# Patient Record
Sex: Female | Born: 1965
Health system: Southern US, Community
[De-identification: ages and names within clinical notes are randomized; demographics above are authoritative.]

## PROBLEM LIST (undated history)

## (undated) DIAGNOSIS — I493 Ventricular premature depolarization: Principal | ICD-10-CM

## (undated) DIAGNOSIS — F419 Anxiety disorder, unspecified: Secondary | ICD-10-CM

## (undated) DIAGNOSIS — K219 Gastro-esophageal reflux disease without esophagitis: Secondary | ICD-10-CM

## (undated) DIAGNOSIS — R112 Nausea with vomiting, unspecified: Secondary | ICD-10-CM

## (undated) DIAGNOSIS — N3941 Urge incontinence: Secondary | ICD-10-CM

## (undated) DIAGNOSIS — N3010 Interstitial cystitis (chronic) without hematuria: Secondary | ICD-10-CM

## (undated) DIAGNOSIS — IMO0002 Reserved for concepts with insufficient information to code with codable children: Secondary | ICD-10-CM

## (undated) DIAGNOSIS — R002 Palpitations: Principal | ICD-10-CM

## (undated) DIAGNOSIS — G20A1 Parkinson's disease without dyskinesia, without mention of fluctuations: Secondary | ICD-10-CM

## (undated) DIAGNOSIS — G43909 Migraine, unspecified, not intractable, without status migrainosus: Secondary | ICD-10-CM

## (undated) DIAGNOSIS — M79605 Pain in left leg: Secondary | ICD-10-CM

## (undated) DIAGNOSIS — K649 Unspecified hemorrhoids: Secondary | ICD-10-CM

## (undated) DIAGNOSIS — R0789 Other chest pain: Secondary | ICD-10-CM

## (undated) DIAGNOSIS — G20 Parkinson's disease: Secondary | ICD-10-CM

## (undated) DIAGNOSIS — J302 Other seasonal allergic rhinitis: Secondary | ICD-10-CM

## (undated) DIAGNOSIS — Z9889 Other specified postprocedural states: Secondary | ICD-10-CM

## (undated) HISTORY — DX: Unspecified hemorrhoids: K64.9

## (undated) HISTORY — DX: Other seasonal allergic rhinitis: J30.2

## (undated) HISTORY — PX: BREAST ENHANCEMENT SURGERY: SHX7

## (undated) HISTORY — DX: Parkinson's disease without dyskinesia, without mention of fluctuations: G20.A1

## (undated) HISTORY — DX: Anxiety disorder, unspecified: F41.9

## (undated) HISTORY — DX: Other chest pain: R07.89

## (undated) HISTORY — DX: Palpitations: R00.2

## (undated) HISTORY — DX: Parkinson's disease: G20

## (undated) HISTORY — DX: Ventricular premature depolarization: I49.3

## (undated) HISTORY — PX: OTHER SURGICAL HISTORY: SHX169

## (undated) HISTORY — DX: Interstitial cystitis (chronic) without hematuria: N30.10

## (undated) HISTORY — PX: LASIK: SHX215

## (undated) HISTORY — PX: TONSILLECTOMY: SUR1361

## (undated) HISTORY — DX: Reserved for concepts with insufficient information to code with codable children: IMO0002

---

## 1999-03-28 ENCOUNTER — Other Ambulatory Visit: Admission: RE | Admit: 1999-03-28 | Discharge: 1999-03-28 | Payer: Self-pay | Admitting: Gynecology

## 1999-06-07 ENCOUNTER — Other Ambulatory Visit: Admission: RE | Admit: 1999-06-07 | Discharge: 1999-06-07 | Payer: Self-pay | Admitting: Gynecology

## 2000-03-16 ENCOUNTER — Other Ambulatory Visit: Admission: RE | Admit: 2000-03-16 | Discharge: 2000-03-16 | Payer: Self-pay | Admitting: Gynecology

## 2002-04-25 ENCOUNTER — Other Ambulatory Visit: Admission: RE | Admit: 2002-04-25 | Discharge: 2002-04-25 | Payer: Self-pay | Admitting: Gynecology

## 2002-09-08 ENCOUNTER — Inpatient Hospital Stay (HOSPITAL_COMMUNITY): Admission: AD | Admit: 2002-09-08 | Discharge: 2002-09-08 | Payer: Self-pay | Admitting: Gynecology

## 2002-09-19 ENCOUNTER — Inpatient Hospital Stay (HOSPITAL_COMMUNITY): Admission: AD | Admit: 2002-09-19 | Discharge: 2002-09-19 | Payer: Self-pay | Admitting: Gynecology

## 2002-10-09 ENCOUNTER — Inpatient Hospital Stay (HOSPITAL_COMMUNITY): Admission: AD | Admit: 2002-10-09 | Discharge: 2002-10-09 | Payer: Self-pay | Admitting: Gynecology

## 2002-12-30 ENCOUNTER — Encounter: Admission: RE | Admit: 2002-12-30 | Discharge: 2002-12-30 | Payer: Self-pay | Admitting: Gynecology

## 2003-01-02 ENCOUNTER — Encounter: Admission: RE | Admit: 2003-01-02 | Discharge: 2003-01-02 | Payer: Self-pay | Admitting: Gynecology

## 2003-01-06 ENCOUNTER — Encounter: Admission: RE | Admit: 2003-01-06 | Discharge: 2003-01-06 | Payer: Self-pay | Admitting: Gynecology

## 2003-01-09 ENCOUNTER — Encounter: Admission: RE | Admit: 2003-01-09 | Discharge: 2003-01-09 | Payer: Self-pay | Admitting: Gynecology

## 2003-01-13 ENCOUNTER — Encounter: Admission: RE | Admit: 2003-01-13 | Discharge: 2003-01-13 | Payer: Self-pay | Admitting: Gynecology

## 2003-01-16 ENCOUNTER — Encounter: Admission: RE | Admit: 2003-01-16 | Discharge: 2003-01-16 | Payer: Self-pay | Admitting: Gynecology

## 2003-01-20 ENCOUNTER — Encounter: Admission: RE | Admit: 2003-01-20 | Discharge: 2003-01-20 | Payer: Self-pay | Admitting: Gynecology

## 2003-01-23 ENCOUNTER — Encounter: Admission: RE | Admit: 2003-01-23 | Discharge: 2003-01-23 | Payer: Self-pay | Admitting: Gynecology

## 2003-01-26 ENCOUNTER — Inpatient Hospital Stay (HOSPITAL_COMMUNITY): Admission: AD | Admit: 2003-01-26 | Discharge: 2003-01-29 | Payer: Self-pay | Admitting: Gynecology

## 2003-01-26 ENCOUNTER — Encounter (INDEPENDENT_AMBULATORY_CARE_PROVIDER_SITE_OTHER): Payer: Self-pay | Admitting: Specialist

## 2003-03-10 ENCOUNTER — Other Ambulatory Visit: Admission: RE | Admit: 2003-03-10 | Discharge: 2003-03-10 | Payer: Self-pay | Admitting: Gynecology

## 2004-04-21 ENCOUNTER — Other Ambulatory Visit: Admission: RE | Admit: 2004-04-21 | Discharge: 2004-04-21 | Payer: Self-pay | Admitting: Gynecology

## 2004-12-19 ENCOUNTER — Ambulatory Visit (HOSPITAL_COMMUNITY): Admission: RE | Admit: 2004-12-19 | Discharge: 2004-12-19 | Payer: Self-pay | Admitting: Urology

## 2004-12-19 ENCOUNTER — Ambulatory Visit (HOSPITAL_BASED_OUTPATIENT_CLINIC_OR_DEPARTMENT_OTHER): Admission: RE | Admit: 2004-12-19 | Discharge: 2004-12-19 | Payer: Self-pay | Admitting: Urology

## 2005-04-26 ENCOUNTER — Other Ambulatory Visit: Admission: RE | Admit: 2005-04-26 | Discharge: 2005-04-26 | Payer: Self-pay | Admitting: Gynecology

## 2005-06-09 ENCOUNTER — Encounter: Admission: RE | Admit: 2005-06-09 | Discharge: 2005-06-09 | Payer: Self-pay | Admitting: Gynecology

## 2006-04-30 ENCOUNTER — Other Ambulatory Visit: Admission: RE | Admit: 2006-04-30 | Discharge: 2006-04-30 | Payer: Self-pay | Admitting: Gynecology

## 2006-06-08 ENCOUNTER — Encounter: Admission: RE | Admit: 2006-06-08 | Discharge: 2006-09-06 | Payer: Self-pay | Admitting: Gynecology

## 2007-05-08 ENCOUNTER — Other Ambulatory Visit: Admission: RE | Admit: 2007-05-08 | Discharge: 2007-05-08 | Payer: Self-pay | Admitting: Gynecology

## 2007-08-14 ENCOUNTER — Encounter: Admission: RE | Admit: 2007-08-14 | Discharge: 2007-08-14 | Payer: Self-pay | Admitting: Gynecology

## 2008-06-01 ENCOUNTER — Other Ambulatory Visit: Admission: RE | Admit: 2008-06-01 | Discharge: 2008-06-01 | Payer: Self-pay | Admitting: Obstetrics & Gynecology

## 2008-09-10 ENCOUNTER — Ambulatory Visit (HOSPITAL_COMMUNITY): Admission: RE | Admit: 2008-09-10 | Discharge: 2008-09-10 | Payer: Self-pay | Admitting: Obstetrics & Gynecology

## 2009-06-09 ENCOUNTER — Other Ambulatory Visit: Admission: RE | Admit: 2009-06-09 | Discharge: 2009-06-09 | Payer: Self-pay | Admitting: Gynecology

## 2009-06-09 ENCOUNTER — Ambulatory Visit: Payer: Self-pay | Admitting: Gynecology

## 2009-11-22 ENCOUNTER — Ambulatory Visit (HOSPITAL_COMMUNITY): Admission: RE | Admit: 2009-11-22 | Discharge: 2009-11-22 | Payer: Self-pay | Admitting: Obstetrics and Gynecology

## 2010-04-11 ENCOUNTER — Other Ambulatory Visit: Admission: RE | Admit: 2010-04-11 | Discharge: 2010-04-11 | Payer: Self-pay | Admitting: Gynecology

## 2010-04-11 ENCOUNTER — Ambulatory Visit: Payer: Self-pay | Admitting: Gynecology

## 2010-07-24 ENCOUNTER — Encounter: Payer: Self-pay | Admitting: Internal Medicine

## 2010-11-18 NOTE — H&P (Signed)
NAME:  Kristina Lewis, Kristina Lewis                          ACCOUNT NO.:  [id number]   MEDICAL RECORD NO.:  [id number]                   PATIENT TYPE:  INP   LOCATION:  NA                                   FACILITY:  [facility]   PHYSICIAN:  Ivor Costa. Farrel Gobble, M.D.              DATE OF BIRTH:  July 11, 1965   DATE OF ADMISSION:  DATE OF DISCHARGE:                                HISTORY & PHYSICAL   CHIEF COMPLAINT:  Thirty-six-plus weeks twins for an elective repeat  cesarean section.   HISTORY OF PRESENT ILLNESS:  The patient is a 45 year old G3 P1-0-1-1 with a  history of a previous cesarean section for breech who has spontaneous  diamniotic dichorionic twins who presents for an elective cesarean section  for twin gestation.  The patient's pregnancy has been complicated by  advanced maternal age for which she did not have an amniocentesis.  She was  also noted since 24 weeks to have a markedly shortened cervix that had been  noted to be 2.4.  The patient was placed on bedrest and has had multiple  ultrasounds to look at her cervix along with estimated fetal weights and she  was noted to have variable cervical length between 1.5 and 2.4 cm.  The  patient, however, denies any contractions.  Her cervix on her last  ultrasound was 1.8 cm and closed without any funneling.  She also has  discordance between the twins although they are dichorionic.  Twin A who  is female was estimated to be in the 55th percentile, Twin B in the 30th  percentile - both using the twin charts.  The patient otherwise is without  complaints.  She reports good fetal movement, no vaginal bleeding, and no  pressure.  She also has a history of positive GBS.  Her LMP was May 17, 2002.  Her estimated date of confinement is February 22, 2003.   PRENATAL LABORATORY DATA:  She is A positive, antibody negative.  RPR  nonreactive.  Rubella immune.  Hepatitis B surface antigen nonreactive.  HIV  nonreactive.  AFP was within normal limits.   Glucola was normal at 104.   Please refer to the [facility].   PHYSICAL EXAMINATION:  GENERAL:  She is a well-appearing gravida in no acute  distress.  HEART:  Regular rate.  LUNGS:  Clear to auscultation.  ABDOMEN:  Gravid, soft, nontender.  Heart tones were auscultated.  PELVIC:  Vaginal exam was deferred.  However, cervix is 1.8 cm and closed by  ultrasound.  EXTREMITIES:  No pedal edema.   ASSESSMENT:  Thirty-six and one-seventh weeks twins with shortened cervix  since 24 weeks who presents now for an elective repeat cesarean section.  The patient does not desire a bilateral tubal ligation.  All questions were  addressed and she will present electively for surgery.  Should she have  signs or symptoms of labor prior to this  point then she will present for  section at that time.  Of note, both babies are breech.                                               Ivor Costa. Farrel Gobble, M.D.    THL/MEDQ  D:  01/21/2003  T:  01/21/2003  Job:  [id number]

## 2010-11-18 NOTE — Op Note (Signed)
Kristina Lewis, Kristina Lewis                ACCOUNT NO.:  [id number]   MEDICAL RECORD NO.:  [id number]          PATIENT TYPE:  AMB   LOCATION:  [facility]                         FACILITY:  [facility]   PHYSICIAN:  Mark C. Vernie Ammons, M.D.  DATE OF BIRTH:  [birth year]   DATE OF PROCEDURE:  12/19/2004  DATE OF DISCHARGE:                                 OPERATIVE REPORT   PREOPERATIVE DIAGNOSES:  Interstitial cystitis.   POSTOPERATIVE DIAGNOSES:  Interstitial cystitis.   PROCEDURE:  Cystoscopy with urethral dilatation and hydrodistention.   SURGEON:  Mark C. Vernie Ammons, M.D.   ANESTHESIA:  General.   BLOOD LOSS:  Less than 1 mL.   DRAINS:  None.   SPECIMENS:  None.   COMPLICATIONS:  None.   INDICATIONS:  The patient is a 45 year old white female who I have seen for  interstitial cystitis for some time. She has urinary frequency as her  primary symptom and has tried anticholinergics, but these cause significant  drying effects of her eyes and nose. She has significant nocturia as well.  Her urine is negative and she has undergone cystoscopy and hydrodistention  in the past with good result. I discussed with her treatment options and she  has and elected to proceed with hydrodistention at this time.   DESCRIPTION OF OPERATION:  After informed consent, the patient was brought  to the major OR, placed on the table, administered general anesthesia and  then moved to the  dorsal lithotomy position. Her genitalia and vagina were  sterilely prepped and draped. Initial evaluation revealed a rather tight  urethra. I dilated from 49 [place] to 24 [place] without difficulty or  bleeding and then placed a 22 French cystoscope with 12 degrees lens in the  bladder. The bladder was fully inspect and noted to be free of any tumor,  stones or inflammatory lesions. There is mild squamous trigonal metaplasia,  ureteral orifices normal configuration and position.   At 100 cmH2O pressure, the bladder was initially filled  to capacity. It was  then drained and all terminal effluent was almost clear. The bladder volume  was only 300 mL. I therefore inserted a 22 French Foley catheter and  refilled the bladder to 100 cmH2O and left it filled for approximately five  minutes. After that time, I then drained the bladder and noted the capacity  was now 400 mL. Reinspection cytoscopically revealed significant  glomerulations and petechial hemorrhages.   I then injected Kenalog and 0.25% Marcaine periurethrally and in the  subcarinal region. Kenalog, Pyridium and 0.25% Marcaine were also instilled  in the bladder. Vaginal packing was left in place and will be removed in the  recovery room.   The patient tolerated procedure well with no intraoperative complications.   I gave her a prescription for 40 Tylox and 40 Pyridium plus. She will return  to my office in four weeks to discuss additional medical management of her  condition.       MCO/MEDQ  D:  12/19/2004  T:  12/19/2004  Job:  [id number]

## 2010-11-18 NOTE — Op Note (Signed)
Kristina Lewis, Kristina Lewis                          ACCOUNT NO.:  [id number]   MEDICAL RECORD NO.:  [id number]                   PATIENT TYPE:  INP   LOCATION:  9199                                 FACILITY:  [facility]   PHYSICIAN:  Ivor Costa. Farrel Gobble, M.D.              DATE OF BIRTH:  [birth year]   DATE OF PROCEDURE:  01/26/2003  DATE OF DISCHARGE:                                 OPERATIVE REPORT   PREOPERATIVE DIAGNOSES:  1. Spontaneous twin discordant growth.  2. Previous cesarean section.  3. Shortened cervix.   POSTOPERATIVE DIAGNOSES:  1. Spontaneous twin discordant growth.  2. Previous cesarean section.  3. Shortened cervix.   PROCEDURE:  Repeat cesarean section, low flap transverse.   SURGEON:  Ivor Costa. Farrel Gobble, M.D.   ASSISTANTMarcial Pacas P. Fontaine, M.D.   ANESTHESIA:  Spinal anesthesia.   INTRAVENOUS FLUIDS:  3100 mL of lactated Ringers.   ESTIMATED BLOOD LOSS:  600 mL.   URINE OUTPUT:  100 mL of clear.   FINDINGS:  A viable female infant in the frank breech presentation, low in the  pelvis, delivered footling.  Birth weight was 6 pounds 5 ounces.  Apgars 8  and 9.  Clear amniotic fluid.  Baby B was a viable female in the frank  breech presentation.  Clear amniotic fluid.  Birth weight 6 pounds 10  ounces.  Apgars 8 and 9.  There were extensive pelvic adhesions.   COMPLICATIONS:  None.   PATHOLOGY:  Placentae.   DESCRIPTION OF PROCEDURE:  The patient was taken to the operating room where  spinal anesthesia was induced and placed in the supine position with left  lateral displacement.  She was prepped and draped in the usual sterile  fashion.  After adequate anesthesia was insured, a Pfannenstiel skin  incision was made with the scalpel going through the previous C-section  scar.  The incision was  carried through the underlying layer of fascia with  electrocautery.  The fascia was scored in the midline and the incision was  extended laterally with electrocautery over  a Kelly clamp.  The inferior  aspect of the fascial incision was grasped with Kochers.  The underlying  rectus muscles were dissected off sharply.  They were noted to be markedly  scarred.  In a similar fashion, the superior aspect of the incision was also  grasped with Kochers and underlying rectus muscles were dissected off.  They  were also noted to be markedly scarred.  The rectus muscles were separated  in the midline.  They were also markedly scarred in the midline and they  needed to be extended superiorly and inferiorly sharply.  What was felt to  be translucent area of the peritoneum was elevated and tented up.  However,  once we entered this space, we felt that perhaps we were creating the  bladder flap.  The reattempt to find the peritoneal cavity, however,  was  unsuccessful because of the extensive scarring.  The muscles were scored  slightly with cautery for approximately 2 cm on each side and this allowed  [name] more movement. The bladder blade was inserted and indeed the  vesicouterine peritoneum had been entered.  We extended this incision.  The  scalpel was used to make a transverse incision on the uterus.  The lower  segment was noted to be markedly thin.  The incision was extended laterally  with the bandage scissors.  Amniotomy was performed.  The frank breech was  deep in the pelvis and could not be completely elevated.  Therefore, leg was  advanced through the incision and the infant was delivered from the footling  breech presentation.  With the usual maneuvers, cord was cut and clamped and  handed off to the awaiting pediatricians.  Cord bloods were obtained.  The  infant was handed off to the awaiting pediatricians.  The sac of the second  twin was then visualized.  She was also felt to be in the frank breech  presentation.  Amniotomy was performed and she was delivered frank breech  with the usual maneuvers.  The cord bloods were obtained for the second  twin.  We  were unable to massage the uterus internally because of extensive  adhesions of the uterus to the anterior abdominal wall.  The placentae were  therefore manually extracted.  The uterus was clear of all clots and debris.  The uterine incision was repaired with a running locked layer of 0 chromic.  There were no extensions.  The pelvis was irrigated.  We were unable to  visualize much into the gutters.  We were unable to visualize the adnexa  secondary to scarring.  However, as everything was hemostatic, we elected to  not continue that.  The muscles were inspected and noted to be hemostatic as  was the peritoneum.  The fascia was closed with 0 Vicryl starting at the  apex and moving toward the midline bilaterally.  The subcu was irrigated and  reapproximated with interrupted 2-0 plain.  The keloid aspect of the  incision was sharply dissected off.  The incision was then closed with a  running layer of 4-0 Vicryl on a [place].  Because of the tape allergy, a  binder was then placed.  The patient tolerated the procedure well.  Sponge,  lap and needle counts correct x 2.  She was transferred to the PACU in  stable condition.                                               Ivor Costa. Farrel Gobble, M.D.    THL/MEDQ  D:  01/26/2003  T:  01/26/2003  Job:  [id number]

## 2010-11-18 NOTE — Discharge Summary (Signed)
   Kristina Lewis, Kristina Lewis                          ACCOUNT NO.:  [id number]   MEDICAL RECORD NO.:  [id number]                   PATIENT TYPE:  INP   LOCATION:  9124                                 FACILITY:  [facility]   PHYSICIAN:  Ivor Costa. Farrel Gobble, M.D.              DATE OF BIRTH:  13-Dec-1965   DATE OF ADMISSION:  01/26/2003  DATE OF DISCHARGE:  01/29/2003                                 DISCHARGE SUMMARY   DISCHARGE DIAGNOSES:  1. Intrauterine pregnancy at 36+ weeks.  2. Shortened cervix.  3. Twin gestation, discordant growth.  4. History of prior cesarean section, for repeat cesarean section.  5. Advanced maternal age.  6. Status post repeat cesarean section, low flap transverse, by Ivor Costa.     Farrel Gobble, M.D., on January 26, 2003.  7. Positive group B Streptococcus.   HISTORY:  A 45 year old female, gravida 3, para 1, with an EDC of February 22, 2003.  Prenatal course was uncomplicated with a history of prior cesarean,  desires repeat cesarean section, also with spontaneous twin gestation with  advanced maternal age.  Declined amniocentesis but a positive group B strep.  During pregnancy she was noted at 24 weeks to have a markedly shortened  cervix to be 2.4 cm, placed on bedrest, and multiple ultrasounds to follow  serial weights as well as ultrasound.  It was noted, however, there was  discordant growth of the twins; therefore, the patient was admitted for  surgery.   HOSPITAL COURSE:  On January 26, 2003, the patient was admitted and underwent a  repeat cesarean section, low flap transverse, by Dr. Douglass Rivers and  underwent delivery of a female, Apgars of 8 and 9, a weight of 4 pounds 10  ounces, and a female, Apgars of 8 and 9, a weight of 6 pounds 5 ounces,  without complication.  Postoperatively the patient did have a significant  amount of itching attributed to her PCA pump; therefore, the medication was  changed to Dilaudid and the PCA pump with seemed to significantly help.  Otherwise, she stayed afebrile, voiding, and in stable condition, and she  was thought in satisfactory condition to discharge to home on January 29, 2003,  and given [facility] Gynecology postpartum instructions.   ACCESSORY CLINICAL FINDINGS:  The patient is A-positive, rubella immune.  On  January 27, 2003, hemoglobin was 10.8.   DISPOSITION:  The patient is discharged to home to return to the office in  six weeks.  Any problem prior to that, can be seen in the office, and  received Tylox p.r.n. pain.     Susa Loffler, P.A.                    Ivor Costa. Farrel Gobble, M.D.    TSG/MEDQ  D:  02/13/2003  T:  02/14/2003  Job:  [id number]

## 2011-04-04 ENCOUNTER — Other Ambulatory Visit: Payer: Self-pay | Admitting: Gynecology

## 2011-04-04 DIAGNOSIS — Z139 Encounter for screening, unspecified: Secondary | ICD-10-CM

## 2011-04-13 ENCOUNTER — Ambulatory Visit (HOSPITAL_COMMUNITY): Payer: Self-pay

## 2011-07-18 ENCOUNTER — Ambulatory Visit (HOSPITAL_COMMUNITY): Payer: BC Managed Care – PPO

## 2011-07-18 ENCOUNTER — Other Ambulatory Visit: Payer: Self-pay | Admitting: Gynecology

## 2011-07-18 DIAGNOSIS — Z139 Encounter for screening, unspecified: Secondary | ICD-10-CM

## 2011-07-24 ENCOUNTER — Ambulatory Visit (HOSPITAL_COMMUNITY): Payer: BC Managed Care – PPO

## 2011-07-25 ENCOUNTER — Ambulatory Visit (HOSPITAL_COMMUNITY): Payer: BC Managed Care – PPO

## 2011-07-28 ENCOUNTER — Ambulatory Visit (HOSPITAL_COMMUNITY): Payer: BC Managed Care – PPO

## 2011-08-08 ENCOUNTER — Ambulatory Visit (HOSPITAL_COMMUNITY)
Admission: RE | Admit: 2011-08-08 | Discharge: 2011-08-08 | Disposition: A | Discharge status: Home / Self Care | Payer: BC Managed Care – PPO | Source: Ambulatory Visit | Attending: Gynecology | Admitting: Gynecology

## 2011-08-08 DIAGNOSIS — Z1231 Encounter for screening mammogram for malignant neoplasm of breast: Secondary | ICD-10-CM | POA: Insufficient documentation

## 2011-08-08 DIAGNOSIS — Z139 Encounter for screening, unspecified: Secondary | ICD-10-CM

## 2011-08-17 ENCOUNTER — Ambulatory Visit (INDEPENDENT_AMBULATORY_CARE_PROVIDER_SITE_OTHER): Payer: BC Managed Care – PPO | Admitting: Gynecology

## 2011-08-17 ENCOUNTER — Encounter: Payer: Self-pay | Admitting: Gynecology

## 2011-08-17 VITALS — BP 106/68 | Ht 63.5 in | Wt 124.0 lb

## 2011-08-17 DIAGNOSIS — Z833 Family history of diabetes mellitus: Secondary | ICD-10-CM

## 2011-08-17 DIAGNOSIS — Z0000 Encounter for general adult medical examination without abnormal findings: Secondary | ICD-10-CM

## 2011-08-17 DIAGNOSIS — Z01419 Encounter for gynecological examination (general) (routine) without abnormal findings: Secondary | ICD-10-CM

## 2011-08-17 DIAGNOSIS — N898 Other specified noninflammatory disorders of vagina: Secondary | ICD-10-CM

## 2011-08-17 DIAGNOSIS — N3010 Interstitial cystitis (chronic) without hematuria: Secondary | ICD-10-CM | POA: Insufficient documentation

## 2011-08-17 DIAGNOSIS — R634 Abnormal weight loss: Secondary | ICD-10-CM

## 2011-08-17 LAB — WET PREP FOR TRICH, YEAST, CLUE: Yeast Wet Prep HPF POC: NONE SEEN

## 2011-08-17 LAB — URINALYSIS W MICROSCOPIC + REFLEX CULTURE
Bilirubin Urine: NEGATIVE
Glucose, UA: NEGATIVE mg/dL
Ketones, ur: NEGATIVE mg/dL
Protein, ur: NEGATIVE mg/dL
Urobilinogen, UA: 0.2 mg/dL (ref 0.0–1.0)

## 2011-08-17 MED ORDER — CLINDAMYCIN PHOSPHATE 2 % VA CREA: 1.0000 | TOPICAL_CREAM | Freq: Every day | VAGINAL | Status: AC

## 2011-08-17 NOTE — Patient Instructions (Signed)
Osteoporosis Osteoporosis is a disease of the bones that makes them weaker and prone to break (fracture). By their mid-30s, most people begin to gradually lose bone strength. If this is severe enough, osteoporosis may occur. Osteopenia is a less severe weakness of the bones, which places you at risk for osteoporosis. It is important to identify if you have osteoporosis or osteopenia. Bone fractures from osteoporosis (especially hip and spine fractures) are a major cause of hospitalization, loss of independence, and can lead to life-threatening complications. CAUSES  There are a number of causes and risk factors:  Gender. Women are at a higher risk for osteoporosis than men.   Age. Bone formation slows down with age.   Ethnicity. For unclear reasons, white and Asian women are at higher risk for osteoporosis. Hispanic and African American women are at increased, but lesser, risk.   Family history of osteoporosis can mean that you are at a higher risk for getting it.   History of bone fractures indicates you may be at higher risk of another.   Calcium is very important for bone health and strength. Not enough calcium in your diet increases your risk for osteoporosis. Vitamin D is important for calcium metabolism. You get vitamin D from sunlight, foods, or supplements.   Physical activity. Bones get stronger with weight-bearing exercise and weaker without use.   Smoking is associated with decreased bone strength.   Medicines. Cortisone medicines, too much thyroid medicine, some cancer and seizure medicines, and others can weaken bones and cause osteoporosis.   Decreased body weight is associated with osteoporosis. The small amount of estrogen-type molecules produced in fat cells seems to protect the bones.   Menopausal decrease in the hormone estrogen can cause osteoporosis.   Low levels of the hormone testosterone can cause osteoporosis.   Some medical conditions can lead to osteoporosis  (hyperthyroidism, hyperparathyroidism, B12 deficiency).  SYMPTOMS  Usually, no symptoms are felt as the bones weaken. The first symptoms are generally related to bone fractures. You may have silent, tiny bone fractures, especially in your spine. This can cause height loss and forward bending of the spine (kyphosis). DIAGNOSIS  You or your caregiver may suspect osteoporosis based on height loss and kyphosis. Osteoporosis or osteopenia may be identified on an X-ray done for other reasons. A bone density measurement will likely be taken. Your bones are often measured at your lower spine or your hips. Measurement is done by an X-ray called a DEXA scan, or sometimes by a computerized X-ray scan (CT or CAT scan). Other tests may be done to find the cause of osteoporosis, such as blood tests to measure calcium and vitamin D, or to monitor treatment.

## 2011-08-17 NOTE — Progress Notes (Signed)
Kristina Lewis 21-Jun-1966 [id number]   History:    46 y.o.  for annual exam with history of Mirena IUD placed in August of 2008 not have any menstrual periods. Review of patient's records indicated she was weighing 148 down to 124 which she attributes to exercise a need better. Patient was complaining of vulvar pruritus she been treated recently for an upper as per tract infection and subsequently developed what she thought was a yeast infection and her primary physician and given her Diflucan patient with some relief but still has some vulvar irritation.  Past medical history,surgical history, family history and social history were all reviewed and documented in the EPIC chart.  Gynecologic History No LMP recorded. Patient is not currently having periods (Reason: IUD). Contraception: IUD Last Pap: 2011. Results were: normal Last mammogram: February 2013. Results were: Normal   Obstetric History OB History    Grav Para Term Preterm Abortions TAB SAB Ect Mult Living   3 2 2      1 3      # Outc Date GA Lbr Len/2nd Wgt Sex Del Anes PTL Lv   1A TRM     F CS  No Yes   1B      M CS  No Yes   2 TRM     M CS  No Yes   3 GRA                ROS:  Was performed and pertinent positives and negatives are included in the history.  Exam: chaperone present  BP 106/68  Ht 5' 3.5" (1.613 m)  Wt 124 lb (56.246 kg)  BMI 21.62 kg/m2  Body mass index is 21.62 kg/(m^2).  General appearance : Well developed well nourished female. No acute distress HEENT: Neck supple, trachea midline, no carotid bruits, no thyroidmegaly Lungs: Clear to auscultation, no rhonchi or wheezes, or rib retractions  Heart: Regular rate and rhythm, no murmurs or gallops Breast:Examined in sitting and supine position were symmetrical in appearance, no palpable masses or tenderness,  no skin retraction, no nipple inversion, no nipple discharge, no skin discoloration, no axillary or supraclavicular lymphadenopathy Abdomen: no  palpable masses or tenderness, no rebound or guarding Extremities: no edema or skin discoloration or tenderness  Pelvic:  Bartholin, Urethra, Skene Glands: Within normal limits             Vagina: No gross lesions or discharge  Cervix: No gross lesions or discharge  Uterus  retroverted, normal size, shape and consistency, non-tender and mobile  Adnexa  Without masses or tenderness  Anus and perineum  normal   Rectovaginal  normal sphincter tone without palpated masses or tenderness             Hemoccult not done   Wet prep moderate bacteria  Assessment/Plan:  46 y.o. female for annual exam . We discussed new screening guidelines her Pap smears. Her next Pap smear will be next year. She was encouraged to do her monthly self breast examination. She will be placed on Cleocin vaginal cream to apply each bedtime for 5 days. We discussed importance of calcium and vitamin D and regular exercise for osteoporosis prevention. Literature information was provided osteoporosis prevention. Patient with strong family history diabetes for this reason we'll be checking fasting blood sugar alone the fasting lipid profile TSH CBC and urinalysis. Patient is to return back to the office in July to have her IUD changed.    Ok Edwards MD, 10:24 AM  08/17/2011     

## 2011-08-18 LAB — LIPID PANEL
Cholesterol: 211 mg/dL — ABNORMAL HIGH (ref 0–200)
HDL: 106 mg/dL (ref >39)
Total CHOL/HDL Ratio: 2 Ratio
Triglycerides: 52 mg/dL (ref <150)
VLDL: 10 mg/dL (ref 0–40)

## 2011-08-18 LAB — CBC WITH DIFFERENTIAL/PLATELET
Basophils Absolute: 0 10*3/uL (ref 0.0–0.1)
Basophils Relative: 0 % (ref 0–1)
Eosinophils Absolute: 0 10*3/uL (ref 0.0–0.7)
Eosinophils Relative: 1 % (ref 0–5)
HCT: 44.8 % (ref 36.0–46.0)
MCH: 31.5 pg (ref 26.0–34.0)
MCHC: 31.9 g/dL (ref 30.0–36.0)
MCV: 98.7 fL (ref 78.0–100.0)
Monocytes Absolute: 0.4 10*3/uL (ref 0.1–1.0)
RDW: 13.4 % (ref 11.5–15.5)

## 2011-08-18 LAB — TSH: TSH: 2.665 u[IU]/mL (ref 0.350–4.500)

## 2011-10-09 ENCOUNTER — Telehealth: Payer: Self-pay | Admitting: *Deleted

## 2011-10-09 NOTE — Telephone Encounter (Signed)
Please call in Diflucan to take one tablet today then one tablet on Wednesday with one refill.

## 2011-10-09 NOTE — Telephone Encounter (Signed)
Pt called c/o yeast infection from last OV. Pt was given Cleocin vaginal cream to apply each bedtime for 5 days, pt took all medication and itching, white discharge still there. Pt cant make OV due to sick child at home. Please advise

## 2011-10-09 NOTE — Telephone Encounter (Signed)
Pt informed with the below note, rx called into pharmacy. [phone]

## 2011-11-24 ENCOUNTER — Telehealth: Payer: Self-pay | Admitting: *Deleted

## 2011-11-24 MED ORDER — FLUCONAZOLE 200 MG PO TABS: 200.0000 mg | ORAL_TABLET | Freq: Every day | ORAL | Status: DC

## 2011-11-24 MED ORDER — CLINDAMYCIN PHOSPHATE 2 % VA CREA: TOPICAL_CREAM | VAGINAL | Status: DC

## 2011-11-24 NOTE — Telephone Encounter (Signed)
Pt calling c/o recurrent yeast infection x 3 days now, pt has treatment on feb 2013 with Cleocin vaginal cream to apply each bedtime for 5 days. And then on diflucan on April 2013, pt would just like something to help treat for weekend only, OV made for 11/28/10. Please advise

## 2011-11-24 NOTE — Telephone Encounter (Signed)
Pt informed with the below note, rx sent to pharmacy. 

## 2011-11-24 NOTE — Telephone Encounter (Signed)
Please call in Diflucan qoomg one PO and Cleocin vaginal cream qhs for 5 days.

## 2011-11-28 ENCOUNTER — Encounter: Payer: Self-pay | Admitting: Gynecology

## 2011-11-28 ENCOUNTER — Ambulatory Visit (INDEPENDENT_AMBULATORY_CARE_PROVIDER_SITE_OTHER): Payer: BC Managed Care – PPO | Admitting: Gynecology

## 2011-11-28 VITALS — BP 110/70

## 2011-11-28 DIAGNOSIS — L293 Anogenital pruritus, unspecified: Secondary | ICD-10-CM

## 2011-11-28 DIAGNOSIS — R300 Dysuria: Secondary | ICD-10-CM

## 2011-11-28 DIAGNOSIS — N951 Menopausal and female climacteric states: Secondary | ICD-10-CM

## 2011-11-28 DIAGNOSIS — N898 Other specified noninflammatory disorders of vagina: Secondary | ICD-10-CM

## 2011-11-28 LAB — URINALYSIS W MICROSCOPIC + REFLEX CULTURE
Bilirubin Urine: NEGATIVE
Glucose, UA: NEGATIVE mg/dL
Hgb urine dipstick: NEGATIVE
Protein, ur: NEGATIVE mg/dL
pH: 7.5 (ref 5.0–8.0)

## 2011-11-28 LAB — WET PREP FOR TRICH, YEAST, CLUE
Clue Cells Wet Prep HPF POC: NONE SEEN
WBC, Wet Prep HPF POC: NONE SEEN
Yeast Wet Prep HPF POC: NONE SEEN

## 2011-11-28 LAB — FOLLICLE STIMULATING HORMONE: FSH: 10 m[IU]/mL

## 2011-11-28 MED ORDER — METRONIDAZOLE 500 MG PO TABS: 500.0000 mg | ORAL_TABLET | Freq: Two times a day (BID) | ORAL | Status: AC

## 2011-11-28 NOTE — Progress Notes (Addendum)
Patient 46 year old who presented to the office today complaining of leukorrhea suprapubic discomfort and vulvar irritation along with frequency and dysuria. Patient is being followed by the urologist for her interstitial cystitis and her overactive bladder for which she was recently started on some anticholinergic that she does not recall the name. She denies any fever chills nausea or vomiting. She is complaining of night sweats as well as weight be informed and temperature instability. She has had a Mirena IUD in place since August of 2008.  Exam: Abdomen: Soft nontender no rebound or guarding Pelvic: Bartholin urethra Skene glands within normal limits : Vagina vaginal gel noted in the vagina Cervix: IUD string not seen Uterus slightly anteverted no palpable masses or tenderness Rectal: Not examined  Assessment/plan: Patient complaining of leukorrhea a mucus-like discharge. We discussed that sometimes this may happen with patient with IUD. She would like to have it removed. We did not see the IUD string today. She will return back to the office later in the week for ultrasound and to remove the IUD at the same time. The wet prep today just demonstrated too numerous to count bacteria. A GC and Chlamydia culture was obtained pending at time of this dictation. We'll also check her TSH as well as an FSH and random blood sugar today. When she returns this week for the ultrasound we'll discuss these results and manage accordingly. She will be started on Flagyl 500 mg to take 1 by mouth twice a day for 5 days.  Addendum: Urinalysis was negative. Urine culture pending at time of this dictation.

## 2011-11-28 NOTE — Patient Instructions (Signed)

## 2011-11-29 ENCOUNTER — Other Ambulatory Visit: Payer: Self-pay | Admitting: Gynecology

## 2011-11-29 DIAGNOSIS — T8339XA Other mechanical complication of intrauterine contraceptive device, initial encounter: Secondary | ICD-10-CM

## 2011-11-29 LAB — GC/CHLAMYDIA PROBE AMP, GENITAL: GC Probe Amp, Genital: NEGATIVE

## 2011-11-30 ENCOUNTER — Ambulatory Visit (INDEPENDENT_AMBULATORY_CARE_PROVIDER_SITE_OTHER): Payer: BC Managed Care – PPO

## 2011-11-30 ENCOUNTER — Other Ambulatory Visit: Payer: Self-pay | Admitting: Gynecology

## 2011-11-30 ENCOUNTER — Ambulatory Visit (INDEPENDENT_AMBULATORY_CARE_PROVIDER_SITE_OTHER): Payer: BC Managed Care – PPO | Admitting: Gynecology

## 2011-11-30 DIAGNOSIS — Z309 Encounter for contraceptive management, unspecified: Secondary | ICD-10-CM

## 2011-11-30 DIAGNOSIS — N831 Corpus luteum cyst of ovary, unspecified side: Secondary | ICD-10-CM

## 2011-11-30 DIAGNOSIS — T8339XA Other mechanical complication of intrauterine contraceptive device, initial encounter: Secondary | ICD-10-CM

## 2011-11-30 DIAGNOSIS — T8332XA Displacement of intrauterine contraceptive device, initial encounter: Secondary | ICD-10-CM

## 2011-11-30 DIAGNOSIS — Z30431 Encounter for routine checking of intrauterine contraceptive device: Secondary | ICD-10-CM

## 2011-11-30 DIAGNOSIS — N854 Malposition of uterus: Secondary | ICD-10-CM

## 2011-11-30 DIAGNOSIS — Z3049 Encounter for surveillance of other contraceptives: Secondary | ICD-10-CM

## 2011-11-30 DIAGNOSIS — Z30432 Encounter for removal of intrauterine contraceptive device: Secondary | ICD-10-CM

## 2011-11-30 MED ORDER — NORETHIN-ETH ESTRAD-FE BIPHAS 1 MG-10 MCG / 10 MCG PO TABS: 1.0000 | ORAL_TABLET | Freq: Every day | ORAL | Status: DC

## 2011-11-30 NOTE — Progress Notes (Signed)
Patient presented to the office today for an ultrasound-assisted retrieval of IUD. Patient is complaining of leukorrhea vulvar irritation and and recently was placed on Flagyl 500 mg twice a day. She was having some infrequent and vasomotor like symptoms and an FSH was drawn along with her TSH for which both were normal. Her GC and Chlamydia culture also negative.  Ultrasound today: Uterus measured 12.4 x 5.5 x 3.6 cm with endometrial stripe of 3.7 mm the IUD was seen in the endometrial cavity right ovary was normal left ovary had a thick walled corpus luteum cyst with some slight color flow in the periphery no fluid in the cul-de-sac.  Procedure: The cervix was cleansed with Betadine solution and a single-tooth tenaculum was placed on the anterior cervical lip. With the use of the [place] clamp and transabdominal ultrasound the IUD was able to be visualized and retrieved shown to the patient and discarded.  Assessment/plan: Patient will be placed on low low Estrin 28 day oral contraceptive pill for contraception and to help with her mild perimenopausal symptoms. She will start the oral contraceptive pill today. She denies any family history of any thrombophilia and patient does not smoke. The risks benefits and pros and cons were discussed include DVT. Patient fully understands and accepts and we'll follow accordingly

## 2011-12-22 ENCOUNTER — Telehealth: Payer: Self-pay | Admitting: *Deleted

## 2011-12-22 NOTE — Telephone Encounter (Signed)
(  pt aware you are out of the office) Pt was giving Lo Loestrin Fe on 11/30/11, she is c/o migraines while taking pills, she is taking them daily as directed same time. She is taking medication preventive medicine for migraines, which doesn't help because of pills. Pt would like to switch to another pill. Please advise

## 2011-12-24 NOTE — Telephone Encounter (Signed)
Please inform patient is on the lowest estrogen containing OCP. Have her finish current pill pack and withdraw and on 2 day of menses start the progesterone only OCP and we will se if this triggers any further headaches. Please call in Micronor 28 Day OCP. #1  month supply 11 refills.

## 2011-12-27 ENCOUNTER — Other Ambulatory Visit: Payer: Self-pay | Admitting: *Deleted

## 2011-12-27 MED ORDER — NORETHINDRONE 0.35 MG PO TABS: 1.0000 | ORAL_TABLET | Freq: Every day | ORAL | Status: DC

## 2011-12-27 NOTE — Telephone Encounter (Signed)
Spoke with pt regarding the below note, she hasn't had cycle in years. Pt just had IUD removed on 11/30/11. She takes her last pill today. Please advise

## 2011-12-27 NOTE — Telephone Encounter (Signed)
Finish last pill from current pack and wait until period starts and on 2 day start new pill. Meanwhile use condoms until start of next period. If she does not get a spontaneous period by 2 weeks from finishing last pill she should do home pregnancy test and if negative take (Please call in prescription) provera 10 mg for 5-10 days to start a period and then on 2nd day start new pill.

## 2011-12-28 NOTE — Telephone Encounter (Signed)
Left message pt to call.  °

## 2011-12-28 NOTE — Telephone Encounter (Signed)
Pt informed with all the below note, and will call if no period in 2 weeks from last pill, new rx for birth control sent yesterday.

## 2012-01-09 ENCOUNTER — Telehealth: Payer: Self-pay | Admitting: *Deleted

## 2012-01-09 MED ORDER — MEDROXYPROGESTERONE ACETATE 10 MG PO TABS: ORAL_TABLET | ORAL | Status: DC

## 2012-01-09 NOTE — Telephone Encounter (Signed)
Follow up telephone encounter 12/22/11 Pt called requesting provera 10 mg tablet 1 po for 5-10 days to start cycle, pt said no cycle yet. rx sent

## 2012-01-09 NOTE — Telephone Encounter (Signed)
Pt took 2 upt and both were negative. Per JF call in rx

## 2012-02-14 ENCOUNTER — Telehealth: Payer: Self-pay | Admitting: *Deleted

## 2012-02-14 NOTE — Telephone Encounter (Signed)
Pt informed with the below note. Transferred to appointe ment desk.

## 2012-02-14 NOTE — Telephone Encounter (Signed)
Please have her stop the pill after today. And I will need to see her tomorrow in the office to go over her medical history in May changes on her oral contraceptive pill and examine her.

## 2012-02-14 NOTE — Telephone Encounter (Signed)
Pt is c/o irregular bleeding had cycle 3 weeks ago, then cycle again on 02/07/12 bleed until 02/12/12 then stopped for 1 day then this am bleeding again.she takes Micronor 28 daily Pt said that bleeding is heavier in the am then in pm light. She is also c/o being very moody and ill with people and throughout the day. Please advise

## 2012-02-15 ENCOUNTER — Encounter: Payer: Self-pay | Admitting: Gynecology

## 2012-02-15 ENCOUNTER — Ambulatory Visit (INDEPENDENT_AMBULATORY_CARE_PROVIDER_SITE_OTHER): Payer: BC Managed Care – PPO | Admitting: Gynecology

## 2012-02-15 VITALS — BP 110/70

## 2012-02-15 DIAGNOSIS — N921 Excessive and frequent menstruation with irregular cycle: Secondary | ICD-10-CM | POA: Insufficient documentation

## 2012-02-15 DIAGNOSIS — N920 Excessive and frequent menstruation with regular cycle: Secondary | ICD-10-CM

## 2012-02-15 DIAGNOSIS — N318 Other neuromuscular dysfunction of bladder: Secondary | ICD-10-CM

## 2012-02-15 DIAGNOSIS — N3281 Overactive bladder: Secondary | ICD-10-CM

## 2012-02-15 DIAGNOSIS — G8929 Other chronic pain: Secondary | ICD-10-CM | POA: Insufficient documentation

## 2012-02-15 DIAGNOSIS — R51 Headache: Secondary | ICD-10-CM

## 2012-02-15 DIAGNOSIS — N898 Other specified noninflammatory disorders of vagina: Secondary | ICD-10-CM | POA: Insufficient documentation

## 2012-02-15 DIAGNOSIS — G4700 Insomnia, unspecified: Secondary | ICD-10-CM

## 2012-02-15 DIAGNOSIS — N946 Dysmenorrhea, unspecified: Secondary | ICD-10-CM | POA: Insufficient documentation

## 2012-02-15 LAB — PROLACTIN: Prolactin: 5.9 ng/mL

## 2012-02-15 MED ORDER — ZALEPLON 10 MG PO CAPS: 10.0000 mg | ORAL_CAPSULE | Freq: Every day | ORAL | Status: DC

## 2012-02-15 NOTE — Progress Notes (Addendum)
Patient is a 46 year old with long-standing history of leukorrhea and for this reason she had an IUD removed on the 30th of 2012. She continues with this leukorrhea even after removing the IUD. She had had some mild vasomotor symptoms and earlier this year she had an [facility] which was normal along with a TSH. She has suffer from dysmenorrhea and her cycles and last up to 7 days at a time. She's had 2 prior cesarean sections in the past. She is currently being followed by the urologist Dr. 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 She has informed me that she is in the process of being considered for InterStim placement to help with the symptoms. Patient is currently on an anticholinergic that she could not recall the name She presented to the office today complaining of menometrorrhagia. She was recently switched to a second oral contraceptive pill one being progesterone only to see if this will alleviate also her headaches.  Exam: Abdomen soft nontender no rebound or guarding Pelvic: Bartholin urethra Skene was within normal limits Vagina: No lesions or discharge Cervix: No lesions or discharge Uterus: Anteverted normal size shape and consistency Adnexa: No palpable masses or tenderness Rectal exam: Deferred  Assessment/plan: Patient with long term history of leukorrhea with or without IUD. Has had issues with dysmenorrhea and menorrhagia in the past. Recently switched to a progesterone only oral contraceptive pill for contraception and to see if he would decrease her frequency of headaches. She is also suffering from interstitial cystitis and detrusor dyssynergia for which urologist is currently working with her on. I've asked her to continue on her current pills since it was just recently changed and continue to monitor her cycles. Since she continues with the symptoms of chronic leukorrhea dysmenorrhea and menorrhagia we did discuss consideration of a total  laparoscopic hysterectomy with ovarian conservation and perhaps at the same time the urologist may want to proceed with placing the InterStim at that time. I will place a call in to  Dr. Sherron Monday to see what his thoughts are. We'll check her prolactin level today. Literature formation was provided. She will return back next week for further discussion. She had a normal GYN ultrasound in may of this year with the exception of a small right corpus luteum cyst. (Physiological). Urine pregnancy test was negative today.

## 2012-02-15 NOTE — Patient Instructions (Signed)
Total Laparoscopic Hysterectomy A total laparoscopic hysterectomy is a minimally invasive surgery to remove your uterus and cervix. This surgery is performed by making several small cuts (incisions) in your abdomen. It can also be done with a thin, lighted tube (laparoscope) inserted into 2 small incisions in the lower abdomen. Your fallopian tubes and ovaries can be removed (bilateral salpingo-oopherectomy) during this surgery as well.If a total laparoscopic hysterectomy is started and it is not safe to continue, the laparoscopic surgery will be converted to an open abdominal surgery. You will not have menstrual periods or be able to get pregnant after having this surgery. If a bilateral salpingo-oopherectomy was performed before menopause, you will go through a sudden (abrupt) menopause. This can be helped with hormone medicines. Benefits of minimally invasive surgery include:  Less pain.   Less risk of blood loss.   Less risk of infection.   Quicker return to normal activities.   Usually a 1 night stay in the hospital.   Overall patient satisfaction.  LET YOUR CAREGIVER KNOW ABOUT:  Any history of abnormal Pap tests.   Allergies to food or medicine.   Medicines taken, including vitamins, herbs, eyedrops, over-the-counter medicines, and creams.   Use of steroids (by mouth or creams).   Previous problems with anesthetics or numbing medicines.   History of bleeding problems or blood clots.   Previous surgery.   Other health problems, including diabetes and kidney problems.   Desire for future fertility.   Any infections or colds you may have developed.   Symptoms of irregular or heavy periods, weight loss, or urinary or bowel changes.  RISKS AND COMPLICATIONS   Bleeding.   Blood clots in the legs or lung.   Infection.   Injury to surrounding organs.   Problems with anesthesia.   Early menopause symptoms (hot flashes, night sweats, insomnia).   Risk of conversion  to an open abdominal incision.  BEFORE THE PROCEDURE  Ask your caregiver about changing or stopping your regular medicines.   Do not take aspirin or blood thinners (anticoagulants) for 1 week before the surgery, or as told by your caregiver.   Do not eat or drink anything for 8 hours before the surgery, or as told by your caregiver.   Quit smoking if you smoke.   Arrange for a ride home after surgery and for someone to help you at home during recovery.  PROCEDURE   You will be given antibiotic medicine.   An intravenous (IV) line will be placed in your arm. You will be given medicine to make you sleep (general anesthetic).   A gas (carbon dioxide) will be used to inflate your abdomen. This will allow your surgeon to look inside your abdomen, perform your surgery, and treat any other problems found if necessary.   Three or four small incisions (often less than  inch) will be made in your abdomen. One of these incisions will be made in the area of your belly button (navel). The laparoscope will be inserted into the incision. Your surgeon will look through the laparoscope while doing your procedure.   Other surgical instruments will be inserted through the other incisions.   The uterus may be removed through the vagina or cut into small pieces and removed through the small incisions.   Your incisions will be closed.  AFTER THE PROCEDURE  The gas will be released from inside your abdomen.   You will be taken to the recovery area where a nurse will watch and   check your progress. Once you are awake, stable, and taking fluids well, without other problems, you will return to your room or be allowed to go home.   There is usually minimal discomfort following the surgery because the incisions are so small.   You will be given pain medicine while you are in the hospital and for when you go home.   Try to have someone with you the first 3 to 5 days after you go home.   Follow up with  your surgeon in 2 to 4 weeks after surgery to evaluate your progress.  Document Released: 04/16/2007 Document Revised: 06/08/2011 Document Reviewed: 02/03/2011 [employer] Patient Information 2012 [employer]. 

## 2012-02-23 ENCOUNTER — Institutional Professional Consult (permissible substitution): Payer: BC Managed Care – PPO | Admitting: Gynecology

## 2012-03-21 ENCOUNTER — Telehealth: Payer: Self-pay | Admitting: *Deleted

## 2012-03-21 NOTE — Telephone Encounter (Signed)
Pt called and had appointment with Dr. Iona Coach regarding surgery. Pt asked if you have spoke with him about this yet? Please advise

## 2012-03-21 NOTE — Telephone Encounter (Signed)
I have not spoken with Dr. Jorje Guild. Please see if you can get him on the phone either tomorrow or Monday stockings take with him. Has she made an appointment with him yet?

## 2012-03-22 ENCOUNTER — Institutional Professional Consult (permissible substitution): Payer: BC Managed Care – PPO | Admitting: Gynecology

## 2012-03-26 NOTE — Telephone Encounter (Signed)
See if we can get Dr. Jorje Guild for me tomorrow. His mobile number is [phone] in reference to this patient.

## 2012-03-26 NOTE — Telephone Encounter (Signed)
Left message for Dr.McDairmid to call.

## 2012-03-29 NOTE — Telephone Encounter (Signed)
I have been waiting for over a week to speak with Dr. Iona Coach urologist. Could you please contact his office and let them know we have been waiting to speak with him for over a week in reference to this mutual patient. I need to speak with him on Monday. Thanks

## 2012-04-01 NOTE — Telephone Encounter (Signed)
Dr. Lily Peer asked me to try to reach Dr. McDiarmid again.  I called his office and did relay that Dr. Glenetta Hew had left several messages including one on his cell phone but has not yet heard from him. They do know it is about mutual patient.

## 2012-04-01 NOTE — Telephone Encounter (Signed)
Have we been able to contact Dr. Gevena Mart in reference to this patient? I need to speak with them.

## 2012-04-01 NOTE — Telephone Encounter (Signed)
Please inform patient that I spoke with Dr. Hinton Rao late Monday. I need for her to make appointment to see me so we can  go over her surgery and do her preop exam.Dr. McDarmid feels that the interstim may need to be placed at a different time to prevent infection.

## 2012-04-02 NOTE — Telephone Encounter (Signed)
Please check that she received my message from yesterday in reference to make an appointment for this patient to see me to discuss upcoming surgery.

## 2012-04-02 NOTE — Telephone Encounter (Signed)
Pt informed with the below note. 

## 2012-07-31 ENCOUNTER — Telehealth: Payer: Self-pay | Admitting: *Deleted

## 2012-07-31 NOTE — Telephone Encounter (Signed)
Pt called c/o h/o hemorrhoids and no thinks she has a fissure but also feels a knot or cyst. I advised OV. Apt made [employer]

## 2012-08-01 ENCOUNTER — Ambulatory Visit (INDEPENDENT_AMBULATORY_CARE_PROVIDER_SITE_OTHER): Payer: BC Managed Care – PPO | Admitting: Gynecology

## 2012-08-01 ENCOUNTER — Encounter: Payer: Self-pay | Admitting: Gynecology

## 2012-08-01 VITALS — BP 114/70

## 2012-08-01 DIAGNOSIS — K5900 Constipation, unspecified: Secondary | ICD-10-CM | POA: Insufficient documentation

## 2012-08-01 DIAGNOSIS — K625 Hemorrhage of anus and rectum: Secondary | ICD-10-CM

## 2012-08-01 DIAGNOSIS — K644 Residual hemorrhoidal skin tags: Secondary | ICD-10-CM | POA: Insufficient documentation

## 2012-08-01 MED ORDER — LIDOCAINE-HYDROCORTISONE ACE 2.8-0.55 % RE GEL: 20.0000 g | Freq: Three times a day (TID) | RECTAL | Status: DC | PRN

## 2012-08-01 NOTE — Patient Instructions (Addendum)

## 2012-08-01 NOTE — Progress Notes (Signed)
47 year old patient presented to the office today complaining of anal pressure and questionable hemorrhoid. She also had some pruritus in the anal region there was question whether she may have a fissure. She stated that she was applying cornstarch to her lower bottom and back to help with episodes of sweating that she experiences at night? At time she suffers from constipation. She denies any hematochezia but although which each straining sometimes 2 minus low blood in the total paper.  Patient underwent an anoscopic examination whereby she was placed in the knee-chest position and the endoscope was inserted. There was no evidence of internal hemorrhoids internal sphincter intact a small non-thrombosed hemorrhoid was noted at the 7:00 position a very small clot was present but very soft. There was no evidence of any anal fissure.  Assessment/plan: Patient with small external non-thrombosed hemorrhoid. Patient will be placed on Recta Gel HC to apply 3 times a day for the next 4-5 days. For her constipation she will be placed on MiraLax 1 tablespoon with juice or water daily.

## 2012-08-02 ENCOUNTER — Telehealth: Payer: Self-pay | Admitting: *Deleted

## 2012-08-02 MED ORDER — HYDROCORTISONE ACETATE 25 MG RE SUPP: RECTAL | Status: DC

## 2012-08-02 NOTE — Telephone Encounter (Signed)
Kristina Lewis called from pharmacy called stating Lidocaine-Hydrocortisone Ace 2.8-0.55 % GEL has been D/C'd. Pt will need another Rx. Please advise

## 2012-08-02 NOTE — Telephone Encounter (Signed)
Please call in [facility] suppository for  her to apply one rectally 2-3 times daily for one to 2 weeks. Prescribed 10 refill x3

## 2012-08-02 NOTE — Telephone Encounter (Signed)
rx sent

## 2012-10-02 ENCOUNTER — Encounter (HOSPITAL_BASED_OUTPATIENT_CLINIC_OR_DEPARTMENT_OTHER): Payer: Self-pay | Admitting: *Deleted

## 2012-10-02 ENCOUNTER — Other Ambulatory Visit: Payer: Self-pay | Admitting: Urology

## 2012-10-04 ENCOUNTER — Encounter (HOSPITAL_BASED_OUTPATIENT_CLINIC_OR_DEPARTMENT_OTHER): Payer: Self-pay | Admitting: *Deleted

## 2012-10-04 NOTE — Progress Notes (Signed)
NPO AFTER MN. ARRIVES AT 0845. NEEDS HG AND URINE PREG.

## 2012-10-07 NOTE — H&P (Signed)
History of Present Illness   Kristina Lewis has interstitial cystitis. Her right lower quadrant pain is much better after rescue treatments with Marcaine. By history she responded to DMSO in the past. I do not think she has tried true polypharmacy and/or Elmiron. She loves PTNS but there is some insurance issues in this regard.  She continues to void very frequently during the day and night. She has failed Myrbetriq.   Review of systems: No change in bowel or neurologic status.   I talked to her about antimuscarinics and Interstim.   Pros, cons, success and failure rates of Interstim were discussed. We talked about the test stimulation (office/operating room) and the second stage procedure. Risks were described but not limited to the risk of persistent, de novo, or worsening incontinence. Risks of pain, bleeding, infection, and neuropathy were discussed. Risk of malfunction, migration, and breakage were discussed. Trouble-shooting, battery life, and the need for explanation and reoperation were discussed. MRI issues were discussed. The patient understands that she might not reach her treatment goal and that she might be worse following surgery.    Past Medical History Problems  1. History of  Anxiety (Symptom) 799.2 2. History of  Arthritis V13.4 3. History of  Esophageal Reflux 530.81 4. History of  Hypercholesterolemia 272.0  Surgical History Problems  1. History of  Breast Surgery 2. History of  Cesarean Section 3. History of  Corneal LASIK Bilateral 4. History of  Cystoscopy With Dilation Of Bladder 5. History of  Cystoscopy With Dilation Of Bladder 6. History of  Tonsillectomy  Current Meds 1. ALPRAZolam 0.25 MG Oral Tablet; Therapy: 12Jan2013 to 2. Bupivacaine HCl 0.5 % Injection Solution; INSTILL 25 ML INTO BLADDER FOR TREATMENT;  Therapy: 08Jul2013 to (Evaluate:03Aug2013)  Requested for: 08Jul2013; Last Rx:08Jul2013 3. Claritin TABS; Therapy: (Recorded:30Nov2007) to 4.  Ibuprofen CAPS; Therapy: (Recorded:30Nov2007) to 5. Loestrin 24 Fe TABS; Therapy: (Recorded:24Jun2013) to 6. Lovaza 1 GM Oral Capsule; Therapy: 28Feb2013 to 7. Multivitamin/Mineral Formula TABS; Therapy: (Recorded:30Nov2007) to 8. Vitamin B12 TABS; Therapy: (Recorded:30Nov2007) to 9. Zaleplon 10 MG Oral Capsule; Therapy: 12Jan2013 to 10. Zonisamide 25 MG Oral Capsule; Therapy: 23Oct2012 to  Allergies Medication  1. Flexeril TABS  Family History Problems  1. Paternal history of  Blood Disorders V18.3 2. Maternal history of  Blood In Urine 3. Family history of  Death In The Family Mother 4. Maternal history of  Diabetes Mellitus V18.0 5. Paternal history of  Diabetes Mellitus V18.0 6. Family history of  Family Health Status Children ___ Living Daughters 7. Family history of  Family Health Status Children ___ Living Sons 8. Family history of  Family Health Status Number Of Children 2 SONS, 1 DAUGHTER 9. Paternal history of  Prostate Cancer V16.42 10. Family history of  Tuberculosis 11. Maternal history of  Urologic Disorder V18.7  Social History Problems  1. Caffeine Use 4 CUPS COFFEE PER DAY 2. Marital History - Currently Married 3. Never A Smoker 4. 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 Alcohol Use 6. Tobacco Use V15.82  Vitals Vital Signs [Data Includes: Last 1 Day]  25Jul2013 03:52PM  Blood Pressure: 110 / 71 Temperature: 98.3 F Heart Rate: 77  Results/Data  Urine [Data Includes: Last 1 Day]   25Jul2013  COLOR YELLOW   APPEARANCE CLOUDY   SPECIFIC GRAVITY 1.015   pH 6.5   GLUCOSE NEG mg/dL  BILIRUBIN NEG   KETONE NEG mg/dL  BLOOD NEG   PROTEIN NEG mg/dL  UROBILINOGEN 0.2 mg/dL  NITRITE NEG  LEUKOCYTE ESTERASE NEG   SQUAMOUS EPITHELIAL/HPF RARE   WBC NONE SEEN WBC/hpf  RBC NONE SEEN RBC/hpf  BACTERIA NONE SEEN   CRYSTALS NONE SEEN   CASTS NONE SEEN   Other Amorphous noted    Assessment Assessed  1. Chronic Interstitial Cystitis 595.1 2.  Urinary Frequency 788.41  Plan   Discussion/Summary   Kristina Lewis was given VESIcare 5 mg with samples and prescription. She was given Toviaz 8 mg with samples and prescription. She will see me in 2 months time. She was given literature about Interstim. She is interested in perhaps having Interstim since her frequency especially in the morning is significantly impacting her quality of life. I am glad that her pain has settled down with rescue treatments.  After a thorough review of the management options for the patient's condition the patient  elected to proceed with surgical therapy as noted above. We have discussed the potential benefits and risks of the procedure, side effects of the proposed treatment, the likelihood of the patient achieving the goals of the procedure, and any potential problems that might occur during the procedure or recuperation. Informed consent has been obtained.

## 2012-10-08 ENCOUNTER — Ambulatory Visit (HOSPITAL_COMMUNITY): Payer: BC Managed Care – PPO

## 2012-10-08 ENCOUNTER — Ambulatory Visit (HOSPITAL_BASED_OUTPATIENT_CLINIC_OR_DEPARTMENT_OTHER): Payer: BC Managed Care – PPO | Admitting: Anesthesiology

## 2012-10-08 ENCOUNTER — Encounter (HOSPITAL_BASED_OUTPATIENT_CLINIC_OR_DEPARTMENT_OTHER)
Admission: RE | Disposition: A | Discharge status: Home / Self Care | Payer: Self-pay | Source: Ambulatory Visit | Attending: Urology

## 2012-10-08 ENCOUNTER — Encounter (HOSPITAL_BASED_OUTPATIENT_CLINIC_OR_DEPARTMENT_OTHER): Payer: Self-pay | Admitting: Anesthesiology

## 2012-10-08 ENCOUNTER — Ambulatory Visit (HOSPITAL_BASED_OUTPATIENT_CLINIC_OR_DEPARTMENT_OTHER)
Admission: RE | Admit: 2012-10-08 | Discharge: 2012-10-08 | Disposition: A | Discharge status: Home / Self Care | Payer: BC Managed Care – PPO | Source: Ambulatory Visit | Attending: Urology | Admitting: Urology

## 2012-10-08 DIAGNOSIS — R1031 Right lower quadrant pain: Secondary | ICD-10-CM | POA: Insufficient documentation

## 2012-10-08 DIAGNOSIS — R350 Frequency of micturition: Secondary | ICD-10-CM | POA: Insufficient documentation

## 2012-10-08 DIAGNOSIS — E7800 Pure hypercholesterolemia, unspecified: Secondary | ICD-10-CM | POA: Insufficient documentation

## 2012-10-08 DIAGNOSIS — K219 Gastro-esophageal reflux disease without esophagitis: Secondary | ICD-10-CM | POA: Insufficient documentation

## 2012-10-08 DIAGNOSIS — Z79899 Other long term (current) drug therapy: Secondary | ICD-10-CM | POA: Insufficient documentation

## 2012-10-08 DIAGNOSIS — N3941 Urge incontinence: Secondary | ICD-10-CM | POA: Insufficient documentation

## 2012-10-08 DIAGNOSIS — N3010 Interstitial cystitis (chronic) without hematuria: Secondary | ICD-10-CM | POA: Insufficient documentation

## 2012-10-08 HISTORY — DX: Pain in left leg: M79.605

## 2012-10-08 HISTORY — DX: Other specified postprocedural states: R11.2

## 2012-10-08 HISTORY — DX: Urge incontinence: N39.41

## 2012-10-08 HISTORY — PX: INTERSTIM IMPLANT PLACEMENT: SHX5130

## 2012-10-08 HISTORY — DX: Other specified postprocedural states: Z98.890

## 2012-10-08 SURGERY — INSERTION, SACRAL NERVE STIMULATOR, INTERSTIM, STAGE 1
Anesthesia: Monitor Anesthesia Care | Site: Back | Wound class: Clean

## 2012-10-08 MED ORDER — CEFAZOLIN SODIUM 1-5 GM-% IV SOLN
1.0000 g | INTRAVENOUS | Status: DC
  Filled 2012-10-08: qty 50

## 2012-10-08 MED ORDER — FENTANYL CITRATE 0.05 MG/ML IJ SOLN
INTRAMUSCULAR | Status: DC | PRN
  Administered 2012-10-08 (×2): 50 ug via INTRAVENOUS
  Administered 2012-10-08: 100 ug via INTRAVENOUS

## 2012-10-08 MED ORDER — MIDAZOLAM HCL 5 MG/5ML IJ SOLN
INTRAMUSCULAR | Status: DC | PRN
  Administered 2012-10-08: 2 mg via INTRAVENOUS

## 2012-10-08 MED ORDER — LIDOCAINE HCL (CARDIAC) 20 MG/ML IV SOLN
INTRAVENOUS | Status: DC | PRN
  Administered 2012-10-08: 50 mg via INTRAVENOUS

## 2012-10-08 MED ORDER — OXYCODONE HCL 5 MG/5ML PO SOLN
5.0000 mg | Freq: Once | ORAL | Status: DC | PRN
Start: 2012-10-08 — End: 2012-10-08
  Filled 2012-10-08: qty 5

## 2012-10-08 MED ORDER — DEXAMETHASONE SODIUM PHOSPHATE 4 MG/ML IJ SOLN
INTRAMUSCULAR | Status: DC | PRN
  Administered 2012-10-08: 10 mg via INTRAVENOUS

## 2012-10-08 MED ORDER — HYDROMORPHONE HCL PF 1 MG/ML IJ SOLN
0.2500 mg | INTRAMUSCULAR | Status: DC | PRN
Start: 2012-10-08 — End: 2012-10-08
  Filled 2012-10-08: qty 1

## 2012-10-08 MED ORDER — LACTATED RINGERS IV SOLN
INTRAVENOUS | Status: DC
  Administered 2012-10-08: 12:00:00 via INTRAVENOUS
  Administered 2012-10-08: 100 mL/h via INTRAVENOUS
  Filled 2012-10-08: qty 1000

## 2012-10-08 MED ORDER — CEFAZOLIN SODIUM-DEXTROSE 2-3 GM-% IV SOLR
2.0000 g | INTRAVENOUS | Status: AC
  Administered 2012-10-08: 2 g via INTRAVENOUS
  Filled 2012-10-08: qty 50

## 2012-10-08 MED ORDER — MEPERIDINE HCL 25 MG/ML IJ SOLN
6.2500 mg | INTRAMUSCULAR | Status: DC | PRN
  Filled 2012-10-08: qty 1

## 2012-10-08 MED ORDER — 0.9 % SODIUM CHLORIDE (POUR BTL) OPTIME
TOPICAL | Status: DC | PRN
  Administered 2012-10-08: 1000 mL

## 2012-10-08 MED ORDER — PROMETHAZINE HCL 25 MG/ML IJ SOLN
6.2500 mg | INTRAMUSCULAR | Status: DC | PRN
  Filled 2012-10-08: qty 1

## 2012-10-08 MED ORDER — BUPIVACAINE-EPINEPHRINE 0.5% -1:200000 IJ SOLN
INTRAMUSCULAR | Status: DC | PRN
  Administered 2012-10-08: 25 mL

## 2012-10-08 MED ORDER — HYDROCODONE-ACETAMINOPHEN 5-325 MG PO TABS: 1.0000 | ORAL_TABLET | Freq: Four times a day (QID) | ORAL | Status: DC | PRN

## 2012-10-08 MED ORDER — ACETAMINOPHEN 10 MG/ML IV SOLN
1000.0000 mg | Freq: Once | INTRAVENOUS | Status: DC | PRN
  Filled 2012-10-08: qty 100

## 2012-10-08 MED ORDER — ACETAMINOPHEN 10 MG/ML IV SOLN
INTRAVENOUS | Status: DC | PRN
  Administered 2012-10-08: 1000 mg via INTRAVENOUS

## 2012-10-08 MED ORDER — CEPHALEXIN 250 MG PO CAPS: 250.0000 mg | ORAL_CAPSULE | Freq: Three times a day (TID) | ORAL | Status: DC

## 2012-10-08 MED ORDER — KETOROLAC TROMETHAMINE 30 MG/ML IJ SOLN
INTRAMUSCULAR | Status: DC | PRN
  Administered 2012-10-08: 30 mg via INTRAVENOUS

## 2012-10-08 MED ORDER — OXYCODONE HCL 5 MG PO TABS
5.0000 mg | ORAL_TABLET | Freq: Once | ORAL | Status: DC | PRN
  Filled 2012-10-08: qty 1

## 2012-10-08 MED ORDER — LIDOCAINE-EPINEPHRINE (PF) 1 %-1:200000 IJ SOLN
INTRAMUSCULAR | Status: DC | PRN
  Administered 2012-10-08: 25 mL

## 2012-10-08 MED ORDER — ONDANSETRON HCL 4 MG/2ML IJ SOLN
INTRAMUSCULAR | Status: DC | PRN
  Administered 2012-10-08: 4 mg via INTRAVENOUS

## 2012-10-08 MED ORDER — PROPOFOL 10 MG/ML IV EMUL
INTRAVENOUS | Status: DC | PRN
  Administered 2012-10-08: 50 ug/kg/min via INTRAVENOUS

## 2012-10-08 SURGICAL SUPPLY — 56 items
ADH SKN CLS APL DERMABOND .7 (GAUZE/BANDAGES/DRESSINGS) ×1
ANTNA NRSTM XTRN TELEM NS LF (UROLOGICAL SUPPLIES) ×1
APL SKNCLS STERI-STRIP NONHPOA (GAUZE/BANDAGES/DRESSINGS) ×2
BAG URINE DRAINAGE (UROLOGICAL SUPPLIES) ×2 IMPLANT
BAG URINE LEG 500ML (DRAIN) IMPLANT
BANDAGE ADHESIVE 1X3 (GAUZE/BANDAGES/DRESSINGS) IMPLANT
BENZOIN TINCTURE PRP APPL 2/3 (GAUZE/BANDAGES/DRESSINGS) ×4 IMPLANT
BLADE HEX COATED 2.75 (ELECTRODE) ×2 IMPLANT
BLADE SURG 15 STRL LF DISP TIS (BLADE) ×1 IMPLANT
BLADE SURG 15 STRL SS (BLADE) ×2
CATH FOLEY 2WAY SLVR  5CC 16FR (CATHETERS) ×1
CATH FOLEY 2WAY SLVR 5CC 16FR (CATHETERS) ×1 IMPLANT
CLOTH BEACON ORANGE TIMEOUT ST (SAFETY) ×2 IMPLANT
COVER MAYO STAND STRL (DRAPES) ×2 IMPLANT
COVER PROBE W GEL 5X96 (DRAPES) ×2 IMPLANT
COVER TABLE BACK 60X90 (DRAPES) ×2 IMPLANT
DERMABOND ADVANCED (GAUZE/BANDAGES/DRESSINGS) ×1
DERMABOND ADVANCED .7 DNX12 (GAUZE/BANDAGES/DRESSINGS) ×1 IMPLANT
DRAPE C-ARM 42X72 X-RAY (DRAPES) ×2 IMPLANT
DRAPE INCISE 23X17 IOBAN STRL (DRAPES) ×1
DRAPE INCISE 23X17 STRL (DRAPES) ×1 IMPLANT
DRAPE INCISE IOBAN 23X17 STRL (DRAPES) ×1 IMPLANT
DRAPE LAPAROSCOPIC ABDOMINAL (DRAPES) ×2 IMPLANT
DRAPE LG THREE QUARTER DISP (DRAPES) ×2 IMPLANT
DRESSING TELFA 8X3 (GAUZE/BANDAGES/DRESSINGS) ×1 IMPLANT
DRSG TEGADERM 4X4.75 (GAUZE/BANDAGES/DRESSINGS) ×1 IMPLANT
ELECT REM PT RETURN 9FT ADLT (ELECTROSURGICAL) ×2
ELECTRODE REM PT RTRN 9FT ADLT (ELECTROSURGICAL) ×1 IMPLANT
GAUZE SPONGE 4X4 12PLY STRL LF (GAUZE/BANDAGES/DRESSINGS) IMPLANT
GLOVE BIO SURGEON STRL SZ7.5 (GLOVE) ×2 IMPLANT
GOWN PREVENTION PLUS LG XLONG (DISPOSABLE) ×2 IMPLANT
GOWN STRL REIN XL XLG (GOWN DISPOSABLE) ×2 IMPLANT
HOLDER FOLEY CATH W/STRAP (MISCELLANEOUS) ×2 IMPLANT
INTRODUCER GUIDE DILATR SHEATH (SET/KITS/TRAYS/PACK) ×2 IMPLANT
LEAD (Lead) ×2 IMPLANT
NEEDLE FORAMEN 20GA 3.5  9CM (NEEDLE) IMPLANT
NEEDLE FORAMEN 20GA 5  12.5CM (NEEDLE) IMPLANT
NEEDLE HYPO 22GX1.5 SAFETY (NEEDLE) ×2 IMPLANT
PACK BASIN DAY SURGERY FS (CUSTOM PROCEDURE TRAY) ×2 IMPLANT
PENCIL BUTTON HOLSTER BLD 10FT (ELECTRODE) IMPLANT
PROGRAMMER ANTENNA EXT (UROLOGICAL SUPPLIES) ×2 IMPLANT
PROGRAMMER STIMUL 2.2X1.1X3.7 (UROLOGICAL SUPPLIES) ×2 IMPLANT
STAPLER VISISTAT 35W (STAPLE) IMPLANT
STIMULATOR INTERSTIM 2X1.7X.3 (Orthopedic Implant) ×2 IMPLANT
STRIP CLOSURE SKIN 1/2X4 (GAUZE/BANDAGES/DRESSINGS) ×2 IMPLANT
SUT SILK 2 0 (SUTURE) ×2
SUT SILK 2-0 18XBRD TIE 12 (SUTURE) ×1 IMPLANT
SUT VIC AB 3-0 SH 27 (SUTURE) ×4
SUT VIC AB 3-0 SH 27X BRD (SUTURE) ×2 IMPLANT
SUT VICRYL 4-0 PS2 18IN ABS (SUTURE) ×3 IMPLANT
SYR BULB IRRIGATION 50ML (SYRINGE) ×2 IMPLANT
SYR CONTROL 10ML LL (SYRINGE) ×2 IMPLANT
SYRINGE 10CC LL (SYRINGE) ×2 IMPLANT
TOWEL OR 17X24 6PK STRL BLUE (TOWEL DISPOSABLE) ×4 IMPLANT
TRAY DSU PREP LF (CUSTOM PROCEDURE TRAY) ×2 IMPLANT
WATER STERILE IRR 500ML POUR (IV SOLUTION) ×2 IMPLANT

## 2012-10-08 NOTE — Anesthesia Preprocedure Evaluation (Addendum)
Anesthesia Evaluation  Patient identified by MRN, date of birth, ID band Patient awake    Reviewed: Allergy & Precautions, H&P , NPO status , Patient's Chart, lab work & pertinent test results  History of Anesthesia Complications (+) PONV  Airway Mallampati: I TM Distance: >3 FB Neck ROM: Full    Dental  (+) Dental Advisory Given and Teeth Intact   Pulmonary neg pulmonary ROS,  breath sounds clear to auscultation        Cardiovascular - Peripheral Vascular Disease negative cardio ROS  Rhythm:Regular Rate:Normal     Neuro/Psych  Headaches, Anxiety    GI/Hepatic negative GI ROS, Neg liver ROS,   Endo/Other  negative endocrine ROS  Renal/GU negative Renal ROS     Musculoskeletal negative musculoskeletal ROS (+)   Abdominal   Peds  Hematology negative hematology ROS (+)   Anesthesia Other Findings   Reproductive/Obstetrics negative OB ROS                          Anesthesia Physical Anesthesia Plan  ASA: II  Anesthesia Plan: MAC   Post-op Pain Management:    Induction: Intravenous  Airway Management Planned: Simple Face Mask and Nasal Cannula  Additional Equipment:   Intra-op Plan:   Post-operative Plan:   Informed Consent: I have reviewed the patients History and Physical, chart, labs and discussed the procedure including the risks, benefits and alternatives for the proposed anesthesia with the patient or authorized representative who has indicated his/her understanding and acceptance.   Dental advisory given  Plan Discussed with: CRNA  Anesthesia Plan Comments:         Anesthesia Quick Evaluation

## 2012-10-08 NOTE — Transfer of Care (Signed)
Immediate Anesthesia Transfer of Care Note  Patient: Kristina Lewis  Procedure(s) Performed: Procedure(s) (LRB): INTERSTIM IMPLANT FIRST STAGE (N/A) INTERSTIM IMPLANT SECOND STAGE (N/A)  Patient Location: Patient transported to PACU with oxygen via face mask at 3 Liters / Min  Anesthesia Type: MAC  Level of Consciousness: awake and alert   Airway & Oxygen Therapy: Patient Spontanous Breathing and Patient connected to face mask oxygen  Post-op Assessment: Report given to PACU RN and Post -op Vital signs reviewed and stable  Post vital signs: Reviewed and stable  Complications: No apparent anesthesia complications

## 2012-10-08 NOTE — Anesthesia Postprocedure Evaluation (Signed)
Anesthesia Post Note  Patient: Kristina Lewis  Procedure(s) Performed: Procedure(s) (LRB): INTERSTIM IMPLANT FIRST STAGE (N/A) INTERSTIM IMPLANT SECOND STAGE (N/A)  Anesthesia type: MAC  Patient location: PACU  Post pain: Pain level controlled  Post assessment: Post-op Vital signs reviewed  Last Vitals: BP 104/68  Pulse 79  Temp(Src) 36.1 C (Oral)  Resp 16  Ht 5' 3.5" (1.613 m)  Wt 139 lb 5 oz (63.192 kg)  BMI 24.29 kg/m2  SpO2 100%  LMP 09/22/2012  Post vital signs: Reviewed  Level of consciousness: awake  Complications: No apparent anesthesia complications

## 2012-10-08 NOTE — Op Note (Signed)
Preoperative diagnosis: Refractory urgency incontinence and frequency Postoperative diagnosis: Refractory urgency incontinence and frequency Surgery: Stage I and stage II InterStim implantation and impedance check Surgeon: Dr. Alfredo Martinez  The patient consented the above procedure with the above diagnoses. Preoperative antibiotics were given. Extra care was taken with preparation as usual. She was placed in the prone position.  Using fluoroscopy in the AP position and then lateral I marked the S3 on the left and right. Throughout the entire case multiple sites were utilized and a total of 50 cc of a lidocaine epinephrine mixture was used. This was spread out over approximately 50 minutes.  The S3 foramina was entered on the patient's left side with the 3.5 inch framing needle. She had good bellows but no toe and had vaginal sensation. We really felt we were in S3 so we checked the other side. A lot of angle changes were utilized and I believe her foramen was quite small. Finally the foramen given excellent bellows at very low power and good vaginal sensation. I removed its inner core and passed the guidewire to appropriate depth  I made an appropriate length incision an appropriate depth. White trocar was placed to the appropriate depth. Framing needle was advanced pulling back the wire allowing it to curve laterally to the patient's left. AP position was also checked and I was very pleased with the lateral and AP views  At all 4 positions she had excellent bellows and very good vaginal sensation at power level 1-2. She never get toe response on either side.  Under direct fluoroscopic guidance I removed the white trocar bleeding the lead in situ.  I made a 4-1/2 cm lower right buttock incision after instilling lidocaine as above and made the appropriate depth pocket. I delivered with the passer the lead from midline to lateral and attach it to the Medtronic IPG. It was placed nicely in a snug  pocket with no tension on the skin flaps.  Impedance check was done within the patient's body and all 4 positions the impedance was normal. Sterile technique was utilized  All incisions were irrigated. I closed the midline incision with interrupted 3-0 Vicryl. I closed the lateral incision with subcuticular 3-0 Vicryl followed by 4-0 subcuticular Vicryl. Waterproof dressing was applied. Patient was taken to recover room.  The case took extra time but overall I was very pleased with the motor and Senter responses

## 2012-10-09 ENCOUNTER — Encounter (HOSPITAL_BASED_OUTPATIENT_CLINIC_OR_DEPARTMENT_OTHER): Payer: Self-pay | Admitting: Urology

## 2012-12-16 ENCOUNTER — Other Ambulatory Visit: Payer: Self-pay | Admitting: Gynecology

## 2012-12-16 NOTE — Telephone Encounter (Signed)
Pt called about BCP as well, spoke with her and informed pt that she overdue for annual. Due in Feb 2014, pt will be leaving out of town for about 12 days and will make appointment with front desk for annual, I transferred pt to front desk. I explained to pt I will send refill for her.

## 2013-01-14 ENCOUNTER — Other Ambulatory Visit (HOSPITAL_COMMUNITY): Payer: Self-pay | Admitting: Physician Assistant

## 2013-01-14 DIAGNOSIS — G252 Other specified forms of tremor: Secondary | ICD-10-CM

## 2013-01-14 DIAGNOSIS — G250 Essential tremor: Secondary | ICD-10-CM

## 2013-01-14 DIAGNOSIS — R269 Unspecified abnormalities of gait and mobility: Secondary | ICD-10-CM

## 2013-01-16 ENCOUNTER — Ambulatory Visit (HOSPITAL_COMMUNITY): Admission: RE | Admit: 2013-01-16 | Payer: BC Managed Care – PPO | Source: Ambulatory Visit

## 2013-01-20 ENCOUNTER — Ambulatory Visit (HOSPITAL_COMMUNITY)
Admission: RE | Admit: 2013-01-20 | Discharge: 2013-01-20 | Disposition: A | Discharge status: Home / Self Care | Payer: BC Managed Care – PPO | Source: Ambulatory Visit | Attending: Physician Assistant | Admitting: Physician Assistant

## 2013-01-20 DIAGNOSIS — G250 Essential tremor: Secondary | ICD-10-CM

## 2013-01-20 DIAGNOSIS — R269 Unspecified abnormalities of gait and mobility: Secondary | ICD-10-CM | POA: Insufficient documentation

## 2013-01-20 DIAGNOSIS — R259 Unspecified abnormal involuntary movements: Secondary | ICD-10-CM | POA: Insufficient documentation

## 2013-01-27 ENCOUNTER — Encounter: Payer: Self-pay | Admitting: Neurology

## 2013-01-27 ENCOUNTER — Ambulatory Visit (INDEPENDENT_AMBULATORY_CARE_PROVIDER_SITE_OTHER): Payer: BC Managed Care – PPO | Admitting: Neurology

## 2013-01-27 VITALS — BP 108/70 | HR 70 | Temp 98.0°F | Ht 64.0 in | Wt 144.0 lb

## 2013-01-27 DIAGNOSIS — M6281 Muscle weakness (generalized): Secondary | ICD-10-CM

## 2013-01-27 DIAGNOSIS — R269 Unspecified abnormalities of gait and mobility: Secondary | ICD-10-CM

## 2013-01-27 DIAGNOSIS — R29898 Other symptoms and signs involving the musculoskeletal system: Secondary | ICD-10-CM

## 2013-01-27 NOTE — Progress Notes (Signed)
NEUROLOGY CONSULTATION NOTE  Kristina Lewis MRN: [id number] DOB: 08/14/1965  Referring provider: Mr. Loreta Ave Primary care provider: Mr. Loreta Ave  Reason for consult:  Abnormal gait and left sided clumsiness  HISTORY OF PRESENT ILLNESS: Kristina Lewis is a 47 y.o. right-handed female with interstitial cystitis status post , ADD, and headaches presents for evaluation of left sided weakness and gait abnormalities.  She is accompanied by her husband.  Outside records and image personally reviewed.  Onset of symptoms started about 10 months ago.  She first noticed left sided leg pain, primarily in the left hip.  She then felt that she was dragging her leg a little more.  She also noted clumsiness of her left hand, as well.  She also has "shakiness" of her arm and leg, especially when using the limb.  She has not noticed any tremors at rest.  This usually is worse after chores.  She feels off-balance as well, but has not had any falls.  She does not tingling in the fingers of both hands.  She also notes tingling in the toes of both feet, but thinks it is a little more pronounced in the left.  She denies any stiffness or freezing of limb.  Sense of smell is intact.  She denies symptoms consistent with orthostasis.  She does not have any reported history of vivid dreams or acting out in her sleep.  She denies regular difficulty swallowing.  She sometimes forgets things, but this is not new.  However, she was talking to somebody and forgot the name of her twins' school teacher.  Her husband notes that she seems "slow to complete tasks" but this is due to physical inability or weakness rather than cognitive.  She has some localized neck and has a remote history of "disc bulge" in the neck.  She had an interstim implant in April, as performed by Dr. Alfredo Martinez, for overactive bladder from interstitial cystitis.  She reportedly had a 25 lb weight gain since December.  She has never had similar symptoms before and  denies family history of neuromuscular or neurodegenerative disease.   MRI of brain without gad was performed on 01/21/13 and personally reviewed.  It looks unremarkable.  PAST MEDICAL HISTORY: Past Medical History  Diagnosis Date  . IC (interstitial cystitis)   . Seasonal allergies   . Bulging disc     CERVICAL  . Anxiety   . Urge incontinence   . PONV (postoperative nausea and vomiting)   . Left leg pain     FELL 10-03-2012    PAST SURGICAL HISTORY: Past Surgical History  Procedure Laterality Date  . Lasik      bilateral  . Cesarean section  12/2000  &  01-26-2003    X2  . Breast enhancement surgery  AGE 56  . Tonsillectomy  AGE 68'S  . Cysto/ urethral dilation/ hod  X2  LAST ONE 12-19-2004  . Interstim implant placement N/A 10/08/2012    Procedure: INTERSTIM IMPLANT FIRST STAGE;  Surgeon: Martina Sinner, MD;  Location: [facility];  Service: Urology;  Laterality: N/A;  . Interstim implant placement N/A 10/08/2012    Procedure: INTERSTIM IMPLANT SECOND STAGE;  Surgeon: Martina Sinner, MD;  Location[facility];  Service: Urology;  Laterality: N/A;    MEDICATIONS: Current Outpatient Prescriptions on File Prior to Visit  Medication Sig Dispense Refill  . ALPRAZolam (XANAX) 0.5 MG tablet Take 0.5 mg by mouth at bedtime as needed.      Marland Kitchen  Ca Phosphate-Cholecalciferol (CALTRATE GUMMY BITES PO) Take by mouth.      . fexofenadine (ALLEGRA) 180 MG tablet Take 180 mg by mouth daily.      . fish oil-omega-3 fatty acids 1000 MG capsule Take 4 g by mouth daily.       Marland Kitchen FLUoxetine (PROZAC) 20 MG tablet Take 20 mg by mouth daily.      Marland Kitchen HEATHER 0.35 MG tablet TAKE ONE TABLET BY MOUTH DAILY  28 tablet  1  . ibuprofen (ADVIL,MOTRIN) 200 MG tablet Take 200 mg by mouth every 6 (six) hours as needed for pain.      . Magnesium 500 MG CAPS Take 1 capsule by mouth at bedtime.      Marland Kitchen zonisamide (ZONEGRAN) 50 MG capsule Take 100 mg by mouth daily.       .  cephALEXin (KEFLEX) 250 MG capsule Take 1 capsule (250 mg total) by mouth 3 (three) times daily.  15 capsule  0  . zaleplon (SONATA) 10 MG capsule Take 10 mg by mouth at bedtime as needed.       No current facility-administered medications on file prior to visit.    ALLERGIES: Allergies  Allergen Reactions  . Adhesive (Tape) Itching    VERY IRRITATED  . Monistat (Miconazole) Swelling    FAMILY HISTORY: Family History  Problem Relation Age of Onset  . Diabetes Mother   . Diabetes Father   . Cancer Father     prostate  . Hypertension Father   . Hypertension Maternal Grandmother   . Heart disease Maternal Grandmother   . Diabetes Paternal Grandfather   . Breast cancer Maternal Aunt   . Cancer Cousin     MATERNAL    SOCIAL HISTORY: History   Social History  . Marital Status: Married    Spouse Name: N/A    Number of Children: N/A  . Years of Education: N/A   Occupational History  . Not on file.   Social History Main Topics  . Smoking status: Never Smoker   . Smokeless tobacco: Never Used  . Alcohol Use: No  . Drug Use: No  . Sexually Active: Not on file   Other Topics Concern  . Not on file   Social History Narrative  . No narrative on file    REVIEW OF SYSTEMS: Constitutional: No fevers, chills, or sweats, no generalized fatigue, change in appetite Eyes: No visual changes, double vision, eye pain Ear, nose and throat: No hearing loss, ear pain, nasal congestion, sore throat Cardiovascular: No chest pain, palpitations Respiratory:  No shortness of breath at rest or with exertion, wheezes GastrointestinaI: No nausea, vomiting, diarrhea, abdominal pain, fecal incontinence Genitourinary:  No dysuria, urinary retention or frequency Musculoskeletal:  No neck pain, back pain Integumentary: No rash, pruritus, skin lesions Neurological: as above Psychiatric: No depression, insomnia, anxiety Endocrine: No palpitations, fatigue, diaphoresis, mood swings, change in  appetite, change in weight, increased thirst Hematologic/Lymphatic:  No anemia, purpura, petechiae. Allergic/Immunologic: no itchy/runny eyes, nasal congestion, recent allergic reactions, rashes  PHYSICAL EXAM: Filed Vitals:   01/27/13 1257  BP: 108/70  Pulse: 70  Temp: 98 F (36.7 C)   General: No acute distress Head:  Normocephalic/atraumatic Neck: supple, no paraspinal tenderness, full range of motion Back: No paraspinal tenderness Heart: regular rate and rhythm Lungs: Clear to auscultation bilaterally. Vascular: No carotid bruits. Neurological Exam: Mental status: alert and oriented to person, place, time and self, speech fluent and not dysarthric, language intact. Cranial nerves: CN  I: not tested CN II: pupils equal, round and reactive to light, visual fields intact, fundi unremarkable. CN III, IV, VI:  full range of motion, no nystagmus, no ptosis CN V: facial sensation intact CN VII: upper and lower face symmetric CN VIII: hearing intact CN IX, X: gag intact, uvula midline CN XI: sternocleidomastoid and trapezius muscles intact CN XII: tongue midline Bulk & Tone: normal, no fasciculations. Motor: Reduced finger-thumb tapping speed and amplitude, no rigidity or spasticity, 5-/5 interossei of left hand, and 4+/5 quadriceps of left leg, otherwise 5/5. Sensation: temperature and vibration throughout Deep Tendon Reflexes: 3+ throughout but mildly asymmetrically more brisk on the left, Hoffman's sign negative, toes down on right, toes mute on left Finger to nose testing: mildly slowed on the left, trace postural fine tremor but no resting tremor. Gait: upright posture but a little leaning towards left, reduced left arm-swing, moves left leg slower but no fenestrations/shuffling, turns around in appropriate number of steps, able to walk in tandem with mild sway, . Romberg with sway.  IMPRESSION & PLAN: Kristina Lewis is a 47 y.o. female with left sided clumsiness and mild  weakness, as well as gait instability.  Etiology unknown at this time, but it could be lesion in cervical spinal cord, a neuromuscular process, or atypical movement disorder (such as corticobasal degeneration or atypical Parkinson's). 1.  First, I think we need to rule out a lesion in the cervical spinal cord, especially since she has left sided symptoms with mildly more asymmetric hyperreflexia on the left.  MRI would be ideal, but her interstim may be a contraindication.  I spoke with her urologist, Dr. Sherron Monday, via phone.  He is going to find out if an MRI can be performed and get back to me.  If not, we will have to get a CT with contrast instead. 2.  Follow up within a week of the imaging to discuss next step, particularly if imaging is unrevealing.  NCS-EMG may be next step to rule out a neuromuscular process.  If this is not revealing, consider treating as a movement disorder with dopamine agonist, such as ropinirole, or consider referral to Dr. Arbutus Leas, our movement disorder specialist.  45 minutes spent with patient and her husband, over 50% spent counseling and coordinating care.  Thank you for allowing me to take part in the care of this patient.  Shon Millet, DO  CC:  Lenise Herald, PA-C  Alfredo Martinez, MD

## 2013-01-27 NOTE — Patient Instructions (Signed)
Not sure what it is yet.  I would first like to check the spinal cord in the neck.  I will contact [facility] (Dr. Sherron Monday) to see if MRI is possible.  Otherwise, we will get a CT scan.  I want to see you within a week from the imaging of the neck.  If imaging unremarkable, consider EMG to look for muscle problem, or referral to movement disorder specialist to see if it is an atypical movement disorder.

## 2013-01-28 ENCOUNTER — Encounter: Payer: Self-pay | Admitting: Gynecology

## 2013-01-28 ENCOUNTER — Telehealth: Payer: Self-pay | Admitting: Neurology

## 2013-01-28 NOTE — Telephone Encounter (Signed)
I called Kristina Lewis regarding my discussion with Dr. Sherron Monday regarding getting a cervical MRI with an interstim.  The FDA recommendations state that there should not be any MRIs taken below the head.  Given her symptoms, Dr. Sherron Monday did say that he would be willing to take out the interstim in order to get the MRI.   Otherwise, we would proceed with the CT.  Given her symptoms, I feel that an MRI would be much more helpful in evaluating the cervical spine.  CT can show obvious mass lesions or disc bulging pinching the spinal cord, but it can miss certain cord lesions.  She told me she will discuss it with her husband and get back to me this evening or in the morning.  I answered her questions to the best of my ability. Shon Millet, DO

## 2013-01-28 NOTE — Telephone Encounter (Signed)
Patient left a vm message at 1:39 pm today stating she did not want to have the interstim removed at this time but did want to proceed with the CT scan of the head.  **Dr. Everlena Cooper, do you want to order a CT head with and without contrast? Please advise. Thank you.

## 2013-01-29 ENCOUNTER — Other Ambulatory Visit (HOSPITAL_COMMUNITY)
Admission: RE | Admit: 2013-01-29 | Discharge: 2013-01-29 | Disposition: A | Discharge status: Home / Self Care | Payer: BC Managed Care – PPO | Source: Ambulatory Visit | Attending: Gynecology | Admitting: Gynecology

## 2013-01-29 ENCOUNTER — Ambulatory Visit (INDEPENDENT_AMBULATORY_CARE_PROVIDER_SITE_OTHER): Payer: BC Managed Care – PPO | Admitting: Gynecology

## 2013-01-29 ENCOUNTER — Encounter: Payer: Self-pay | Admitting: Gynecology

## 2013-01-29 ENCOUNTER — Telehealth: Payer: Self-pay | Admitting: *Deleted

## 2013-01-29 ENCOUNTER — Other Ambulatory Visit: Payer: Self-pay | Admitting: Neurology

## 2013-01-29 VITALS — BP 126/78 | Ht 63.75 in | Wt 142.0 lb

## 2013-01-29 DIAGNOSIS — Z01419 Encounter for gynecological examination (general) (routine) without abnormal findings: Secondary | ICD-10-CM

## 2013-01-29 DIAGNOSIS — L293 Anogenital pruritus, unspecified: Secondary | ICD-10-CM

## 2013-01-29 DIAGNOSIS — R531 Weakness: Secondary | ICD-10-CM

## 2013-01-29 DIAGNOSIS — Z1151 Encounter for screening for human papillomavirus (HPV): Secondary | ICD-10-CM | POA: Insufficient documentation

## 2013-01-29 DIAGNOSIS — K649 Unspecified hemorrhoids: Secondary | ICD-10-CM

## 2013-01-29 DIAGNOSIS — L292 Pruritus vulvae: Secondary | ICD-10-CM

## 2013-01-29 LAB — WET PREP FOR TRICH, YEAST, CLUE
Clue Cells Wet Prep HPF POC: NONE SEEN
Trich, Wet Prep: NONE SEEN
WBC, Wet Prep HPF POC: NONE SEEN

## 2013-01-29 MED ORDER — HYDROCORTISONE 1 % EX CREA: TOPICAL_CREAM | Freq: Two times a day (BID) | CUTANEOUS | Status: DC | PRN

## 2013-01-29 MED ORDER — FLUCONAZOLE 100 MG PO TABS: 100.0000 mg | ORAL_TABLET | Freq: Every day | ORAL | Status: DC

## 2013-01-29 MED ORDER — LIDOCAINE-HYDROCORTISONE ACE 2.8-0.55 % RE GEL: 20.0000 g | Freq: Three times a day (TID) | RECTAL | Status: DC | PRN

## 2013-01-29 NOTE — Telephone Encounter (Signed)
Please call in Nupercainal # 1 tube refill x 2 sig apply prn

## 2013-01-29 NOTE — Telephone Encounter (Signed)
rx sent

## 2013-01-29 NOTE — Telephone Encounter (Signed)
Molly called from from pharmacy stating the the Lidocaine-Hydrocortisone cream is no made anymore. Pt will need something else. Please advise

## 2013-01-29 NOTE — Progress Notes (Signed)
Kristina Lewis [birth year] [id number]   History:    47 y.o.  for annual gyn exam who was complaining of vaginal pruritus and questionable hemorrhoids. Patient has been followed by her urologist Dr. 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 Patient with no prior history of abnormal Pap smear. Patient has received a Tdap. Primary physician at [facility] has been doing her lab work patient with past history of breast augmentation.  Past medical history,surgical history, family history and social history were all reviewed and documented in the EPIC chart.  Gynecologic History Patient's last menstrual period was 01/29/2013. Contraception: OCP (estrogen/progesterone) Last Pap: 2011. Results were: normal Last mammogram: 2013. Results were: normal  Obstetric History OB History   Grav Para Term Preterm Abortions TAB SAB Ect Mult Living   3 2 2      1 3      # Outc Date GA Lbr Len/2nd Wgt Sex Del Anes PTL Lv   1A TRM     F CS  No Yes   1B      M CS  No Yes   2 TRM     M CS  No Yes   3 GRA                ROS: A ROS was performed and pertinent positives and negatives are included in the history.  GENERAL: No fevers or chills. HEENT: No change in vision, no earache, sore throat or sinus congestion. NECK: No pain or stiffness. CARDIOVASCULAR: No chest pain or pressure. No palpitations. PULMONARY: No shortness of breath, cough or wheeze. GASTROINTESTINAL: No abdominal pain, nausea, vomiting or diarrhea, melena or bright red blood per rectum. GENITOURINARY: No urinary frequency, urgency, hesitancy or dysuria. MUSCULOSKELETAL: No joint or muscle pain, no back pain, no recent trauma. DERMATOLOGIC: No rash, no itching, no lesions. ENDOCRINE: No polyuria, polydipsia, no heat or cold intolerance. No recent change in weight. HEMATOLOGICAL: No anemia or easy bruising or bleeding.  NEUROLOGIC: No headache, seizures, numbness, tingling or weakness. PSYCHIATRIC: No depression, no loss of interest in normal activity or change in sleep pattern.     Exam: chaperone present  BP 126/78  Ht 5' 3.75" (1.619 m)  Wt 142 lb (64.411 kg)  BMI 24.57 kg/m2  LMP 01/29/2013  Body mass index is 24.57 kg/(m^2).  General appearance : Well developed well nourished female. No acute distress HEENT: Neck supple, trachea midline, no carotid bruits, no thyroidmegaly Lungs: Clear to auscultation, no rhonchi or wheezes, or rib retractions  Heart: Regular rate and rhythm, no murmurs or gallops Breast:Examined in sitting and supine position were symmetrical in appearance, no palpable masses or tenderness,  no skin retraction, no nipple inversion, no nipple discharge, no skin discoloration, no axillary or supraclavicular lymphadenopathy Abdomen: no palpable masses or tenderness, no rebound or guarding Extremities: no edema or skin discoloration or tenderness  Pelvic:  Bartholin, Urethra, Skene Glands: Within normal limits             Vagina: No gross lesions or discharge  Cervix: No gross lesions or discharge  Uterus  retroverted, normal size, shape and consistency, non-tender and mobile  Adnexa  Without masses or tenderness  Anus and perineum  normal   Rectovaginal  normal sphincter tone without palpated masses or tenderness, no internal or external hemorrhoids             Hemoccult not  indicated   Wet prep moderate yeast and moderate bacteria  Assessment/Plan:  47 y.o. female for annual exam  The patient was was prescribed Diflucan for her Monilia masses. Pap smear was done today. The new guidelines were discussed. Patient was reminded of the importance of monthly breast exam. We discussed importance of calcium vitamin D with regular exercise for osteoporosis prevention. For her perianal pruritus and discomfort at times due to hemorrhoids a prescription for 1% hydrocortisone cream was  provided as well. The patient is being evaluated by the neurologist as a result of neurological deficit of the left arm and leg.    Ok Edwards MD, 5:55 PM 01/29/2013

## 2013-01-29 NOTE — Telephone Encounter (Signed)
I would then like to order CT of cervical spine w/contrast.  Reason is left sided weakness.

## 2013-01-29 NOTE — Telephone Encounter (Signed)
Called and spoke with the patient. CT cervical spine with contrast scheduled 02/03/13 at 1200; arrive 11:45 Entrance A. Patient given the phone number as well. No other questions at this point.

## 2013-02-03 ENCOUNTER — Encounter (HOSPITAL_COMMUNITY): Payer: Self-pay

## 2013-02-03 ENCOUNTER — Ambulatory Visit (HOSPITAL_COMMUNITY)
Admission: RE | Admit: 2013-02-03 | Discharge: 2013-02-03 | Disposition: A | Discharge status: Home / Self Care | Payer: BC Managed Care – PPO | Source: Ambulatory Visit | Attending: Neurology | Admitting: Neurology

## 2013-02-03 DIAGNOSIS — R29898 Other symptoms and signs involving the musculoskeletal system: Secondary | ICD-10-CM | POA: Insufficient documentation

## 2013-02-03 DIAGNOSIS — R531 Weakness: Secondary | ICD-10-CM

## 2013-02-03 DIAGNOSIS — M5380 Other specified dorsopathies, site unspecified: Secondary | ICD-10-CM | POA: Insufficient documentation

## 2013-02-03 DIAGNOSIS — R209 Unspecified disturbances of skin sensation: Secondary | ICD-10-CM | POA: Insufficient documentation

## 2013-02-03 MED ORDER — IOHEXOL 300 MG/ML  SOLN: 75.0000 mL | Freq: Once | INTRAMUSCULAR | Status: AC | PRN

## 2013-02-04 ENCOUNTER — Telehealth: Payer: Self-pay | Admitting: Neurology

## 2013-02-04 DIAGNOSIS — R269 Unspecified abnormalities of gait and mobility: Secondary | ICD-10-CM

## 2013-02-04 DIAGNOSIS — R531 Weakness: Secondary | ICD-10-CM

## 2013-02-04 NOTE — Telephone Encounter (Signed)
Called and spoke with the patient. Information given re: CT results. The patient will go forward with the EMG study here in our office. We will call her to schedule.

## 2013-02-04 NOTE — Telephone Encounter (Signed)
Message copied by Benay Spice on Tue Feb 04, 2013  4:09 PM ------      Message from: JAFFE, ADAM R      Created: Tue Feb 04, 2013 12:47 PM       It looks unremarkable.  No abnormality on CT seen that could explain her symptoms.  I think the next step would be to perform an EMG.      AJ      ----- Message -----         From: Rad Results In Interface         Sent: 02/03/2013   3:49 PM           To: Cira Servant, DO                   ------

## 2013-02-04 NOTE — Telephone Encounter (Signed)
Patient calling for the results of her CT scan done 02/03/13. **Dr. Everlena Cooper, please advise.

## 2013-02-07 ENCOUNTER — Other Ambulatory Visit: Payer: Self-pay | Admitting: Gynecology

## 2013-02-07 MED ORDER — LEVONORGESTREL-ETHINYL ESTRAD 0.1-20 MG-MCG PO TABS: 1.0000 | ORAL_TABLET | Freq: Every day | ORAL | Status: DC

## 2013-02-07 NOTE — Telephone Encounter (Signed)
Please tell patient that at the age of 36 she should be on a lower estrogen containing oral contraceptive pill such as one with 20 mcg of estrogen and she is on one  that has . We could call her in Alesse 28 year oral contraceptive pill 30 day supply with 11 refills.

## 2013-02-07 NOTE — Telephone Encounter (Signed)
I spoke with patient. She understands. I will refuse this refill and enter new Rx for Kristina Lewis.

## 2013-02-07 NOTE — Telephone Encounter (Signed)
I did not see mention of this Rx in office note from 01/29/13 CE. Please advise regarding refill.

## 2013-02-26 ENCOUNTER — Ambulatory Visit (INDEPENDENT_AMBULATORY_CARE_PROVIDER_SITE_OTHER): Payer: BC Managed Care – PPO | Admitting: Neurology

## 2013-02-26 ENCOUNTER — Encounter: Payer: Self-pay | Admitting: Neurology

## 2013-02-26 DIAGNOSIS — R531 Weakness: Secondary | ICD-10-CM

## 2013-02-26 DIAGNOSIS — M6281 Muscle weakness (generalized): Secondary | ICD-10-CM

## 2013-02-26 NOTE — Procedures (Signed)
[facility]  [address], [address]  [place], [place] [address] Tel: [phone] Fax:  [phone]  Test Date:  02/26/2013  Patient: Kristina Lewis DOB: Feb 04, 1966 Physician: Shon Millet, DO  Sex: Female Height: 5\' 4"  Ref Phys:   ID#: [id number]   Technician:    Patient Complaints: Kristina Lewis is a 47 y.o. female with left sided clumsiness and mild weakness, as well as gait instability.  The patient's identification is ascertained.  The EMG procedure is explained to the patient in detail who appears to comprehend the discussion and verbal consent is obtained.  NCV & EMG Findings: The motor conductions of the left peroneal nerve, left tibial nerve, left ulnar nerve and left median nerve are normal.  The sensory conductions of the left median nerve, left ulnar nerve and both sural nerves were unobtainable.  There were no abnormalities on the needle electrode examination when testing the left tibialis anterior, gastrocnemius, vastus medialis, iliopsoas, gluteus medius, first dorsal interosseous, pronator teres, biceps, triceps, and deltoid muscles.  As the limb muscle was normal, the paraspinal muscle was not tested.  All examined muscles (as indicated in the following table) showed no evidence of electrical instability.    Impression: This is an essentially normal EMG.  The inability to obtain sensory nerve action potentials are likely technical rather than pathologic, given the inability to obtain a leveled baseline.  Repeat study may be helpful.   ___________________________ Shon Millet, DO    Nerve Conduction Studies Anti Sensory Summary Table   Site NR Peak (ms) Norm Peak (ms) P-T Amp (V) Norm P-T Amp  Left Median Anti Sensory (2nd Digit)    likely artifact  Wrist NR  <3.5  >7  Left Sural Anti Sensory (Lat Mall)    artifact  Calf NR  <4.4  >6  Right Sural Anti Sensory (Lat Mall)    likely artifact  Calf NR  <4.4  >6  Left Ulnar Anti Sensory (5th Digit)     likely artifact  Wrist NR  <3.1  >5   Motor Summary Table   Site NR Onset (ms) Norm Onset (ms) O-P Amp (mV) Norm O-P Amp Site1 Site2 Delta-0 (ms) Dist (cm) Vel (m/s) Norm Vel (m/s)  Left Median Motor (Abd Poll Brev)  Wrist    3.0 <4.4 9.8 >4 Elbow Wrist 4.0 23.0 58 >49  Elbow    7.0  9.3         Left Peroneal Motor (Ext Dig Brev)  Ankle    3.8 <6.1 3.4 >2 B Fib Ankle 5.8 28.0 48 >41  B Fib    9.6  3.0  Poplt B Fib 2.4 10.0 42 >41  Poplt    12.0  3.1         Left Tibial Motor (Abd Hall Brev)  Ankle    4.1 <6.1 11.7 >3.0 Knee Ankle 7.2 35.0 49 >41  Knee    11.3  6.1         Left Ulnar Motor (Abd Dig Minimi)  Wrist    3.7 <3.5 12.0 >6 B Elbow Wrist 2.5 18.4 74 >49  B Elbow    6.2  11.5  A Elbow B Elbow 1.4 10.0 71 >49  A Elbow    7.6  10.9          EMG   Side Muscle Nerve Root Ins Act Fibs Psw Amp Dur Poly Recrt Int Dennie Bible Comment  Left AntTibialis Dp Br Fibular L4-5 Nml Nml Nml  Nml Nml 0 Nml Nml   Left Gastroc Tibial S1-2 Nml Nml Nml Nml Nml 0 Nml Nml   Left VastusMed Femoral L2-4 Nml Nml Nml Nml Nml 0 Nml Nml   Left Gluteus Med   Nml Nml Nml Nml Nml 0 Nml Nml   Left Iliopsoas Femoral L2-3 Nml Nml Nml Nml Nml 0 Nml Nml   Left 1stDorInt Ulnar C8-T1 Nml Nml Nml Nml Nml 0 Nml Nml   Left PronatorTeres Median C6-7 Nml Nml Nml Nml Nml 0 Nml Nml   Left Biceps Musculocut C5-6 Nml Nml Nml Nml Nml 0 Nml Nml   Left Triceps Radial C6-7-8 Nml Nml Nml Nml Nml 0 Nml Nml   Left Deltoid Axillary C5-6 Nml Nml Nml Nml Nml 0 Nml Nml         Waveforms:

## 2013-02-26 NOTE — Progress Notes (Signed)
See EMG report.

## 2013-03-04 ENCOUNTER — Telehealth: Payer: Self-pay | Admitting: Neurology

## 2013-03-05 ENCOUNTER — Ambulatory Visit: Payer: BC Managed Care – PPO | Admitting: Neurology

## 2013-03-05 NOTE — Telephone Encounter (Signed)
Spoke with the pt. She has an appointment tomorrow for an EMG with Dr. Everlena Cooper. Wanted to be sure the machine would be working this time. I told her yes. No additional questions at this time.

## 2013-03-06 ENCOUNTER — Encounter: Payer: Self-pay | Admitting: Neurology

## 2013-03-06 ENCOUNTER — Ambulatory Visit (INDEPENDENT_AMBULATORY_CARE_PROVIDER_SITE_OTHER): Payer: BC Managed Care – PPO | Admitting: Neurology

## 2013-03-06 DIAGNOSIS — M6281 Muscle weakness (generalized): Secondary | ICD-10-CM

## 2013-03-06 DIAGNOSIS — R209 Unspecified disturbances of skin sensation: Secondary | ICD-10-CM

## 2013-03-06 DIAGNOSIS — R531 Weakness: Secondary | ICD-10-CM

## 2013-03-06 DIAGNOSIS — R200 Anesthesia of skin: Secondary | ICD-10-CM

## 2013-03-06 NOTE — Procedures (Signed)
[facility]  [address], [address]  [place], [place] [address] Tel: [phone] Fax:  [phone]  Test Date:  03/06/2013  Patient: Kristina Lewis DOB: Dec 10, 1965 Physician: Shon Millet, DO  Sex: Female Height: ' " Ref Phys:   ID#: [id number]   Technician:    Patient Complaints: Left sided clumsiness, gait difficulty, numbness of hands and feet.  Limited exam revealed 5-/5 left hand interossei and hip flexion, 4+/5 left triceps, and 3+ DTR left patellar.    NCV & EMG Findings: All sensory nerve conduction studies, including the left median nerve, left ulnar nerve and left sural nerve, were within normal limits.    All examined muscles (as indicated in the following table) showed no evidence of electrical instability.    Impression: There were no abnormalities on the EMG, in conjunction with exam from 02/26/13 .  There is no electrodiagnostic evidence for a peripheral neuropathy or left L2-S1 radiculopathy.   ___________________________ Shon Millet, DO    Nerve Conduction Studies Anti Sensory Summary Table   Site NR Peak (ms) Norm Peak (ms) P-T Amp (V) Norm P-T Amp  Left Median Anti Sensory (2nd Digit)  Wrist    3.0 <3.5 24.5 >7  Right Sural Anti Sensory (Lat Mall)  Calf    3.6 <4.4 11.5 >6  Left Ulnar Anti Sensory (5th Digit)  Wrist    2.5 <3.1 33.0 >5   EMG   Side Muscle Nerve Root Ins Act Fibs Psw Amp Dur Poly Recrt Int Dennie Bible Comment  Right AntTibialis Dp Br Fibular L4-5 Nml Nml Nml Nml Nml 0 Nml Nml   Right Gastroc Tibial S1-2 Nml Nml Nml Nml Nml 0 Nml Nml   Right VastusMed Femoral L2-4 Nml Nml Nml Nml Nml 0 Nml Nml   Right Iliopsoas Femoral L2-3 Nml Nml Nml Nml Nml 0 Nml Nml   Left 1stDorInt Ulnar C8-T1 Nml Nml Nml Nml Nml 0 Nml Nml       Waveforms:

## 2013-03-06 NOTE — Progress Notes (Signed)
See EMG report.

## 2013-03-18 ENCOUNTER — Ambulatory Visit (INDEPENDENT_AMBULATORY_CARE_PROVIDER_SITE_OTHER): Payer: BC Managed Care – PPO | Admitting: Neurology

## 2013-03-18 ENCOUNTER — Encounter: Payer: Self-pay | Admitting: Neurology

## 2013-03-18 VITALS — BP 106/64 | HR 84 | Temp 97.8°F | Resp 20 | Wt 146.0 lb

## 2013-03-18 DIAGNOSIS — R209 Unspecified disturbances of skin sensation: Secondary | ICD-10-CM

## 2013-03-18 LAB — C-REACTIVE PROTEIN: CRP: 0.6 mg/dL (ref 0.5–20.0)

## 2013-03-18 LAB — VITAMIN B12: Vitamin B-12: 354 pg/mL (ref 211–911)

## 2013-03-18 NOTE — Progress Notes (Signed)
[facility] Clinic Note - Initial Visit   Date: March 18, 2013   [name] MRN: [id number] DOB: 13-Jan-1966   Dear Dr Everlena Cooper:  Thank you for your kind referral of Kristina Lewis for consultation of left side weakness. Although her history is well known to you, please allow [name] to reiterate it for the purpose of our medical record. The patient was accompanied to the clinic by husband.   History of Present Illness: Kristina Lewis is a 47 y.o. year-old right-handed Caucasian female with history of interstitial cystitis s/p bladder stimulator placement , ADD, depression, and headaches presenting for evaluation of left sided weakness and left hip pain.   In August 2013, she developed left hip pain, described as achy.  She recalls sitting at the [employer] and being unable to get into a comfortable position.  Activity makes it worse. There was no preceding trauma.  Symptoms slowly improved, but following this she started dragging her left leg more.   Especially since the spring of 2014, her husband has noticed that she keeps her left arm flexed when she walks and she drags her left leg.  She has slow movements on the left side and it feels tight.  She has trips and has fallen 4 times.  Symptoms have not improved or worsened, but remained persistent.  She does not recall any abrupt onset of these symptoms, but husband does remember that she was walking fine one day, then the following day her hand was held in a flexed position.    She has a number of other concerns including left hip pain, bilateral burning feet pain, and fatigue.  Hip pain is achy and chronic.  Bilateral feet pain is described as achy, burning, and soreness.  Walking makes it worse. She endorses a depressed mood and tells me that she is tapering her prosac because she does not feel that it is working.  She has gained 30lb unintentionally over the last year.  She does not enjoy hobbies as much as  she used to and seems to always be overwhelmed.  Short-term memory is also a problem.  Her husband says that she forgets things easily and it takes time for her to remember things, such as tasks to complete.  He feels it maybe because she has so much on her mind all the time.  Denies any suicidal or homicidal ideation.    Out-side paper records, electronic medical record, and images have been reviewed where available and summarized as:   MRI brain 01/21/2013: Negative MRI of the brain.  Per my review, R basal ganglia encephalomalacia, suggestive of an old infarct.  Will discuss with radiology. CT cervical spine 02/03/2013:  Mild degenerative changes C3-4 through C6-7. EMG 03/06/2013:  There were no abnormalities on the EMG, in conjunction with exam from 02/26/13 . There is no electrodiagnostic evidence for a peripheral neuropathy or left L2-S1 radiculopathy. EMG 02/26/2013: This is an essentially normal EMG. The inability to obtain sensory nerve action potentials are likely technical rather than pathologic, given the inability to obtain a leveled baseline.  Labs 5/28: TSH - normal    Past Medical History  Diagnosis Date  . IC (interstitial cystitis)     bladder stimulator 10/2012  . Seasonal allergies   . Bulging disc     CERVICAL  . Anxiety   . Urge incontinence   . PONV (postoperative nausea and vomiting)   . Left leg pain     FELL  10-03-2012    Past Surgical History  Procedure Laterality Date  . Lasik      bilateral  . Cesarean section  12/2000  &  01-26-2003    X2  . Breast enhancement surgery  AGE 72  . Tonsillectomy  AGE 61'S  . Cysto/ urethral dilation/ hod  X2  LAST ONE 12-19-2004  . Interstim implant placement N/A 10/08/2012    Procedure: INTERSTIM IMPLANT FIRST STAGE;  Surgeon: Martina Sinner, MD;  [facility];  Service: Urology;  Laterality: N/A;  . Interstim implant placement N/A 10/08/2012    Procedure: INTERSTIM IMPLANT SECOND STAGE;  Surgeon:  Martina Sinner, MD;  Location: [facility];  Service: Urology;  Laterality: N/A;     Medications:  Current Outpatient Prescriptions on File Prior to Visit  Medication Sig Dispense Refill  . ALPRAZolam (XANAX) 0.5 MG tablet Take 0.5 mg by mouth at bedtime as needed.      . Ca Phosphate-Cholecalciferol (CALTRATE GUMMY BITES PO) Take by mouth.      . clobetasol (TEMOVATE) 0.05 % external solution       . fexofenadine (ALLEGRA) 180 MG tablet Take 180 mg by mouth daily.      . fish oil-omega-3 fatty acids 1000 MG capsule Take 4 g by mouth daily.       Marland Kitchen FLUoxetine (PROZAC) 20 MG tablet Take 20 mg by mouth daily.      . Grapefruit Seed 60 % EXTR by Does not apply route.      Marland Kitchen HEATHER 0.35 MG tablet TAKE ONE TABLET BY MOUTH DAILY  28 tablet  1  . hydrocortisone cream 1 % Apply topically 2 (two) times daily as needed.  30 g  2  . ibuprofen (ADVIL,MOTRIN) 200 MG tablet Take 200 mg by mouth every 6 (six) hours as needed for pain.      Marland Kitchen levonorgestrel-ethinyl estradiol (AVIANE,ALESSE,LESSINA) 0.1-20 MG-MCG tablet Take 1 tablet by mouth daily.  1 Package  11  . Magnesium 500 MG CAPS Take 1 capsule by mouth at bedtime.      . Probiotic Product (PROBIOTIC DAILY PO) Take by mouth.      . zonisamide (ZONEGRAN) 50 MG capsule Take 100 mg by mouth daily.       . zaleplon (SONATA) 10 MG capsule Take 10 mg by mouth at bedtime as needed.       No current facility-administered medications on file prior to visit.    Allergies:  Allergies  Allergen Reactions  . Adhesive [Tape] Itching    VERY IRRITATED  . Monistat [Miconazole] Swelling    Family History: Family History  Problem Relation Age of Onset  . Diabetes Mother     Died, 100  . Diabetes Father     Living, 9  . Cancer Father     prostate  . Hypertension Father   . Hypertension Maternal Grandmother   . Heart disease Maternal Grandmother   . Diabetes Paternal Grandfather   . Breast cancer Maternal Aunt   . Cancer  Cousin     MATERNAL    Social History: History   Social History  . Marital Status: Married    Spouse Name: N/A    Number of Children: N/A  . Years of Education: N/A   Occupational History  . Not on file.   Social History Main Topics  . Smoking status: Never Smoker   . Smokeless tobacco: Never Used  . Alcohol Use: No  . Drug  Use: No  . Sexual Activity: Not on file   Other Topics Concern  . 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  She had three healthy children at home.  Husband is Programmer, multimedia.      Review of Systems:  CONSTITUTIONAL: No fevers, chills, night sweats, + weight gain. + fatigued EYES: No visual changes or eye pain ENT: No hearing changes.  No history of nose bleeds.   RESPIRATORY: No cough, wheezing and shortness of breath.   CARDIOVASCULAR: Negative for chest pain, and palpitations.   GI: Negative for abdominal discomfort, blood in stools or black stools.  No recent change in bowel habits.   GU:  No history of incontinence.   MUSCLOSKELETAL: + joint pain no swelling.  No myalgias.   SKIN: Negative for lesions, rash, and itching.   HEMATOLOGY/ONCOLOGY: Negative for prolonged bleeding, bruising easily, and swollen nodes.  No history of cancer.   ENDOCRINE: Negative for cold or heat intolerance, polydipsia or goiter.   PSYCH:  No depression or anxiety symptoms.   NEURO: As Above.   Vital Signs:  BP 106/64  Pulse 84  Temp(Src) 97.8 F (36.6 C)  Resp 20  Wt 146 lb (66.225 kg)  BMI 25.27 kg/m2   Neurological Exam: MENTAL STATUS including orientation to time, place, person, recent and remote memory, attention span and concentration, language, and fund of knowledge is normal.  Speech is not dysarthric.  CRANIAL NERVES: II:  No visual field defects.  Unremarkable fundi.   III-IV-VI: Pupils equal round and reactive to light.  Mildly L eye esotropia.  Normal conjugate, extra-ocular eye movements in all directions of gaze.  No nystagmus.  No  ptosis.   V:  Normal facial sensation.  Jaw jerk is absent   VII:  Normal facial symmetry and movements.  No pathologic facial reflexes.  VIII:  Normal hearing and vestibular function.   IX-X:  Normal palatal movement.   XI:  Normal shoulder shrug and head rotation.   XII:  Normal tongue strength and range of motion, no deviation or fasciculation.  MOTOR:  No atrophy, fasciculations or abnormal movements.  No pronator drift.    Right Upper Extremity:    Left Upper Extremity:    Deltoid  5/5   Deltoid  5/5   Biceps  5/5   Biceps  5/5   Triceps  5/5   Triceps  5/5   Wrist extensors  5/5   Wrist extensors  5/5   Wrist flexors  5/5   Wrist flexors  5/5   Finger extensors  5/5   Finger extensors  5/5   Finger flexors  5/5   Finger flexors  5/5   Dorsal interossei  5-/5   Dorsal interossei  5-/5   Abductor pollicis  5/5   Abductor pollicis  5/5   Tone (Ashworth scale)  0  Tone (Ashworth scale)  1   Right Lower Extremity:    Left Lower Extremity:    Hip flexors  5/5   Hip flexors  5/5   Hip extensors  5/5   Hip extensors  5/5   Knee flexors  5/5   Knee flexors  5/5   Knee extensors  5/5   Knee extensors  5/5   Dorsiflexors  5/5   Dorsiflexors  5/5   Plantarflexors  5/5   Plantarflexors  5/5   Toe extensors  5/5   Toe extensors  5/5   Toe flexors  5/5   Toe  flexors  5/5   Tone (Ashworth scale)  0  Tone (Ashworth scale)  1   MSRs:  Right                                                                 Left brachioradialis 3  brachioradialis 3+  biceps 3  biceps 3+  triceps 3  triceps 3+  patellar 3  patellar 3+  ankle jerk 2  ankle jerk 2+  Hoffman no  Hoffman yes  plantar response mute  plantar response mute  Crossed adductors present with spread to right side.   No clonus.  SENSORY:  Reduced pin prick over left upper arm (50%), otherwise normal and symmetric perception of light touch, vibration, and proprioception.  Romberg's sign absent.    COORDINATION/GAIT: Normal finger-to-  nose-finger and heel-to-shin.  Mild bradykinesia with finger and toe tapping on left side.  Able to rise from a chair without using arms.  Gait narrow based and stable, poor arm swing on the left side. Tandem and stressed gait intact.      IMPRESSION: Mrs. Deakins is a 47 year-old female presenting for evaluation of left sided weakness.  Her neurological exam discloses mild weakness of intrinsic hand muscles, increased tone on the left side (arm and leg), relatively brisker reflexes, and reduced left upper extremity sensation.  I have personally reviewed her MRI brain and there is encephalomalacia over the right basal ganglia, which I am concerned may represent an old stroke.  If this is the case, this would correlate with her clinical findings. She apparently has had previous imaging of the brain (?MRI vs CT brain) which I have asked her to forward to the office so I can compare her images.  If, indeed, her imaging findings are ischemic, I will proceed with stroke evaluation.  She also complains of burning, achy sensation of her feet and I will check routine labs for reversible causes of neuropathy.  Finally, there is an overlay of depressive symptoms (fatigue, anhedonia, weight gain) and I would like her to continue her prosac because she was planning on tapering off it.   PLAN/RECOMMENDATIONS:  1.  Check the following labs: . Ceruloplasmin  . Copper, serum  . Methylmalonic acid, serum  . IFE, Urine (with Tot Prot)  . Protein electrophoresis, serum  . Immunofixation electrophoresis  . Hemoglobin A1c  . Vitamin B12  . C-reactive protein  . Sedimentation rate  . ANA  . Sjogrens syndrome-A extractable nuclear antibody  . Sjogrens syndrome-B extractable nuclear antibody  2.  Review MRI brain with neuroradiology 3.  Stretching exercises recommended for left arm and leg  4.  Return to clinic in 2-weeks  The duration of this appointment visit was 80 minutes of face-to-face time with the  patient.  Greater than 50% of this time was spent in counseling, explanation of diagnosis, planning of further management, and coordination of care.   Thank you for allowing me to participate in patient's care.  If I can answer any additional questions, I would be pleased to do so.    Sincerely,    Shirley Bolle K. Allena Katz, DO

## 2013-03-18 NOTE — Patient Instructions (Addendum)
Blood work today and follow up in two weeks.

## 2013-03-19 ENCOUNTER — Telehealth: Payer: Self-pay

## 2013-03-19 LAB — SJOGRENS SYNDROME-B EXTRACTABLE NUCLEAR ANTIBODY: SSB (La) (ENA) Antibody, IgG: <1 AU/mL (ref <30)

## 2013-03-19 LAB — ANA: Anti Nuclear Antibody(ANA): NEGATIVE

## 2013-03-19 LAB — SJOGRENS SYNDROME-A EXTRACTABLE NUCLEAR ANTIBODY: SSA (Ro) (ENA) Antibody, IgG: 9 AU/mL (ref <30)

## 2013-03-19 NOTE — Telephone Encounter (Signed)
Pt called she is unable to obtain previous scans they were from 1998 and no one has them any longer.

## 2013-03-20 LAB — UIFE/LIGHT CHAINS/TP QN, 24-HR UR
Alpha 1, Urine: DETECTED — AB
Free Kappa Lt Chains,Ur: 0.78 mg/dL (ref 0.14–2.42)
Free Kappa/Lambda Ratio: 4.59 ratio (ref 2.04–10.37)
Total Protein, Urine: 1.5 mg/dL

## 2013-03-20 LAB — PROTEIN ELECTROPHORESIS, SERUM
Beta 2: 4.6 % (ref 3.2–6.5)
Gamma Globulin: 14.1 % (ref 11.1–18.8)

## 2013-03-20 LAB — IMMUNOFIXATION ELECTROPHORESIS: Total Protein, Serum Electrophoresis: 7.6 g/dL (ref 6.0–8.3)

## 2013-03-20 NOTE — Telephone Encounter (Signed)
No problem.  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 There is some dilation of perivascular spaces bilaterally as well.  All questions were answered.    Donika K. Allena Katz, DO

## 2013-03-21 LAB — METHYLMALONIC ACID, SERUM: Methylmalonic Acid, Quant: 0.22 umol/L (ref <0.40)

## 2013-03-25 ENCOUNTER — Telehealth: Payer: Self-pay | Admitting: *Deleted

## 2013-03-25 ENCOUNTER — Other Ambulatory Visit (INDEPENDENT_AMBULATORY_CARE_PROVIDER_SITE_OTHER): Payer: BC Managed Care – PPO

## 2013-03-25 DIAGNOSIS — R209 Unspecified disturbances of skin sensation: Secondary | ICD-10-CM

## 2013-03-25 NOTE — Telephone Encounter (Signed)
Pt called and said her neurologist wants her to stop taking birth control because it may have caused patient to have a mild stroke. She asked for other options for birth control. I told pt she will need OV to discuss with JF, pt will stop pills and wait to make visit with JF.

## 2013-03-31 ENCOUNTER — Encounter: Payer: Self-pay | Admitting: Gynecology

## 2013-03-31 ENCOUNTER — Other Ambulatory Visit: Payer: Self-pay

## 2013-03-31 ENCOUNTER — Ambulatory Visit (INDEPENDENT_AMBULATORY_CARE_PROVIDER_SITE_OTHER): Payer: BC Managed Care – PPO | Admitting: Gynecology

## 2013-03-31 DIAGNOSIS — Z1231 Encounter for screening mammogram for malignant neoplasm of breast: Secondary | ICD-10-CM

## 2013-03-31 DIAGNOSIS — Z30430 Encounter for insertion of intrauterine contraceptive device: Secondary | ICD-10-CM

## 2013-03-31 DIAGNOSIS — Z309 Encounter for contraceptive management, unspecified: Secondary | ICD-10-CM

## 2013-03-31 NOTE — Patient Instructions (Addendum)
Intrauterine Device Information  An intrauterine device (IUD) is inserted into your uterus and prevents pregnancy. There are 2 types of IUDs available:  · Copper IUD. This type of IUD is wrapped in copper wire and is placed inside the uterus. Copper makes the uterus and fallopian tubes produce a fluid that kills sperm. The copper IUD can stay in place for 10 years.  · Hormone IUD. This type of IUD contains the hormone progestin (synthetic progesterone). The hormone thickens the cervical mucus and prevents sperm from entering the uterus, and it also thins the uterine lining to prevent implantation of a fertilized egg. The hormone can weaken or kill the sperm that get into the uterus. The hormone IUD can stay in place for 5 years.  Your caregiver will make sure you are a good candidate for a contraceptive IUD. Discuss with your caregiver the possible side effects.  ADVANTAGES  · It is highly effective, reversible, long-acting, and low maintenance.  · There are no estrogen-related side effects.  · An IUD can be used when breastfeeding.  · It is not associated with weight gain.  · It works immediately after insertion.  · The copper IUD does not interfere with your female hormones.  · The progesterone IUD can make heavy menstrual periods lighter.  · The progesterone IUD can be used for 5 years.  · The copper IUD can be used for 10 years.  DISADVANTAGES  · The progesterone IUD can be associated with irregular bleeding patterns.  · The copper IUD can make your menstrual flow heavier and more painful.  · You may experience cramping and vaginal bleeding after insertion.  Document Released: 05/23/2004 Document Revised: 09/11/2011 Document Reviewed: 10/22/2010  Exit[employer]® Patient Information ©2014 [employer].

## 2013-03-31 NOTE — Progress Notes (Signed)
Patient presented to the office today to discuss alternative contraceptive method. Patient for many years in the past and been on oral contraceptive pill and then was switched over to Mirena IUD which she was on for several years and decided to go back on low-dose oral contraceptive pill which she discontinued 3 weeks ago per recommendation by her neurologist who is currently evaluating her for her left sided weakness. Patient had a previous MRI in which they described encephalomalacia over the right basal ganglia in stating that this could represent an old stroke. A. Recently started a stroke evaluation and instructed her to discontinue the oral contraceptive pill. She is here wanted to discuss alternative forms of contraception.  Based on the above history I recommended that she consider a nonhormonal IUD such as the ParaGard T380A which is good for 10 years and is 99% effective. She'll be starting her menstrual cycle at the end of this week  Literature and information was provided and the risk and benefits pros and cons of the ParaGard T380A IUD was provided. The other option would be to spare contraception or for a husband had vasectomy or at a later time after her neurological evaluation is completed to consider laparoscopic tubal sterilization procedure or hysteroscopic sterilization.

## 2013-04-01 ENCOUNTER — Ambulatory Visit (INDEPENDENT_AMBULATORY_CARE_PROVIDER_SITE_OTHER): Payer: BC Managed Care – PPO | Admitting: Neurology

## 2013-04-01 ENCOUNTER — Encounter: Payer: Self-pay | Admitting: Neurology

## 2013-04-01 ENCOUNTER — Telehealth: Payer: Self-pay | Admitting: *Deleted

## 2013-04-01 VITALS — BP 112/74 | HR 92 | Temp 97.7°F | Resp 20 | Wt 144.0 lb

## 2013-04-01 DIAGNOSIS — R269 Unspecified abnormalities of gait and mobility: Secondary | ICD-10-CM

## 2013-04-01 DIAGNOSIS — M629 Disorder of muscle, unspecified: Secondary | ICD-10-CM

## 2013-04-01 DIAGNOSIS — R258 Other abnormal involuntary movements: Secondary | ICD-10-CM

## 2013-04-01 DIAGNOSIS — M6289 Other specified disorders of muscle: Secondary | ICD-10-CM

## 2013-04-01 DIAGNOSIS — R259 Unspecified abnormal involuntary movements: Secondary | ICD-10-CM

## 2013-04-01 NOTE — Patient Instructions (Addendum)
1.  MRA brain 2.  US carotids 3.  Check zinc level 4.  Occupational therapy and physical therapy for left sided stiffness will call you directly to schedule. 5.  Return to clinic in 17-months  Carotid ultrasound is at [facility] Tuesday, October 7th at 11:00am.  [address] [place].  Third floor [phone] Your MRA is also scheduled for Tuesday, October 7th at 2:00pm.  Please arrive to [facility], first floor admitting by 1:45pm.  [phone].

## 2013-04-01 NOTE — Telephone Encounter (Signed)
Pt informed with the below note. 

## 2013-04-01 NOTE — Telephone Encounter (Signed)
CONDOMS>>>>>

## 2013-04-01 NOTE — Progress Notes (Signed)
[facility] Neurology Division  Follow-up Visit   Date: 04/01/2013    Kristina Lewis MRN: [id number] DOB: 18-Jun-1966   Interim History: Kristina Lewis is a 47 y.o. year-old right-handed Caucasian female with history of interstitial cystitis s/p bladder stimulator placement , ADD, depression, and headaches returning to the clinic for follow-up of left sided stiffness and falls.  The patient was accompanied to the clinic by friend.  She was last seen in the clinic on 03/18/2013.  Since her last visit, she continues to have left sided stiffness.  She reports having left sided pain (including the face, arm, and leg) about three times per week. Pain is shooting in quality and legs can be achy.  She has a history of headaches which started in her 46s and over the past 7-years worsened.  She is currently taking zonisamide 100mg  at bedtime.  She also complains of trouble falling asleep and takes xanax at bedtime.  History of present illness: In August 2013, she developed left hip pain, described as achy. She recalls sitting at the [employer] and being unable to get into a comfortable position. Activity makes it worse. There was no preceding trauma. Symptoms slowly improved, but following this she started dragging her left leg more. Especially since the spring of 2014, her husband has noticed that she keeps her left arm flexed when she walks and she drags her left leg. She has slow movements on the left side and it feels tight. She trips often and has fallen 4 times. Symptoms have not improved or worsened, but remained persistent. She does not recall any abrupt onset of these symptoms, but husband does remember that she was walking fine one day, then the following day her hand was held in a flexed position.   She has a number of other concerns including left hip pain, bilateral burning feet pain, and fatigue. Hip pain is achy and chronic. Bilateral feet pain is described as achy, burning, and  soreness. Walking makes it worse. She endorses a depressed mood and tells me that she is tapering her prosac because she does not feel that it is working. She has gained 30lb unintentionally over the last year. She does not enjoy hobbies as much as she used to and seems to always be overwhelmed. Short-term memory is also a problem. Her husband says that she forgets things easily and it takes time for her to remember things, such as tasks to complete. He feels it maybe because she has so much on her mind all the time. Denies any suicidal or homicidal ideation.    Medications:  Current Outpatient Prescriptions on File Prior to Visit  Medication Sig Dispense Refill  . ALPRAZolam (XANAX) 0.5 MG tablet Take 0.5 mg by mouth at bedtime as needed.      . Ca Phosphate-Cholecalciferol (CALTRATE GUMMY BITES PO) Take by mouth.      . clobetasol (TEMOVATE) 0.05 % external solution       . fexofenadine (ALLEGRA) 180 MG tablet Take 180 mg by mouth daily.      . fish oil-omega-3 fatty acids 1000 MG capsule Take 4 g by mouth daily.       Marland Kitchen FLUoxetine (PROZAC) 20 MG tablet Take 20 mg by mouth daily.      . Grapefruit Seed 60 % EXTR by Does not apply route.      . hydrocortisone cream 1 % Apply topically 2 (two) times daily as needed.  30 g  2  . ibuprofen (  ADVIL,MOTRIN) 200 MG tablet Take 200 mg by mouth every 6 (six) hours as needed for pain.      . Magnesium 500 MG CAPS Take 1 capsule by mouth at bedtime.      Marland Kitchen zonisamide (ZONEGRAN) 50 MG capsule Take 100 mg by mouth daily.       . zaleplon (SONATA) 10 MG capsule Take 10 mg by mouth at bedtime as needed.       No current facility-administered medications on file prior to visit.    Allergies:  Allergies  Allergen Reactions  . Adhesive [Tape] Itching    VERY IRRITATED  . Monistat [Miconazole] Swelling     Review of Systems:  CONSTITUTIONAL: No fevers, chills, night sweats, or weight loss.   EYES: No visual changes or eye pain ENT: No hearing  changes.  No history of nose bleeds.   RESPIRATORY: No cough, wheezing and shortness of breath.   CARDIOVASCULAR: Negative for chest pain, and palpitations.   GI: Negative for abdominal discomfort, blood in stools or black stools.  No recent change in bowel habits.   GU:  No history of incontinence.   MUSCLOSKELETAL: No history of joint pain or swelling.  +myalgias.   SKIN: Negative for lesions, rash, and itching.   HEMATOLOGY/ONCOLOGY: Negative for prolonged bleeding, bruising easily, and swollen nodes.  No history of cancer.   ENDOCRINE: Negative for cold or heat intolerance, polydipsia or goiter.   PSYCH:  + depression or anxiety symptoms.   NEURO: As Above.   Vital Signs:  BP 112/74  Pulse 92  Temp(Src) 97.7 F (36.5 C)  Resp 20  Wt 144 lb (65.318 kg)  BMI 24.92 kg/m2  LMP 03/03/2013  Neurological Exam: MENTAL STATUS including orientation to time, place, person, recent and remote memory, attention span and concentration, language, and fund of knowledge is normal.  Speech is not dysarthric.  CRANIAL NERVES: II:  No visual field defects.  Unremarkable fundi.   III-IV-VI: Pupils equal round and reactive to light. Mild L eye esotropia. Normal conjugate, extra-ocular eye movements in all directions of gaze.  No nystagmus.  No ptosis prior to or post sustained upgaze.   V:  Normal facial sensation.  Jaw jerk is present.   VII:  Normal facial symmetry and movements.  No pathologic facial reflexes.  VIII:  Normal hearing and vestibular function.   IX-X:  Normal palatal movement.   XI:  Normal shoulder shrug and head rotation.   XII:  Normal tongue strength and range of motion, no deviation or fasciculation.  MOTOR:  No atrophy, fasciculations or abnormal movements.  No pronator drift.  Tone is normal.    Right Upper Extremity:    Left Upper Extremity:    Deltoid  5/5   Deltoid  5/5   Biceps  5/5   Biceps  5/5   Triceps  5/5   Triceps  5/5   Wrist extensors  5/5   Wrist extensors   5/5   Wrist flexors  5/5   Wrist flexors  5/5   Finger extensors  5/5   Finger extensors  5/5   Finger flexors  5/5   Finger flexors  5/5   Dorsal interossei  5-/5   Dorsal interossei  5-/5   Abductor pollicis  5/5   Abductor pollicis  5/5   Tone (Ashworth scale)  0  Tone (Ashworth scale)  1   Right Lower Extremity:    Left Lower Extremity:    Hip flexors  5/5  Hip flexors  5/5   Hip extensors  5/5   Hip extensors  5/5   Knee flexors  5/5   Knee flexors  5/5   Knee extensors  5/5   Knee extensors  5/5   Dorsiflexors  5/5   Dorsiflexors  5/5   Plantarflexors  5/5   Plantarflexors  5/5   Toe extensors  5/5   Toe extensors  5/5   Toe flexors  5/5   Toe flexors  5/5   Tone (Ashworth scale)  0  Tone (Ashworth scale)  1   MSRs:  Right Left  brachioradialis  3   brachioradialis  3+   biceps  3   biceps  3+   triceps  3   triceps  3+   patellar  3   patellar  3+   ankle jerk  2   ankle jerk  2+   Hoffman  no   Hoffman  yes   plantar response  mute   plantar response  mute    Crossed adductors present with spread to right side. No clonus.   SENSORY: Reduced pin prick over left upper arm (50%), otherwise normal and symmetric perception of light touch, vibration, and proprioception. Romberg's sign absent.   COORDINATION/GAIT: Normal finger-to- nose-finger and heel-to-shin. Mild bradykinesia with finger and toe tapping on left side. Able to rise from a chair without using arms. Gait narrow based and stable, poor arm swing on the left side. Tandem and stressed gait intact.    Data: MRI brain 01/21/2013: Negative MRI of the brain. Per my review, R basal ganglia encephalomalacia, suggestive of an old infarct. Will discuss with radiology.  CT cervical spine 02/03/2013: Mild degenerative changes C3-4 through C6-7.  EMG 03/06/2013: There were no abnormalities on the EMG, in conjunction with exam from 02/26/13 . There is no electrodiagnostic evidence for a peripheral neuropathy or left L2-S1  radiculopathy.  EMG 02/26/2013: This is an essentially normal EMG. The inability to obtain sensory nerve action potentials are likely technical rather than pathologic, given the inability to obtain a leveled baseline.  Labs 5/28: TSH - normal  Component     Latest Ref Rng 03/18/2013 03/25/2013  Ceruloplasmin     20 - 60 mg/dL 52   Copper     70 - 119 mcg/dL 147 (H)   Methylmalonic Acid, Quant     <0.40 umol/L 0.22   Hemoglobin A1C     4.6 - 6.5 % 5.7   Vitamin B-12     211 - 911 pg/mL 354   CRP     0.5 - 20.0 mg/dL 0.6   ANA     NEGATIVE NEG   SSA (Ro) (ENA) Antibody, IgG     <30 AU/mL 9   SSB (La) (ENA) Antibody, IgG     <30 AU/mL <1   Sed Rate     0 - 22 mm/hr  12  SPEP and UPEP with IFE - No monoclonal protein identified   IMPRESSION: Mrs. Tagliaferri is a 47 year-old female presenting for evaluation of left sided stiffness and pain. Her neurological exam continues to show mild weakness of intrinsic hand muscles, increased tone on the left side (arm and leg), relatively brisker reflexes, and reduced left upper extremity sensation. At her last visit, I was concerned about encephalomalacia over the right basal ganglia on her MRI. 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 There is  some dilation of perivascular spaces bilaterally as well.  Although clinical suspicion for stroke is low, I would like to further evaluate these changes by obtaining MRA of the brain and US carotids.  Interestingly her copper levels are elevated, with normal ceruloplasmin, so I will order zinc level.  She denies taking any copper supplementation.  At this time, I am still uncertain as to the underlying etiology of her increased tone on the left side.  May consider CSF testing going forward.  For symptomatic management, I would like her to be evaluated by occupational therapy and physical therapy for stretching  exercises.   PLAN/RECOMMENDATIONS:  1.  MRA brain 2.  US carotids 3.  Check zinc level 4.  Occupational therapy and physical therapy for left sided stiffness 5.  Return to clinic in 50-months  The duration of this appointment visit was 30 minutes of face-to-face time with the patient.  Greater than 50% of this time was spent in counseling, explanation of diagnosis, planning of further management, and coordination of care.   Thank you for allowing me to participate in patient's care.  If I can answer any additional questions, I would be pleased to do so.    Sincerely,    Tyrian Peart K. Allena Katz, DO

## 2013-04-01 NOTE — Telephone Encounter (Signed)
The safest would be firm husband have a vasectomy and meanwhile use barrier contraception.

## 2013-04-01 NOTE — Telephone Encounter (Signed)
Pt was scheduled to have u/s and ParaGard IUD placement on 04/07/13, pt had her neurology appointment today and found out her copper level was elevated. Results in epic, pt MD told her she does not want to have the ParaGard due to this. Husband will have vasectomy. Pt asked if any other options?

## 2013-04-01 NOTE — Telephone Encounter (Signed)
Sorry error below, pt said husband will not have vasectomy.

## 2013-04-02 ENCOUNTER — Ambulatory Visit: Payer: BC Managed Care – PPO | Admitting: Neurology

## 2013-04-03 ENCOUNTER — Ambulatory Visit: Payer: BC Managed Care – PPO | Attending: Neurology | Admitting: Rehabilitative and Restorative Service Providers"

## 2013-04-03 ENCOUNTER — Other Ambulatory Visit: Payer: Self-pay

## 2013-04-03 DIAGNOSIS — R279 Unspecified lack of coordination: Secondary | ICD-10-CM | POA: Insufficient documentation

## 2013-04-03 DIAGNOSIS — R5381 Other malaise: Secondary | ICD-10-CM | POA: Insufficient documentation

## 2013-04-03 DIAGNOSIS — R269 Unspecified abnormalities of gait and mobility: Secondary | ICD-10-CM | POA: Insufficient documentation

## 2013-04-03 DIAGNOSIS — R259 Unspecified abnormal involuntary movements: Secondary | ICD-10-CM

## 2013-04-03 DIAGNOSIS — M629 Disorder of muscle, unspecified: Secondary | ICD-10-CM

## 2013-04-03 DIAGNOSIS — Z5189 Encounter for other specified aftercare: Secondary | ICD-10-CM | POA: Insufficient documentation

## 2013-04-03 LAB — ZINC: Zinc: 66 ug/dL (ref 60–130)

## 2013-04-07 ENCOUNTER — Other Ambulatory Visit: Payer: BC Managed Care – PPO

## 2013-04-07 ENCOUNTER — Ambulatory Visit: Payer: BC Managed Care – PPO | Admitting: Gynecology

## 2013-04-08 ENCOUNTER — Ambulatory Visit (HOSPITAL_COMMUNITY)
Admission: RE | Admit: 2013-04-08 | Discharge: 2013-04-08 | Disposition: A | Discharge status: Home / Self Care | Payer: BC Managed Care – PPO | Source: Ambulatory Visit | Attending: Neurology | Admitting: Neurology

## 2013-04-08 ENCOUNTER — Ambulatory Visit (HOSPITAL_BASED_OUTPATIENT_CLINIC_OR_DEPARTMENT_OTHER): Payer: BC Managed Care – PPO

## 2013-04-08 ENCOUNTER — Encounter: Payer: Self-pay | Admitting: Neurology

## 2013-04-08 DIAGNOSIS — J392 Other diseases of pharynx: Secondary | ICD-10-CM | POA: Insufficient documentation

## 2013-04-08 DIAGNOSIS — M6289 Other specified disorders of muscle: Secondary | ICD-10-CM

## 2013-04-08 DIAGNOSIS — R269 Unspecified abnormalities of gait and mobility: Secondary | ICD-10-CM

## 2013-04-08 DIAGNOSIS — R258 Other abnormal involuntary movements: Secondary | ICD-10-CM

## 2013-04-09 ENCOUNTER — Ambulatory Visit: Payer: BC Managed Care – PPO | Admitting: Rehabilitative and Restorative Service Providers"

## 2013-04-09 ENCOUNTER — Ambulatory Visit: Payer: BC Managed Care – PPO | Admitting: Occupational Therapy

## 2013-04-10 ENCOUNTER — Telehealth: Payer: Self-pay | Admitting: Neurology

## 2013-04-10 NOTE — Telephone Encounter (Signed)
Call patient with results of MRA brain ultrasound carotids, which was essentially normal.  MRA 04/08/2013 did not visualize right PICA and left AICA, however this is likely flow-related artifact. Otherwise, no medium and large size vessel significant stenosis or occlusion.  Ultrasound of the carotids was normal.  All questions were answered.  Donika K. Allena Katz, DO

## 2013-04-11 ENCOUNTER — Ambulatory Visit: Payer: BC Managed Care – PPO | Admitting: Physical Therapy

## 2013-04-14 ENCOUNTER — Telehealth: Payer: Self-pay | Admitting: Neurology

## 2013-04-16 ENCOUNTER — Ambulatory Visit: Payer: BC Managed Care – PPO | Admitting: Physical Therapy

## 2013-04-16 ENCOUNTER — Ambulatory Visit: Payer: BC Managed Care – PPO | Admitting: Occupational Therapy

## 2013-04-17 ENCOUNTER — Telehealth: Payer: Self-pay

## 2013-04-17 NOTE — Telephone Encounter (Signed)
Pt called and wants to know Dr.Patel's next steps in figuring out what is wrong with her. She wants to do any testing and start treatment before the end of the year because of insurance reasons.

## 2013-04-17 NOTE — Telephone Encounter (Signed)
Called patient but there was no answer on her cell phone so tried her home, but she was not available. Message left with husband that I called and will try again tomorrow.

## 2013-04-18 ENCOUNTER — Ambulatory Visit: Payer: BC Managed Care – PPO | Admitting: Rehabilitative and Restorative Service Providers"

## 2013-04-18 ENCOUNTER — Ambulatory Visit
Admission: RE | Admit: 2013-04-18 | Discharge: 2013-04-18 | Disposition: A | Discharge status: Home / Self Care | Payer: BC Managed Care – PPO | Source: Ambulatory Visit

## 2013-04-18 ENCOUNTER — Telehealth: Payer: Self-pay | Admitting: Neurology

## 2013-04-18 ENCOUNTER — Ambulatory Visit: Payer: BC Managed Care – PPO | Admitting: Occupational Therapy

## 2013-04-18 DIAGNOSIS — Z1231 Encounter for screening mammogram for malignant neoplasm of breast: Secondary | ICD-10-CM

## 2013-04-18 NOTE — Telephone Encounter (Signed)
Called patient back and discussed that even though yield would be low, lumbar puncture can be done to look for signs of inflammation. She is concerned about multiple sclerosis and says that one of her cousins was diagnosed by a changes seen in the her eye and is questioning whether there is any utility to her having her tested.  She has not had her eyes examined in a long time and I suggested that it would be reasonable to have a dilated eye exam.  She will contact us if she decides to pursue CSF testing.  Donika K. Allena Katz, DO

## 2013-04-18 NOTE — Telephone Encounter (Signed)
Pt returning call to Dr. Allena Katz, requests call back between 2-4 or after 430pm / Sherri

## 2013-04-18 NOTE — Telephone Encounter (Signed)
I tried to call patient again today, and left a message on the voicemail.   Neenah Canter K. Allena Katz, DO

## 2013-04-22 ENCOUNTER — Telehealth: Payer: Self-pay | Admitting: Neurology

## 2013-04-22 NOTE — Telephone Encounter (Signed)
Is considering spinal tap w/ Dr. Allena Katz but has questions before she commits to appt. She can come in for follow up if needed to discuss this further / Sherri

## 2013-04-22 NOTE — Telephone Encounter (Signed)
Please arrange follow-up appointment for patient so we can discuss further.  Thanks.

## 2013-04-23 ENCOUNTER — Ambulatory Visit: Payer: BC Managed Care – PPO | Admitting: Occupational Therapy

## 2013-04-23 ENCOUNTER — Ambulatory Visit: Payer: BC Managed Care – PPO | Admitting: Rehabilitative and Restorative Service Providers"

## 2013-04-23 NOTE — Telephone Encounter (Signed)
appt scheduled as requested / Sherri

## 2013-04-25 ENCOUNTER — Ambulatory Visit: Payer: BC Managed Care – PPO | Admitting: Physical Therapy

## 2013-04-25 ENCOUNTER — Ambulatory Visit: Payer: BC Managed Care – PPO | Admitting: Occupational Therapy

## 2013-04-28 ENCOUNTER — Telehealth: Payer: Self-pay

## 2013-04-28 ENCOUNTER — Ambulatory Visit (INDEPENDENT_AMBULATORY_CARE_PROVIDER_SITE_OTHER): Payer: BC Managed Care – PPO | Admitting: Neurology

## 2013-04-28 ENCOUNTER — Encounter: Payer: Self-pay | Admitting: Neurology

## 2013-04-28 VITALS — BP 106/74 | HR 60 | Temp 98.0°F | Resp 12 | Wt 144.9 lb

## 2013-04-28 DIAGNOSIS — R6889 Other general symptoms and signs: Secondary | ICD-10-CM

## 2013-04-28 DIAGNOSIS — R259 Unspecified abnormal involuntary movements: Secondary | ICD-10-CM

## 2013-04-28 DIAGNOSIS — R258 Other abnormal involuntary movements: Secondary | ICD-10-CM

## 2013-04-28 DIAGNOSIS — R29898 Other symptoms and signs involving the musculoskeletal system: Secondary | ICD-10-CM

## 2013-04-28 NOTE — Telephone Encounter (Signed)
Called pt to inform of appointment at [facility] on April 20th at [facility]. Pt aware af appt now.

## 2013-04-28 NOTE — Progress Notes (Signed)
[facility] Neurology Division  Follow-up Visit   Date: 04/28/2013    Kristina Lewis MRN: [id number] DOB: 1965-08-30   Interim History: Kristina Lewis is a 47 y.o. year-old right-handed Caucasian female with history of interstitial cystitis s/p bladder stimulator placement , ADD, depression, and headaches returning to the clinic for follow-up of left sided stiffness and falls.  The patient was accompanied to the clinic by self.  She was last seen in the clinic on 04/01/2013.    She feels as if her left hand shaking is worse.  Left sided stiffness has not improved, despite doing two weeks of occupational therapy.  She has noticed mild improvement of her balance with physical therapy.  There has been no change in the quality of pain, which remains on the left side with shooting/achy pain.  She has stinging over her toes bilaterally (L >R).   History of present illness: In August 2013, she developed left hip pain, described as achy. She recalls sitting at the [employer] and being unable to get into a comfortable position. Activity makes it worse. There was no preceding trauma. Symptoms slowly improved, but following this she started dragging her left leg more. Especially since the spring of 2014, her husband has noticed that she keeps her left arm flexed when she walks and she drags her left leg. She has slow movements on the left side and it feels tight. She trips often and has fallen 4 times. Symptoms have not improved or worsened, but remained persistent. She does not recall any abrupt onset of these symptoms, but husband does remember that she was walking fine one day, then the following day her hand was held in a flexed position.   She has a number of other concerns including left hip pain, bilateral burning feet pain, and fatigue. Hip pain is achy and chronic. Bilateral feet pain is described as achy, burning, and soreness. Walking makes it worse. She endorses a depressed mood  and tells me that she is tapering her prosac because she does not feel that it is working. She has gained 30lb unintentionally over the last year. She does not enjoy hobbies as much as she used to and seems to always be overwhelmed. Short-term memory is also a problem. She has a history of headaches which started in her 49s and over the past 7-years worsened.  She is currently taking zonisamide 100mg  at bedtime.      Medications:  Current Outpatient Prescriptions on File Prior to Visit  Medication Sig Dispense Refill  . ALPRAZolam (XANAX) 0.5 MG tablet Take 0.5 mg by mouth at bedtime as needed.      . Ca Phosphate-Cholecalciferol (CALTRATE GUMMY BITES PO) Take by mouth.      . clobetasol (TEMOVATE) 0.05 % external solution       . fexofenadine (ALLEGRA) 180 MG tablet Take 180 mg by mouth daily.      . fish oil-omega-3 fatty acids 1000 MG capsule Take 4 g by mouth daily.       Marland Kitchen FLUoxetine (PROZAC) 20 MG tablet Take 20 mg by mouth daily.      . Grapefruit Seed 60 % EXTR by Does not apply route.      . hydrocortisone cream 1 % Apply topically 2 (two) times daily as needed.  30 g  2  . ibuprofen (ADVIL,MOTRIN) 200 MG tablet Take 200 mg by mouth every 6 (six) hours as needed for pain.      . Magnesium 500  MG CAPS Take 1 capsule by mouth at bedtime.      . zaleplon (SONATA) 10 MG capsule Take 10 mg by mouth at bedtime as needed.      . zonisamide (ZONEGRAN) 50 MG capsule Take 100 mg by mouth daily.        No current facility-administered medications on file prior to visit.    Allergies:  Allergies  Allergen Reactions  . Adhesive [Tape] Itching    VERY IRRITATED  . Monistat [Miconazole] Swelling     Review of Systems:  CONSTITUTIONAL: No fevers, chills, night sweats, or weight loss.   EYES: No visual changes or eye pain ENT: No hearing changes.  No history of nose bleeds.   RESPIRATORY: No cough, wheezing and shortness of breath.   CARDIOVASCULAR: Negative for chest pain, and  palpitations.   GI: Negative for abdominal discomfort, blood in stools or black stools.  No recent change in bowel habits.   GU:  No history of incontinence.   MUSCLOSKELETAL: No history of joint pain or swelling.  +myalgias.   SKIN: Negative for lesions, rash, and itching.   HEMATOLOGY/ONCOLOGY: Negative for prolonged bleeding, bruising easily, and swollen nodes.  No history of cancer.   ENDOCRINE: Negative for cold or heat intolerance, polydipsia or goiter.   PSYCH:  + depression or anxiety symptoms.   NEURO: As Above.   Vital Signs:  LMP 03/03/2013  Neurological Exam: MENTAL STATUS including orientation to time, place, person, recent and remote memory, attention span and concentration, language, and fund of knowledge is normal.  Speech is not dysarthric.  CRANIAL NERVES: II:  No visual field defects.  Unremarkable fundi.   III-IV-VI: Pupils equal round and reactive to light. Mild bilateral esotropia. Normal conjugate, extra-ocular eye movements in all directions of gaze.  No nystagmus.  No ptosis prior to or post sustained upgaze.   V:  Normal facial sensation.  Jaw jerk is present.   VII:  Normal facial symmetry and movements.  No pathologic facial reflexes.  VIII:  Normal hearing and vestibular function.   IX-X:  Normal palatal movement.   XI:  Normal shoulder shrug and head rotation.   XII:  Normal tongue strength and range of motion, no deviation or fasciculation.  MOTOR:  No atrophy, fasciculations or abnormal movements.  No pronator drift.  Tone is normal.    Right Upper Extremity:    Left Upper Extremity:    Deltoid  5/5   Deltoid  5/5   Biceps  5/5   Biceps  5/5   Triceps  5/5   Triceps  5/5   Wrist extensors  5/5   Wrist extensors  5/5   Wrist flexors  5/5   Wrist flexors  5/5   Finger extensors  5/5   Finger extensors  5/5   Finger flexors  5/5   Finger flexors  5/5   Dorsal interossei  5-/5   Dorsal interossei  5-/5   Abductor pollicis  5/5   Abductor pollicis  5/5    Tone (Ashworth scale)  0  Tone (Ashworth scale)  0+   Right Lower Extremity:    Left Lower Extremity:    Hip flexors  5/5   Hip flexors  5/5   Hip extensors  5/5   Hip extensors  5/5   Knee flexors  5/5   Knee flexors  5/5   Knee extensors  5/5   Knee extensors  5/5   Dorsiflexors  5/5   Dorsiflexors  5/5   Plantarflexors  5/5   Plantarflexors  5/5   Toe extensors  5/5   Toe extensors  5/5   Toe flexors  5/5   Toe flexors  5/5   Tone (Ashworth scale)  0  Tone (Ashworth scale)  1   MSRs:  Right Left  brachioradialis  3   brachioradialis  3+   biceps  3   biceps  3+   triceps  3   triceps  3+   patellar  3   patellar  3+   ankle jerk  2   ankle jerk  2+   Hoffman  no   Hoffman  yes   plantar response  mute   plantar response  mute   Crossed adductors present with spread to right side. No clonus.   SENSORY: Reduced pin prick over left upper arm (50%), otherwise normal and symmetric perception of light touch, vibration, and proprioception. Romberg's sign absent.   COORDINATION/GAIT: Normal finger-to- nose-finger and heel-to-shin. Mild bradykinesia with finger and toe tapping on left side. Able to rise from a chair without using arms. Gait narrow based and stable, poor arm swing on the left side. Tandem and stressed gait intact.    Data: MRI brain 01/21/2013: Negative MRI of the brain.   CT cervical spine 02/03/2013: Mild degenerative changes C3-4 through C6-7.   EMG 03/06/2013: There were no abnormalities on the EMG, in conjunction with exam from 02/26/13 . There is no electrodiagnostic evidence for a peripheral neuropathy or left L2-S1 radiculopathy.   EMG 02/26/2013: This is an essentially normal EMG. The inability to obtain sensory nerve action potentials are likely technical rather than pathologic, given the inability to obtain a leveled baseline.   MRA brain 04/08/2013: Right PICA and left AICA not visualized. Otherwise, no medium and large size vessel significant stenosis or  occlusion. US carotids 04/10/2013:  Normal carotid arteries bilaterally  Labs 5/28: TSH - normal  Component     Latest Ref Rng 03/18/2013 03/25/2013  Ceruloplasmin     20 - 60 mg/dL 52   Copper     70 - 161 mcg/dL 096 (H)   Methylmalonic Acid, Quant     <0.40 umol/L 0.22   Hemoglobin A1C     4.6 - 6.5 % 5.7   Vitamin B-12     211 - 911 pg/mL 354   CRP     0.5 - 20.0 mg/dL 0.6   ANA     NEGATIVE NEG   SSA (Ro) (ENA) Antibody, IgG     <30 AU/mL 9   SSB (La) (ENA) Antibody, IgG     <30 AU/mL <1   Sed Rate     0 - 22 mm/hr  12  SPEP and UPEP with IFE - No monoclonal protein identified   IMPRESSION: Kristina Lewis is a 47 year-old female presenting for evaluation of left sided stiffness and pain. Her neurological exam continues to show mild weakness of intrinsic hand muscles, increased tone on the left side (arm and leg), relatively brisker reflexes, and reduced left upper extremity sensation.  Initially when I saw her in September, I was concerned about encephalomalacia over the right basal ganglia on her MRI. 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 There is some dilation of perivascular spaces bilaterally as well.  Although clinical suspicion for stroke is low, I ordered vessel imaging which was normal. She has had extensive evaluation  including EMG of the left side and CT cervical spine which has been unrevealing. She is unable to get a MRI below the neck because she has a bladder neurostimulator.  I am not sure if she is symptomatic from the right choroidal fissure cyst, as we have no previous imaging to see if there has been a change in its appearance.  Her only notable findings on laboratory testing has been elevated copper. Zinc and ceruloplasmin is normal and she does not take any copper supplementation. I will check a 24-hour copper and heavy-metal screen.  He only a testing which has not been performed is  CSF analysis to look for inflammatory changes. Even though my suspicion is low, I think it is prudent to complete work up. Going forward, I will refer her to the academic center for their opinion.   PLAN/RECOMMENDATIONS:  1.  24-hr urine collection for copper, heavy metal screen 2.  Lumbar puncture - check routine and inflammatory studies 3.  Continue Occupational therapy and physical therapy for left sided stiffness 4.  Referral to Neurology at [facility] 5.  Return to clinic in 56-months  The duration of this appointment visit was 30 minutes of face-to-face time with the patient.  Greater than 50% of this time was spent in counseling, explanation of diagnosis, planning of further management, and coordination of care.   Thank you for allowing me to participate in patient's care.  If I can answer any additional questions, I would be pleased to do so.    Sincerely,    Donika K. Allena Katz, DO

## 2013-04-28 NOTE — Patient Instructions (Addendum)
1.  24-hr urine collection for copper 2.  Heavy metal screen today 3.  Lumbar puncture under radiology is scheduled for October 30th at 7:30am at [place] [facility]. Nothing by mouth after 12am the night before and you will need someone to drive you to and from appt. Call [phone] for additional questions. 4.  [facility] referral.  We will contact you with appointment. 5.  Continue OT and PT 6.  Return to clinic in 6 weeks

## 2013-04-30 ENCOUNTER — Ambulatory Visit: Payer: BC Managed Care – PPO | Admitting: Physical Therapy

## 2013-04-30 ENCOUNTER — Ambulatory Visit: Payer: BC Managed Care – PPO | Admitting: Occupational Therapy

## 2013-05-01 ENCOUNTER — Other Ambulatory Visit (HOSPITAL_COMMUNITY): Payer: BC Managed Care – PPO

## 2013-05-02 ENCOUNTER — Ambulatory Visit: Payer: BC Managed Care – PPO | Admitting: Rehabilitative and Restorative Service Providers"

## 2013-05-02 ENCOUNTER — Ambulatory Visit: Payer: BC Managed Care – PPO | Admitting: Occupational Therapy

## 2013-05-03 LAB — HEAVY METALS, RANDOM URINE: Arsenic Random, Urine: 8 mcg/g creat (ref <51)

## 2013-05-05 ENCOUNTER — Telehealth: Payer: Self-pay

## 2013-05-05 NOTE — Telephone Encounter (Signed)
Pt called and wants to know if Dr.Patel can prescribe a muscle relaxer that was previously discussed at last appt. to help with tight muscles and sleep.

## 2013-05-06 MED ORDER — BACLOFEN 10 MG PO TABS: ORAL_TABLET | ORAL | Status: DC

## 2013-05-06 NOTE — Telephone Encounter (Signed)
Returned call to patient, will start baclofen 10mg  at bedtime.  Kristina K. Allena Katz, DO

## 2013-05-07 ENCOUNTER — Ambulatory Visit: Payer: BC Managed Care – PPO | Admitting: Occupational Therapy

## 2013-05-09 ENCOUNTER — Encounter: Payer: BC Managed Care – PPO | Admitting: Occupational Therapy

## 2013-05-12 ENCOUNTER — Encounter (INDEPENDENT_AMBULATORY_CARE_PROVIDER_SITE_OTHER): Payer: BC Managed Care – PPO | Admitting: Ophthalmology

## 2013-05-12 DIAGNOSIS — H43819 Vitreous degeneration, unspecified eye: Secondary | ICD-10-CM

## 2013-05-12 DIAGNOSIS — H2510 Age-related nuclear cataract, unspecified eye: Secondary | ICD-10-CM

## 2013-05-12 DIAGNOSIS — G35 Multiple sclerosis: Secondary | ICD-10-CM

## 2013-05-15 NOTE — Telephone Encounter (Signed)
Phone note completed ° °

## 2013-05-21 ENCOUNTER — Other Ambulatory Visit: Payer: BC Managed Care – PPO

## 2013-05-22 ENCOUNTER — Telehealth: Payer: Self-pay | Admitting: Neurology

## 2013-05-22 NOTE — Telephone Encounter (Signed)
Clinic note from Dr. Alan Mulder from [facility] rec'd dated 05/12/2013.  No ocular signs of demyelinating disease; optic nerve is healthy. No visual field deficits.  Kristina Parma K. Allena Katz, DO

## 2013-05-26 ENCOUNTER — Encounter (HOSPITAL_COMMUNITY): Payer: Self-pay | Admitting: Pharmacy Technician

## 2013-05-27 ENCOUNTER — Telehealth: Payer: Self-pay | Admitting: Neurology

## 2013-05-27 MED ORDER — LIDOCAINE 5 % EX PTCH: 1.0000 | MEDICATED_PATCH | CUTANEOUS | Status: DC

## 2013-05-27 NOTE — Telephone Encounter (Signed)
Patient requesting referral to orthopaedics for right hip pain and ankle pain.   Donika K. Allena Katz, DO

## 2013-05-29 ENCOUNTER — Other Ambulatory Visit: Payer: Self-pay | Admitting: Radiology

## 2013-05-30 ENCOUNTER — Other Ambulatory Visit (HOSPITAL_COMMUNITY): Payer: Self-pay | Admitting: Radiology

## 2013-06-02 ENCOUNTER — Ambulatory Visit (HOSPITAL_COMMUNITY)
Admission: RE | Admit: 2013-06-02 | Discharge: 2013-06-02 | Disposition: A | Discharge status: Home / Self Care | Payer: BC Managed Care – PPO | Source: Ambulatory Visit | Attending: Neurology | Admitting: Neurology

## 2013-06-02 ENCOUNTER — Ambulatory Visit: Payer: BC Managed Care – PPO | Admitting: Neurology

## 2013-06-02 DIAGNOSIS — M5380 Other specified dorsopathies, site unspecified: Secondary | ICD-10-CM | POA: Insufficient documentation

## 2013-06-02 DIAGNOSIS — R29898 Other symptoms and signs involving the musculoskeletal system: Secondary | ICD-10-CM

## 2013-06-02 DIAGNOSIS — R258 Other abnormal involuntary movements: Secondary | ICD-10-CM

## 2013-06-02 DIAGNOSIS — R292 Abnormal reflex: Secondary | ICD-10-CM | POA: Insufficient documentation

## 2013-06-02 LAB — CSF CELL COUNT WITH DIFFERENTIAL
RBC Count, CSF: 1 /mm3 — ABNORMAL HIGH
Tube #: 3

## 2013-06-02 LAB — GRAM STAIN

## 2013-06-02 MED ORDER — SODIUM BICARBONATE 4.2 % IV SOLN
25.0000 meq | Freq: Once | INTRAVENOUS | Status: DC
Start: 2013-06-02 — End: 2013-06-03
  Filled 2013-06-02: qty 50

## 2013-06-02 MED ORDER — ACETAMINOPHEN 500 MG PO TABS
1000.0000 mg | ORAL_TABLET | Freq: Four times a day (QID) | ORAL | Status: DC | PRN
  Administered 2013-06-02: 1000 mg via ORAL
  Filled 2013-06-02: qty 2

## 2013-06-04 ENCOUNTER — Other Ambulatory Visit: Payer: Self-pay | Admitting: *Deleted

## 2013-06-04 DIAGNOSIS — M25559 Pain in unspecified hip: Secondary | ICD-10-CM

## 2013-06-04 DIAGNOSIS — M25579 Pain in unspecified ankle and joints of unspecified foot: Secondary | ICD-10-CM

## 2013-06-04 NOTE — Telephone Encounter (Signed)
Order placed

## 2013-06-05 ENCOUNTER — Telehealth: Payer: Self-pay | Admitting: *Deleted

## 2013-06-05 ENCOUNTER — Telehealth: Payer: Self-pay | Admitting: Neurology

## 2013-06-05 MED ORDER — BUTALBITAL-APAP-CAFFEINE 50-325-40 MG PO TABS: 1.0000 | ORAL_TABLET | Freq: Four times a day (QID) | ORAL | Status: DC | PRN

## 2013-06-05 NOTE — Telephone Encounter (Signed)
She reports having a headache which started yesterday and she has been taking ibuprofen which helps for a few hours.  Headaches are no worse, but they are not getting better.  Pain is slightly better when she is laying down.  Encouraged her stay well hydrated.  In the meantime, will try Fioricet as needed for headache.   Patient will pick Rx up tomorrow.  Jesalyn Finazzo K. Allena Katz, DO

## 2013-06-05 NOTE — Telephone Encounter (Signed)
Opened in error

## 2013-06-05 NOTE — Telephone Encounter (Signed)
Patient states that she has had a headache for two days now after having a lumbar puncture. Is this something she should be concerned about.

## 2013-06-06 LAB — CSF CULTURE W GRAM STAIN: Gram Stain: NONE SEEN

## 2013-06-06 LAB — OLIGOCLONAL BANDS, CSF + SERM

## 2013-06-06 LAB — CSF CULTURE

## 2013-06-06 NOTE — Telephone Encounter (Signed)
Patient picked up Rx

## 2013-06-10 ENCOUNTER — Ambulatory Visit (INDEPENDENT_AMBULATORY_CARE_PROVIDER_SITE_OTHER): Payer: BC Managed Care – PPO | Admitting: Neurology

## 2013-06-10 ENCOUNTER — Encounter: Payer: Self-pay | Admitting: Neurology

## 2013-06-10 VITALS — BP 100/60 | HR 60 | Temp 98.1°F | Ht 64.0 in | Wt 144.8 lb

## 2013-06-10 DIAGNOSIS — R259 Unspecified abnormal involuntary movements: Secondary | ICD-10-CM

## 2013-06-10 DIAGNOSIS — M6289 Other specified disorders of muscle: Secondary | ICD-10-CM

## 2013-06-10 DIAGNOSIS — M2420 Disorder of ligament, unspecified site: Secondary | ICD-10-CM

## 2013-06-10 DIAGNOSIS — R6889 Other general symptoms and signs: Secondary | ICD-10-CM

## 2013-06-10 DIAGNOSIS — R29898 Other symptoms and signs involving the musculoskeletal system: Secondary | ICD-10-CM

## 2013-06-10 DIAGNOSIS — M629 Disorder of muscle, unspecified: Secondary | ICD-10-CM

## 2013-06-10 DIAGNOSIS — R258 Other abnormal involuntary movements: Secondary | ICD-10-CM

## 2013-06-10 NOTE — Patient Instructions (Addendum)
1.  Start taking baclofen 5mg  in the morning and 10mg  at bedtime.  If tolerating, start taking baclofen 10mg  twice daily 2.  Continue home physical therapy and occupational exercises 3.  Return to clinic in 72-months

## 2013-06-10 NOTE — Progress Notes (Signed)
[facility] Neurology Division  Follow-up Visit   Date: 06/10/2013    Kristina Lewis MRN: [id number] DOB: [birth year]   Interim History: Kristina Lewis is a 47 y.o. year-old right-handed Caucasian female with history of interstitial cystitis s/p bladder stimulator placement , ADD, depression, and headaches returning to the clinic for follow-up of left sided stiffness and falls.  The patient was accompanied to the clinic by her husband.  She was last seen in the clinic on 04/28/2013.    There has been no change in her symptoms since her last visit.  She continues to have (1) stiffness of the left side, (2) left hip pain, (3) burning bilateral feet pain, and (4) chronic fatigue.  She also has shooting pain sporadically throughout her body (back, arms, legs, feet).  She has good days and bad days.  If she has a busy day, symptoms are generally worse for 1-2 days.  Rest improves symptoms.  She underwent CSF testing which was unrevealing (CSF 06/02/2013:  R1 W1 G55  P38  No oligoclonal bands, IgG index pending).    Her husband adds to her history stating that patient's mother had a very unsteady gait and muscle stiffness.  Mother also had high arched feet like the patient. No other family history of abnormal gait, neuropathy, or frequent falls.  She also bought a list of labs performed at a wellness center and states that she was told she has yeast in her blood and was told to be on a specific diet.  Further, she was told she has MTHFR gene mutation and was concerned about its associated with Parkinson's disease.  She is planning on Dr. Roda Shutters in orthopaedics for her chronic hip pain.      History of present illness: In August 2013, she developed left hip pain, described as achy. She recalls sitting at the [employer] and being unable to get into a comfortable position. Activity makes it worse. There was no preceding trauma. Symptoms slowly improved, but following this she started  dragging her left leg more. Especially since the spring of 2014, her husband has noticed that she keeps her left arm flexed when she walks and she drags her left leg. She has slow movements on the left side and it feels tight. She trips often and has fallen 4 times. Symptoms have not improved or worsened, but remained persistent. She does not recall any abrupt onset of these symptoms, but husband does remember that she was walking fine one day, then the following day her hand was held in a flexed position.   She has a number of other concerns including left hip pain, bilateral burning feet pain, and fatigue. Hip pain is achy and chronic. Bilateral feet pain is described as achy, burning, and soreness. Walking makes it worse. She endorses a depressed mood and tells me that she is tapering her prosac because she does not feel that it is working. She has gained 30lb unintentionally over the last year. She does not enjoy hobbies as much as she used to and seems to always be overwhelmed. Short-term memory is also a problem. She has a history of headaches which started in her 3s and over the past 7-years worsened.  She is currently taking zonisamide 100mg  at bedtime.    04/28/2013:  Completed OT which helped with left hand strength and dexterity  Medications:  Current Outpatient Prescriptions on File Prior to Visit  Medication Sig Dispense Refill  . ALPRAZolam (XANAX) 0.5 MG tablet Take  0.5 mg by mouth at bedtime as needed.      . baclofen (LIORESAL) 10 MG tablet Take one-half tablet at bedtime x 3 days, then take one tablet at bedtime thereafter  30 each  3  . butalbital-acetaminophen-caffeine (FIORICET, ESGIC) 50-325-40 MG per tablet Take 1 tablet by mouth every 6 (six) hours as needed for headache.  10 tablet  1  . Ca Phosphate-Cholecalciferol (CALTRATE GUMMY BITES PO) Take 1 tablet by mouth daily.       . clobetasol (TEMOVATE) 0.05 % external solution Apply 1 application topically 2 (two) times daily as  needed (for scalp).       . fish oil-omega-3 fatty acids 1000 MG capsule Take 4 g by mouth daily.       Marland Kitchen FLUoxetine (PROZAC) 10 MG capsule Take 10 mg by mouth daily.      . hydrocortisone cream 1 % Apply 1 application topically 2 (two) times daily as needed for itching.      Marland Kitchen ibuprofen (ADVIL,MOTRIN) 200 MG tablet Take 200 mg by mouth every 6 (six) hours as needed for pain.      Marland Kitchen lidocaine (LIDODERM) 5 % Place 1 patch onto the skin daily. Remove & Discard patch within 12 hours or as directed by MD  1 patch  0  . loratadine (CLARITIN) 10 MG tablet Take 10 mg by mouth daily.      . Magnesium 500 MG CAPS Take 1 capsule by mouth at bedtime.      Marland Kitchen zonisamide (ZONEGRAN) 100 MG capsule Take 100 mg by mouth daily.       No current facility-administered medications on file prior to visit.    Allergies:  Allergies  Allergen Reactions  . Adhesive [Tape] Itching    VERY IRRITATED  . Monistat [Miconazole] Swelling     Review of Systems:  CONSTITUTIONAL: No fevers, chills, night sweats, or weight loss.   EYES: No visual changes or eye pain ENT: No hearing changes.  No history of nose bleeds.   RESPIRATORY: No cough, wheezing and shortness of breath.   CARDIOVASCULAR: Negative for chest pain, and palpitations.   GI: Negative for abdominal discomfort, blood in stools or black stools.  No recent change in bowel habits.   GU:  No history of incontinence.   MUSCLOSKELETAL: No history of joint pain or swelling.  +myalgias.   SKIN: Negative for lesions, rash, and itching.   HEMATOLOGY/ONCOLOGY: Negative for prolonged bleeding, bruising easily, and swollen nodes.  No history of cancer.   ENDOCRINE: Negative for cold or heat intolerance, polydipsia or goiter.   PSYCH:  + depression or anxiety symptoms.   NEURO: As Above.   Vital Signs:  BP 100/60  Pulse 60  Temp(Src) 98.1 F (36.7 C) (Oral)  Ht 5\' 4"  (1.626 m)  Wt 144 lb 12.8 oz (65.681 kg)  BMI 24.84 kg/m2  LMP 05/26/2013  Neurological  Exam: MENTAL STATUS including orientation to time, place, person, recent and remote memory, attention span and concentration, language, and fund of knowledge is normal.  Speech is not dysarthric.  CRANIAL NERVES: II:  No visual field defects.  Unremarkable fundi.   III-IV-VI: Pupils equal round and reactive to light. Mild bilateral esotropia. Normal conjugate, extra-ocular eye movements in all directions of gaze.  No nystagmus.  No ptosis prior to or post sustained upgaze.   V:  Normal facial sensation.  Jaw jerk is present.   VII:  Normal facial symmetry and movements.  No pathologic facial reflexes.  VIII:  Normal hearing and vestibular function.   IX-X:  Normal palatal movement.   XI:  Normal shoulder shrug and head rotation.   XII:  Normal tongue strength and range of motion, no deviation or fasciculation.  MOTOR:  No atrophy, fasciculations or abnormal movements.  No pronator drift.  Tone is normal.  Pes cavus bilaterally.  Right Upper Extremity:    Left Upper Extremity:    Deltoid  5/5   Deltoid  5/5   Biceps  5/5   Biceps  5/5   Triceps  5/5   Triceps  5/5   Wrist extensors  5/5   Wrist extensors  5/5   Wrist flexors  5/5   Wrist flexors  5/5   Finger extensors  5/5   Finger extensors  5/5   Finger flexors  5/5   Finger flexors  5/5   Dorsal interossei  5-/5   Dorsal interossei  5-/5   Abductor pollicis  5/5   Abductor pollicis  5/5   Tone (Ashworth scale)  0  Tone (Ashworth scale)  0+   Right Lower Extremity:    Left Lower Extremity:    Hip flexors  5/5   Hip flexors  5/5   Hip extensors  5/5   Hip extensors  5/5   Knee flexors  5/5   Knee flexors  5/5   Knee extensors  5/5   Knee extensors  5/5   Dorsiflexors  5/5   Dorsiflexors  5/5   Plantarflexors  5/5   Plantarflexors  5/5   Toe extensors  5/5   Toe extensors  5/5   Toe flexors  5/5   Toe flexors  5/5   Tone (Ashworth scale)  0  Tone (Ashworth scale)  1   MSRs:  Right Left  brachioradialis  3   brachioradialis  3+    biceps  3   biceps  3+   triceps  3   triceps  3+   patellar  3   patellar  3+   ankle jerk  2   ankle jerk  2+   Hoffman  no   Hoffman  yes   plantar response  mute   plantar response  mute   Crossed adductors present with spread to right side. No clonus.   SENSORY: Normal and symmetric perception of light touch and vibration.     COORDINATION/GAIT: Normal finger-to- nose-finger and heel-to-shin. Mild bradykinesia with finger and moderate slowness with toe tapping on left side. Able to rise from a chair without using arms. Gait narrow based and stable, poor arm swing on the left side. Tandem and stressed gait intact.    Data: MRI brain 01/21/2013: Negative MRI of the brain.   CT cervical spine 02/03/2013: Mild degenerative changes C3-4 through C6-7.   EMG 03/06/2013: There were no abnormalities on the EMG, in conjunction with exam from 02/26/13 . There is no electrodiagnostic evidence for a peripheral neuropathy or left L2-S1 radiculopathy.   EMG 02/26/2013: This is an essentially normal EMG. The inability to obtain sensory nerve action potentials are likely technical rather than pathologic, given the inability to obtain a leveled baseline.   MRA brain 04/08/2013: Right PICA and left AICA not visualized. Otherwise, no medium and large size vessel significant stenosis or occlusion. US carotids 04/10/2013:  Normal carotid arteries bilaterally  Labs 5/28: TSH - normal  Component     Latest Ref Rng 03/18/2013 03/25/2013  Ceruloplasmin     20 - 60  mg/dL 52   Copper     70 - 324 mcg/dL 401 (H)   Methylmalonic Acid, Quant     <0.40 umol/L 0.22   Hemoglobin A1C     4.6 - 6.5 % 5.7   Vitamin B-12     211 - 911 pg/mL 354   CRP     0.5 - 20.0 mg/dL 0.6   ANA     NEGATIVE NEG   SSA (Ro) (ENA) Antibody, IgG     <30 AU/mL 9   SSB (La) (ENA) Antibody, IgG     <30 AU/mL <1   Sed Rate     0 - 22 mm/hr  12  SPEP and UPEP with IFE - No monoclonal protein identified 24-hr copper -  normal  CSF 06/02/2013:  R1 W1 G55  P38   No oligoclonal bands  IgG index- pending  IMPRESSION: Kristina Lewis is a 47 year-old female presenting for evaluation of left sided stiffness and pain. Her neurological exam continues to show mild weakness of intrinsic hand muscles, increased tone on the left side (arm and leg), relatively brisker reflexes, and reduced left upper extremity sensation.  Initially when I saw her in September, I was concerned about encephalomalacia over the right basal ganglia on her MRI. 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 There is some dilation of perivascular spaces bilaterally as well.  Although clinical suspicion for stroke is low, I ordered vessel imaging which was normal. She has had extensive evaluation including EMG of the left side and CT cervical spine which has been unrevealing. She is unable to get a MRI below the neck because she has a bladder neurostimulator.  I am not sure if she is symptomatic from the right choroidal fissure cyst, as we have no previous imaging to see if there has been a change in its appearance.  Her only notable findings on laboratory testing has been elevated copper, however zinc, ceruloplasmin, and 24-hr copper is normal.  CSF analysis is also unremarkable (IgG index is pending). Regarding her MTHFR gene mutation, I do not feel that it is neurologically relevant.  She had underwent an exhaustive neurological evaluation which has been inconclusive thus far.  With the additional history of gait abnormalities in patient's mother and patient with pes cavus, hereditary spastic parapresis is possible, however she has no weakness. Primary lateral sclerosis is also possible.  Unfortunately, I do not have a definitive diagnosis to explain her symptoms at this time.  I would like to observe her clinically and see how things evolve.  She is scheduled for another neurological  opinion at [facility] in April 2015.   PLAN/RECOMMENDATIONS:  1.  Start taking baclofen 5mg  in the morning and 10mg  at bedtime.  If tolerating, start taking baclofen 10mg  twice daily 2.  Continue home physical therapy and occupational exercises 3.  Referral to Neurology at [facility] scheduled for April 4.  Return to clinic in 74-months  The duration of this appointment visit was 45 minutes of face-to-face time with the patient.  Greater than 50% of this time was spent in counseling, explanation of diagnosis, planning of further management, and coordination of care.   Thank you for allowing me to participate in patient's care.  If I can answer any additional questions, I would be pleased to do so.    Sincerely,    Donika K. Allena Katz, DO

## 2013-06-13 LAB — CNS IGG SYNTHESIS RATE, CSF+BLOOD
IgG Index, CSF: 0.48
IgG, CSF: 2.6
IgG, Serum: 993

## 2013-06-13 NOTE — Progress Notes (Signed)
Addendum  IgG index 0.48  No CSF changes to suggest inflammatory process.  Donika K. Allena Katz, DO 06/13/2013

## 2013-06-17 ENCOUNTER — Ambulatory Visit: Payer: BC Managed Care – PPO | Admitting: Neurology

## 2013-06-24 ENCOUNTER — Other Ambulatory Visit: Payer: Self-pay

## 2013-06-24 MED ORDER — BACLOFEN 10 MG PO TABS: ORAL_TABLET | ORAL | Status: DC

## 2013-09-01 ENCOUNTER — Ambulatory Visit: Payer: BC Managed Care – PPO | Attending: Neurology | Admitting: Physical Therapy

## 2013-09-01 DIAGNOSIS — R269 Unspecified abnormalities of gait and mobility: Secondary | ICD-10-CM | POA: Diagnosis not present

## 2013-09-01 DIAGNOSIS — Z5189 Encounter for other specified aftercare: Secondary | ICD-10-CM | POA: Diagnosis present

## 2013-09-05 ENCOUNTER — Ambulatory Visit: Payer: BC Managed Care – PPO | Admitting: Physical Therapy

## 2013-09-05 DIAGNOSIS — Z5189 Encounter for other specified aftercare: Secondary | ICD-10-CM | POA: Diagnosis not present

## 2013-09-08 ENCOUNTER — Ambulatory Visit: Payer: BC Managed Care – PPO | Admitting: *Deleted

## 2013-09-08 ENCOUNTER — Ambulatory Visit: Payer: BC Managed Care – PPO | Admitting: Physical Therapy

## 2013-09-08 DIAGNOSIS — Z5189 Encounter for other specified aftercare: Secondary | ICD-10-CM | POA: Diagnosis not present

## 2013-09-09 ENCOUNTER — Ambulatory Visit: Payer: BC Managed Care – PPO | Admitting: Neurology

## 2013-09-10 ENCOUNTER — Ambulatory Visit: Payer: BC Managed Care – PPO | Admitting: Physical Therapy

## 2013-09-12 ENCOUNTER — Ambulatory Visit: Payer: BC Managed Care – PPO | Admitting: Occupational Therapy

## 2013-09-12 DIAGNOSIS — Z5189 Encounter for other specified aftercare: Secondary | ICD-10-CM | POA: Diagnosis not present

## 2013-09-17 ENCOUNTER — Ambulatory Visit: Payer: BC Managed Care – PPO | Admitting: Physical Therapy

## 2013-09-17 ENCOUNTER — Encounter: Payer: BC Managed Care – PPO | Admitting: Occupational Therapy

## 2013-09-19 ENCOUNTER — Ambulatory Visit: Payer: BC Managed Care – PPO | Admitting: Physical Therapy

## 2013-09-19 ENCOUNTER — Ambulatory Visit: Payer: BC Managed Care – PPO | Admitting: Occupational Therapy

## 2013-09-19 DIAGNOSIS — Z5189 Encounter for other specified aftercare: Secondary | ICD-10-CM | POA: Diagnosis not present

## 2013-09-22 ENCOUNTER — Encounter: Payer: BC Managed Care – PPO | Admitting: *Deleted

## 2013-09-22 ENCOUNTER — Ambulatory Visit: Payer: BC Managed Care – PPO | Admitting: Physical Therapy

## 2013-09-22 DIAGNOSIS — Z5189 Encounter for other specified aftercare: Secondary | ICD-10-CM | POA: Diagnosis not present

## 2013-09-29 ENCOUNTER — Ambulatory Visit: Payer: BC Managed Care – PPO | Admitting: Physical Therapy

## 2013-09-29 ENCOUNTER — Encounter: Payer: BC Managed Care – PPO | Admitting: Occupational Therapy

## 2013-09-30 ENCOUNTER — Ambulatory Visit: Payer: BC Managed Care – PPO | Admitting: Occupational Therapy

## 2013-09-30 ENCOUNTER — Ambulatory Visit: Payer: BC Managed Care – PPO | Admitting: Physical Therapy

## 2013-09-30 DIAGNOSIS — Z5189 Encounter for other specified aftercare: Secondary | ICD-10-CM | POA: Diagnosis not present

## 2013-10-01 ENCOUNTER — Encounter: Payer: BC Managed Care – PPO | Admitting: Occupational Therapy

## 2013-10-01 ENCOUNTER — Ambulatory Visit: Payer: BC Managed Care – PPO | Admitting: Physical Therapy

## 2013-10-02 ENCOUNTER — Ambulatory Visit: Payer: BC Managed Care – PPO | Admitting: Physical Therapy

## 2013-10-07 ENCOUNTER — Ambulatory Visit: Payer: BC Managed Care – PPO | Admitting: Occupational Therapy

## 2013-10-07 ENCOUNTER — Ambulatory Visit: Payer: BC Managed Care – PPO | Attending: Neurology | Admitting: Physical Therapy

## 2013-10-07 DIAGNOSIS — R269 Unspecified abnormalities of gait and mobility: Secondary | ICD-10-CM | POA: Diagnosis not present

## 2013-10-07 DIAGNOSIS — Z5189 Encounter for other specified aftercare: Secondary | ICD-10-CM | POA: Diagnosis present

## 2013-10-09 ENCOUNTER — Ambulatory Visit: Payer: BC Managed Care – PPO

## 2013-10-09 ENCOUNTER — Ambulatory Visit: Payer: BC Managed Care – PPO | Admitting: Physical Therapy

## 2013-10-09 DIAGNOSIS — Z5189 Encounter for other specified aftercare: Secondary | ICD-10-CM | POA: Diagnosis not present

## 2013-10-14 ENCOUNTER — Ambulatory Visit: Payer: BC Managed Care – PPO | Admitting: Physical Therapy

## 2013-10-14 ENCOUNTER — Ambulatory Visit: Payer: BC Managed Care – PPO | Admitting: Occupational Therapy

## 2013-10-17 ENCOUNTER — Encounter: Payer: BC Managed Care – PPO | Admitting: Occupational Therapy

## 2013-10-17 ENCOUNTER — Ambulatory Visit: Payer: BC Managed Care – PPO | Admitting: Physical Therapy

## 2013-10-21 ENCOUNTER — Ambulatory Visit: Payer: BC Managed Care – PPO | Admitting: Physical Therapy

## 2013-10-21 ENCOUNTER — Ambulatory Visit: Payer: BC Managed Care – PPO | Admitting: Occupational Therapy

## 2013-10-21 DIAGNOSIS — Z5189 Encounter for other specified aftercare: Secondary | ICD-10-CM | POA: Diagnosis not present

## 2013-10-24 ENCOUNTER — Ambulatory Visit: Payer: BC Managed Care – PPO | Admitting: Occupational Therapy

## 2013-10-24 ENCOUNTER — Ambulatory Visit: Payer: BC Managed Care – PPO | Admitting: Physical Therapy

## 2013-10-24 DIAGNOSIS — Z5189 Encounter for other specified aftercare: Secondary | ICD-10-CM | POA: Diagnosis not present

## 2013-10-31 ENCOUNTER — Ambulatory Visit: Payer: BC Managed Care – PPO | Attending: Neurology | Admitting: Physical Therapy

## 2013-10-31 ENCOUNTER — Ambulatory Visit: Payer: BC Managed Care – PPO | Admitting: Occupational Therapy

## 2013-10-31 DIAGNOSIS — R269 Unspecified abnormalities of gait and mobility: Secondary | ICD-10-CM | POA: Insufficient documentation

## 2013-10-31 DIAGNOSIS — Z5189 Encounter for other specified aftercare: Secondary | ICD-10-CM | POA: Insufficient documentation

## 2013-11-04 ENCOUNTER — Ambulatory Visit: Payer: BC Managed Care – PPO | Admitting: Occupational Therapy

## 2013-11-11 ENCOUNTER — Encounter: Payer: BC Managed Care – PPO | Admitting: Occupational Therapy

## 2013-11-14 ENCOUNTER — Ambulatory Visit: Payer: BC Managed Care – PPO | Admitting: Occupational Therapy

## 2014-01-19 ENCOUNTER — Other Ambulatory Visit: Payer: Self-pay | Admitting: Obstetrics & Gynecology

## 2014-05-04 ENCOUNTER — Encounter: Payer: Self-pay | Admitting: Neurology

## 2014-05-14 ENCOUNTER — Ambulatory Visit: Payer: BC Managed Care – PPO | Attending: Neurology | Admitting: Occupational Therapy

## 2014-05-14 ENCOUNTER — Ambulatory Visit: Payer: BC Managed Care – PPO | Admitting: Physical Therapy

## 2014-05-14 DIAGNOSIS — R279 Unspecified lack of coordination: Secondary | ICD-10-CM

## 2014-05-14 DIAGNOSIS — G20 Parkinson's disease: Secondary | ICD-10-CM

## 2014-05-14 NOTE — Therapy (Signed)
Patient Details  Name: Kristina Lewis MRN: [id number] Date of Birth: 03-29-1966  Encounter Date: 05/14/2014  Pt presents for PT screen today.  TUG score, gait velocity, and 5x sit<>stand appear within normal limits; however, pt reports several falls in the past several months.  Therapist discussed benefits from return to PT to address pt's balance concerns, recent history of falls.  Pt prefers to discuss possibility of therapy with Dr. Ardyth Man at visit on Monday, 05/18/14.   Keishon Chavarin W. 05/14/2014, 9:19 AM

## 2014-05-14 NOTE — Therapy (Signed)
  Patient Details  Name: Kristina Lewis MRN: [id number] Date of Birth: [birth year]  Encounter Date: 05/14/2014 Occupational Therapy Parkinson's Disease Screen  Physical Performance Test item #2 (simulated eating):  NT  Physical Performance Test item #4 (donning/doffing jacket):  9.03  9-hole peg test:    RUE  22.69        LUE  21.78    Standing Functional Reach Test:   RUE  11 in        LUE  12 in  Change in ability to perform ADLs/IADLs:  Pt reports he is slower with dressing. Pt reports change in meds due to dyskinesias.  Other Comments:  Pt sees MD next week, pt may request an OT order to address stiffness and speed with ADLs.  Pt would benefit from occupational therapy evaluation due to  Decreased speed with ADLs, decreased flexibility.  Pt does not require occupation therapy services at this time. Kathryn Rine, OTR/ L    RINE,KATHRYN 05/14/2014, 11:29 AM

## 2014-06-03 ENCOUNTER — Other Ambulatory Visit (HOSPITAL_COMMUNITY): Payer: Self-pay | Admitting: Physician Assistant

## 2014-06-03 DIAGNOSIS — M5020 Other cervical disc displacement, unspecified cervical region: Secondary | ICD-10-CM

## 2014-06-03 DIAGNOSIS — M5412 Radiculopathy, cervical region: Secondary | ICD-10-CM

## 2014-06-04 ENCOUNTER — Ambulatory Visit (HOSPITAL_COMMUNITY)
Admission: RE | Admit: 2014-06-04 | Discharge: 2014-06-04 | Disposition: A | Discharge status: Home / Self Care | Payer: BC Managed Care – PPO | Source: Ambulatory Visit | Attending: Physician Assistant | Admitting: Physician Assistant

## 2014-06-04 DIAGNOSIS — M5020 Other cervical disc displacement, unspecified cervical region: Secondary | ICD-10-CM

## 2014-06-04 DIAGNOSIS — M5412 Radiculopathy, cervical region: Secondary | ICD-10-CM

## 2014-06-04 DIAGNOSIS — M4802 Spinal stenosis, cervical region: Secondary | ICD-10-CM | POA: Insufficient documentation

## 2014-06-05 ENCOUNTER — Other Ambulatory Visit (HOSPITAL_COMMUNITY): Payer: BC Managed Care – PPO

## 2014-06-11 ENCOUNTER — Other Ambulatory Visit: Payer: Self-pay

## 2014-06-11 DIAGNOSIS — Z9882 Breast implant status: Secondary | ICD-10-CM

## 2014-06-11 DIAGNOSIS — Z1231 Encounter for screening mammogram for malignant neoplasm of breast: Secondary | ICD-10-CM

## 2014-06-23 ENCOUNTER — Ambulatory Visit: Payer: BC Managed Care – PPO | Admitting: Occupational Therapy

## 2014-06-23 ENCOUNTER — Ambulatory Visit: Payer: BC Managed Care – PPO | Admitting: Physical Therapy

## 2014-07-03 HISTORY — PX: HEMORRHOID BANDING: SHX5850

## 2014-09-30 ENCOUNTER — Emergency Department (HOSPITAL_COMMUNITY): Payer: BLUE CROSS/BLUE SHIELD

## 2014-09-30 ENCOUNTER — Emergency Department (HOSPITAL_COMMUNITY)
Admission: EM | Admit: 2014-09-30 | Discharge: 2014-09-30 | Disposition: A | Discharge status: Home / Self Care | Payer: BLUE CROSS/BLUE SHIELD | Attending: Emergency Medicine | Admitting: Emergency Medicine

## 2014-09-30 ENCOUNTER — Encounter (HOSPITAL_COMMUNITY): Payer: Self-pay | Admitting: *Deleted

## 2014-09-30 DIAGNOSIS — F419 Anxiety disorder, unspecified: Secondary | ICD-10-CM | POA: Diagnosis not present

## 2014-09-30 DIAGNOSIS — Z8739 Personal history of other diseases of the musculoskeletal system and connective tissue: Secondary | ICD-10-CM | POA: Diagnosis not present

## 2014-09-30 DIAGNOSIS — Z79899 Other long term (current) drug therapy: Secondary | ICD-10-CM | POA: Diagnosis not present

## 2014-09-30 DIAGNOSIS — R509 Fever, unspecified: Secondary | ICD-10-CM

## 2014-09-30 DIAGNOSIS — Z3202 Encounter for pregnancy test, result negative: Secondary | ICD-10-CM | POA: Diagnosis not present

## 2014-09-30 DIAGNOSIS — Z87448 Personal history of other diseases of urinary system: Secondary | ICD-10-CM | POA: Insufficient documentation

## 2014-09-30 DIAGNOSIS — Z8709 Personal history of other diseases of the respiratory system: Secondary | ICD-10-CM | POA: Insufficient documentation

## 2014-09-30 LAB — URINALYSIS, ROUTINE W REFLEX MICROSCOPIC
BILIRUBIN URINE: NEGATIVE
Glucose, UA: NEGATIVE mg/dL
HGB URINE DIPSTICK: NEGATIVE
KETONES UR: NEGATIVE mg/dL
Leukocytes, UA: NEGATIVE
Nitrite: NEGATIVE
Protein, ur: NEGATIVE mg/dL
Specific Gravity, Urine: 1.011 (ref 1.005–1.030)
Urobilinogen, UA: 0.2 mg/dL (ref 0.0–1.0)
pH: 7 (ref 5.0–8.0)

## 2014-09-30 LAB — CBC WITH DIFFERENTIAL/PLATELET
BASOS PCT: 1 % (ref 0–1)
Basophils Absolute: 0.1 10*3/uL (ref 0.0–0.1)
EOS ABS: 0.1 10*3/uL (ref 0.0–0.7)
Eosinophils Relative: 1 % (ref 0–5)
HEMATOCRIT: 38.4 % (ref 36.0–46.0)
HEMOGLOBIN: 12.8 g/dL (ref 12.0–15.0)
Lymphocytes Relative: 6 % — ABNORMAL LOW (ref 12–46)
Lymphs Abs: 0.5 10*3/uL — ABNORMAL LOW (ref 0.7–4.0)
MCH: 31.1 pg (ref 26.0–34.0)
MCHC: 33.3 g/dL (ref 30.0–36.0)
MCV: 93.2 fL (ref 78.0–100.0)
MONO ABS: 0.6 10*3/uL (ref 0.1–1.0)
Monocytes Relative: 7 % (ref 3–12)
Neutro Abs: 6.9 10*3/uL (ref 1.7–7.7)
Neutrophils Relative %: 85 % — ABNORMAL HIGH (ref 43–77)
PLATELETS: 267 10*3/uL (ref 150–400)
RBC: 4.12 MIL/uL (ref 3.87–5.11)
RDW: 12.9 % (ref 11.5–15.5)
WBC: 8.1 10*3/uL (ref 4.0–10.5)

## 2014-09-30 LAB — COMPREHENSIVE METABOLIC PANEL
ALBUMIN: 3.2 g/dL — AB (ref 3.5–5.2)
ALT: 58 U/L — AB (ref 0–35)
AST: 21 U/L (ref 0–37)
Alkaline Phosphatase: 173 U/L — ABNORMAL HIGH (ref 39–117)
Anion gap: 7 (ref 5–15)
BILIRUBIN TOTAL: 0.6 mg/dL (ref 0.3–1.2)
BUN: 8 mg/dL (ref 6–23)
CALCIUM: 9.7 mg/dL (ref 8.4–10.5)
CO2: 23 mmol/L (ref 19–32)
Chloride: 105 mmol/L (ref 96–112)
Creatinine, Ser: 0.92 mg/dL (ref 0.50–1.10)
GFR calc non Af Amer: 73 mL/min — ABNORMAL LOW (ref >90)
GFR, EST AFRICAN AMERICAN: 85 mL/min — AB (ref >90)
Glucose, Bld: 110 mg/dL — ABNORMAL HIGH (ref 70–99)
Potassium: 3.3 mmol/L — ABNORMAL LOW (ref 3.5–5.1)
SODIUM: 135 mmol/L (ref 135–145)
Total Protein: 5.7 g/dL — ABNORMAL LOW (ref 6.0–8.3)

## 2014-09-30 LAB — RAPID HIV SCREEN (HIV 1/2 AB+AG)
HIV 1/2 ANTIBODIES: NONREACTIVE
HIV-1 P24 ANTIGEN - HIV24: NONREACTIVE

## 2014-09-30 LAB — MONONUCLEOSIS SCREEN: Mono Screen: NEGATIVE

## 2014-09-30 LAB — POC URINE PREG, ED: PREG TEST UR: NEGATIVE

## 2014-09-30 LAB — URINE MICROSCOPIC-ADD ON

## 2014-09-30 MED ORDER — SODIUM CHLORIDE 0.9 % IV BOLUS (SEPSIS)
1000.0000 mL | Freq: Once | INTRAVENOUS | Status: AC
  Administered 2014-09-30: 1000 mL via INTRAVENOUS

## 2014-09-30 MED ORDER — KETOROLAC TROMETHAMINE 60 MG/2ML IM SOLN
60.0000 mg | Freq: Once | INTRAMUSCULAR | Status: AC
  Administered 2014-09-30: 60 mg via INTRAMUSCULAR
  Filled 2014-09-30: qty 2

## 2014-09-30 NOTE — ED Notes (Signed)
Pt is in stable condition upon d/c and ambulates from ED with family. 

## 2014-09-30 NOTE — ED Notes (Signed)
Pt reports flu like symptoms x 4 weeks, has been to pcp and reports elevated liver enzymes but was negative for hepatitis, states they did not test for mono. Symptoms include headache, fever, bodyaches, lack of appetite, weight loss.

## 2014-09-30 NOTE — ED Provider Notes (Signed)
CSN: [id number]     Arrival date & time 09/30/14  [id number] History   First MD Initiated Contact with Patient 09/30/14 1048     Chief Complaint  Patient presents with  . Fever     (Consider location/radiation/quality/duration/timing/severity/associated sxs/prior Treatment) HPI Comments: Patient presents with history of Parkinson's disease -- with complaint of intermittent fevers, chills, muscle aches, URI symptoms including nasal congestion and sore throat, weight loss, headache over the past 4 weeks. She has been seen by her primary care physician who ordered labs. Patient was found to have elevated transaminases. Hepatitis panel was reportedly negative. Patient has been treated with Tamiflu, azithromycin, Levaquin without relief of symptoms. During this time her children and husband tested positive for influenza. Patient reports 40 pound weight loss over the past year. She has had nausea but no vomiting. No diarrhea. No urinary symptoms. No skin rash. Patient does not report any tick bites prior to symptoms. No IV drug use. She was started on memantine for PD prior to the start of symptoms. No other new medications. She has been using ibuprofen as needed for fever which helps her symptoms. She is avoiding Tylenol because of her elevated liver enzymes. The onset of this condition was acute. The course is constant. Aggravating factors: none. Alleviating factors: none.    The history is provided by the patient.    Past Medical History  Diagnosis Date  . IC (interstitial cystitis)     bladder stimulator 10/2012  . Seasonal allergies   . Bulging disc     CERVICAL  . Anxiety   . Urge incontinence   . PONV (postoperative nausea and vomiting)   . Left leg pain     FELL 10-03-2012   Past Surgical History  Procedure Laterality Date  . Lasik      bilateral  . Cesarean section  12/2000  &  01-26-2003    X2  . Breast enhancement surgery  AGE 83  . Tonsillectomy  AGE 71'S  . Cysto/ urethral  dilation/ hod  X2  LAST ONE 12-19-2004  . Interstim implant placement N/A 10/08/2012    Procedure: INTERSTIM IMPLANT FIRST STAGE;  Surgeon: Martina SinnerScott A MacDiarmid, MD;  [facility];  Service: Urology;  Laterality: N/A;  . Interstim implant placement N/A 10/08/2012    Procedure: INTERSTIM IMPLANT SECOND STAGE;  Surgeon: Martina SinnerScott A MacDiarmid, MD;  [facility] [place];  Service: Urology;  Laterality: N/A;   Family History  Problem Relation Age of Onset  . Diabetes Mother     Died, 9875  . Diabetes Father     Living, 3681  . Cancer Father     prostate  . Hypertension Father   . Hypertension Maternal Grandmother   . Heart disease Maternal Grandmother   . Diabetes Paternal Grandfather   . Breast cancer Maternal Aunt   . Cancer Cousin     MATERNAL   History  Substance Use Topics  . Smoking status: Never Smoker   . Smokeless tobacco: Never Used  . Alcohol Use: No   OB History    Gravida Para Term Preterm AB TAB SAB Ectopic Multiple Living   3 2 2      1 3      Review of Systems  All other systems reviewed and are negative.   Allergies  Adhesive and Monistat  Home Medications   Prior to Admission medications   Medication Sig Start Date End Date Taking? Authorizing Provider  ALPRAZolam Prudy Feeler(XANAX) 0.5 MG  tablet Take 0.5 mg by mouth at bedtime as needed.    Historical Provider, MD  baclofen (LIORESAL) 10 MG tablet Take one tablet twice daily. 06/24/13   Donika Concha Se, DO  Ca Phosphate-Cholecalciferol (CALTRATE GUMMY BITES PO) Take 1 tablet by mouth daily.     Historical Provider, MD  clobetasol (TEMOVATE) 0.05 % external solution Apply 1 application topically 2 (two) times daily as needed (for scalp).  01/02/13   Historical Provider, MD  fish oil-omega-3 fatty acids 1000 MG capsule Take 4 g by mouth daily.     Historical Provider, MD  FLUoxetine (PROZAC) 10 MG capsule Take 10 mg by mouth daily.    Historical Provider, MD  hydrocortisone cream 1 % Apply 1  application topically 2 (two) times daily as needed for itching. 01/29/13   Ok Edwards, MD  ibuprofen (ADVIL,MOTRIN) 200 MG tablet Take 200 mg by mouth every 6 (six) hours as needed for pain.    Historical Provider, MD  lidocaine (LIDODERM) 5 % Place 1 patch onto the skin daily. Remove & Discard patch within 12 hours or as directed by MD 05/27/13   Glendale Chard, DO  loratadine (CLARITIN) 10 MG tablet Take 10 mg by mouth daily.    Historical Provider, MD  Magnesium 500 MG CAPS Take 1 capsule by mouth at bedtime.    Historical Provider, MD  zonisamide (ZONEGRAN) 100 MG capsule Take 100 mg by mouth daily.    Historical Provider, MD   BP 113/73 mmHg  Pulse 97  Temp(Src) 98 F (36.7 C) (Oral)  Resp 18  Ht 5' 3.5" (1.613 m)  Wt 105 lb 4.8 oz (47.764 kg)  BMI 18.36 kg/m2  SpO2 100%  LMP 09/25/2014   Physical Exam  Constitutional: She appears well-developed and well-nourished.  Pt is thin.  HENT:  Head: Normocephalic and atraumatic.  Right Ear: External ear normal.  Left Ear: External ear normal.  Nose: Nose normal.  Mouth/Throat: Oropharynx is clear and moist. No oropharyngeal exudate.  Eyes: Conjunctivae are normal. Pupils are equal, round, and reactive to light. Right eye exhibits no discharge. Left eye exhibits no discharge.  Neck: Normal range of motion. Neck supple.  Cardiovascular: Normal rate, regular rhythm and normal heart sounds.   No murmur heard. Pulmonary/Chest: Effort normal and breath sounds normal. No respiratory distress. She has no wheezes. She has no rales.  Abdominal: Soft. Bowel sounds are normal. There is no tenderness. There is no rebound and no guarding.  Musculoskeletal: She exhibits no edema or tenderness.  Lymphadenopathy:    She has no cervical adenopathy.  Neurological: She is alert.  Skin: Skin is warm and dry.  Psychiatric: She has a normal mood and affect.  Nursing note and vitals reviewed.   ED Course  Procedures (including critical care  time) Labs Review Labs Reviewed  URINALYSIS, ROUTINE W REFLEX MICROSCOPIC - Abnormal; Notable for the following:    APPearance TURBID (*)    All other components within normal limits  CBC WITH DIFFERENTIAL/PLATELET - Abnormal; Notable for the following:    Neutrophils Relative % 85 (*)    Lymphocytes Relative 6 (*)    Lymphs Abs 0.5 (*)    All other components within normal limits  COMPREHENSIVE METABOLIC PANEL - Abnormal; Notable for the following:    Potassium 3.3 (*)    Glucose, Bld 110 (*)    Total Protein 5.7 (*)    Albumin 3.2 (*)    ALT 58 (*)    Alkaline  Phosphatase 173 (*)    GFR calc non Af Amer 73 (*)    GFR calc Af Amer 85 (*)    All other components within normal limits  URINE MICROSCOPIC-ADD ON - Abnormal; Notable for the following:    Casts GRANULAR CAST (*)    All other components within normal limits  MONONUCLEOSIS SCREEN  RAPID HIV SCREEN (HIV 1/2 AB+AG)  POC URINE PREG, ED    Imaging Review Dg Chest 2 View  09/30/2014   CLINICAL DATA:  Fever.  Dry cough.  EXAM: CHEST  2 VIEW  COMPARISON:  None.  FINDINGS: The heart size and mediastinal contours are within normal limits. Both lungs are clear. The visualized skeletal structures are unremarkable.  IMPRESSION: No active cardiopulmonary disease.   Electronically Signed   By: Maisie Fus  Register   On: 09/30/2014 14:18     EKG Interpretation None      11:38 AM Patient seen and examined. Work-up initiated. Medications ordered.   Vital signs reviewed and are as follows: BP 113/73 mmHg  Pulse 97  Temp(Src) 98 F (36.7 C) (Oral)  Resp 18  Ht 5' 3.5" (1.613 m)  Wt 105 lb 4.8 oz (47.764 kg)  BMI 18.36 kg/m2  SpO2 100%  LMP 09/25/2014  2:50 PM Work-up reviewed with Dr. Jodi Mourning. Patient informed of results. No indications for admission at this time. Patient has remained afebrile in emergency department.  I've advised the patient to call her neurologist to see if they feel that the Namenda could be in part  causing her symptoms. I also feel that she may benefit from infectious disease input regarding her intermittent fevers and other symptoms.  Patient encouraged to return with worsening symptoms, high persistent fever, other concerns. Discussed return instructions with husband and patient.  MDM   Final diagnoses:  Intermittent fever of unknown origin   Patient with intermittent fevers over the past 4 weeks with other constellation of symptoms as described. Patient has been treated with Tamiflu, azithromycin, Levaquin without relief of symptoms. She has had elevated liver enzymes however reportedly negative hepatitis. Today she is HIV negative, mono negative, urine is clear, chest x-ray is clear. Patient is on a new medication however it is unclear if this is contributing to symptoms. No travel or tick bites. Lab work is otherwise reassuring. No indications for admission at this time. Follow-up as discussed above. Patient appears well, nontoxic. She has had some relief with Toradol and fluids in the emergency department.    Renne Crigler, PA-C 09/30/14 1454  Blane Ohara, MD 09/30/14 [phone]

## 2014-09-30 NOTE — Discharge Instructions (Signed)
Please read and follow all provided instructions.  Your diagnoses today include:  1. Intermittent fever of unknown origin     Tests performed today include:  Blood counts and electrolytes - normal  Liver function - AST 21, ALT 58, Alk Phos 173  Urine test - no infection  HIV test - no infection  Mono test - no infection  Chest x-ray - no infection  Vital signs. See below for your results today.   Medications prescribed:   None  Take any prescribed medications only as directed.  Home care instructions:  Follow any educational materials contained in this packet.   Follow-up instructions: Please follow-up with your primary care provider in the next 3 days for further evaluation of your symptoms.   Please call your neurologist and ask if they feel the memantine could be causing any of your symptoms.  Call the infectious disease doctor for an appointment to get their opinion as to why you are having these symptoms.   Return instructions:   Please return to the Emergency Department if you experience worsening symptoms.   Please return if you have any other emergent concerns.  Additional Information:  Your vital signs today were: BP 107/67 mmHg   Pulse 92   Temp(Src) 98.3 F (36.8 C) (Oral)   Resp 16   Ht 5' 3.5" (1.613 m)   Wt 105 lb 4.8 oz (47.764 kg)   BMI 18.36 kg/m2   SpO2 100%   LMP 09/25/2014 If your blood pressure (BP) was elevated above 135/85 this visit, please have this repeated by your doctor within one month. --------------

## 2014-09-30 NOTE — ED Notes (Signed)
I gave the patient a cup of ice water per nurse Joni ReiningNicole.

## 2014-10-02 ENCOUNTER — Telehealth (HOSPITAL_BASED_OUTPATIENT_CLINIC_OR_DEPARTMENT_OTHER): Payer: Self-pay | Admitting: Emergency Medicine

## 2014-10-13 ENCOUNTER — Ambulatory Visit: Payer: BLUE CROSS/BLUE SHIELD | Attending: Neurology

## 2014-10-13 DIAGNOSIS — G20 Parkinson's disease: Secondary | ICD-10-CM

## 2014-10-13 DIAGNOSIS — R490 Dysphonia: Secondary | ICD-10-CM

## 2014-10-13 NOTE — Therapy (Signed)
[facility] [address] [place], [place], [address] Phone: [phone]   Fax:  [phone]  Speech Language Pathology Evaluation  Patient Details  Name: Kristina Lewis MRN: [id number] Date of Birth: 08-23-1965 Referring Provider:  Edwina Barth,*  Encounter Date: 10/13/2014      End of Session - 10/13/14 1632    Visit Number 1   Number of Visits 16   Date for SLP Re-Evaluation 12/13/14   SLP Start Time 1540   SLP Stop Time  1629   SLP Time Calculation (min) 49 min   Activity Tolerance Patient tolerated treatment well      Past Medical History  Diagnosis Date  . IC (interstitial cystitis)     bladder stimulator 10/2012  . Seasonal allergies   . Bulging disc     CERVICAL  . Anxiety   . Urge incontinence   . PONV (postoperative nausea and vomiting)   . Left leg pain     FELL 10-03-2012    Past Surgical History  Procedure Laterality Date  . Lasik      bilateral  . Cesarean section  12/2000  &  01-26-2003    X2  . Breast enhancement surgery  AGE 49  . Tonsillectomy  AGE 49'S  . Cysto/ urethral dilation/ hod  X2  LAST ONE 12-19-2004  . Interstim implant placement N/A 10/08/2012    Procedure: INTERSTIM IMPLANT FIRST STAGE;  Surgeon: Martina Sinner, MD;  Location: [facility];  Service: Urology;  Laterality: N/A;  . Interstim implant placement N/A 10/08/2012    Procedure: INTERSTIM IMPLANT SECOND STAGE;  Surgeon: Martina Sinner, MD;  Location: [facility];  Service: Urology;  Laterality: N/A;    There were no vitals filed for this visit.  Visit Diagnosis: Hypokinetic Parkinsonian dysphonia      Subjective Assessment - 10/13/14 1556    Subjective "My family tells me they have difficulty understanding me hte last 6 months"   Patient is accompained by: --  alone            SLP Evaluation [facility] - 10/13/14 1557    SLP Visit Information   SLP Received On  10/13/14   Onset Date August 2013   Medical Diagnosis Parkinson's Disease   Pain Assessment   Pain Score 8    Pain Location Leg   Pain Orientation Left   Pain Type Chronic pain   General Information   HPI Pt diagnosed with PD January 2015 with symptoms of PD as easrly as August 2013.   Behavioral/Cognition Impulsive?   Auditory Comprehension   Overall Auditory Comprehension Appears within functional limits for tasks assessed   Oral Motor/Sensory Function   Overall Oral Motor/Sensory Function Impaired   Labial ROM Within Functional Limits   Labial Strength Reduced   Lingual ROM Within Functional Limits   Lingual Symmetry Within Functional Limits   Lingual Strength Within Functional Limits   Lingual Coordination Reduced   Facial Symmetry Within Functional Limits   Velum Within Functional Limits      15 minutes of conversational speech was reduced today, at average 69dB (WNL= average 70-72dB) with range of 64-72dB, when a sound level meter was placed 30 cm away from pt's mouth. Overall intelligibility for this listener in a quiet environment was approx 100%. Pt's /a/ was held at average 70dB with range of 65-71dB. Production of loud /a/ was assessed and averaged 83dB (range of 79 to 85) and min  cues rarely needed for loudness from the SLP. Pt rated effort level at 8/10 for production of loud /a/ (10=maximal effort).   Pt reports she had a modified barium swallow at [facility] and the results stated "my esophagus is slow." During the evaluation today, pt took consecutive sips thin liquid and began to cough. SLP encouraged pt to take single sips. When doing so x 7 for the remainder of the eval, no coughing observed. Pt told SLP that she feels that with pasta in particular that "it clogs up", pointing to her clavicular area. SLP to monitor pt's swallowing.   After eval tasks, pt was asked to use the same amount of effort in conversation as with the loud /a/. Loudness average with this increased  effort in 4 minutes conversation was 71dB (range of 67 to 74) with standby A for loudness. This is within normal limits. Pt would benefit from skilled ST in order to improve speech intelligibility and pt's QOL.          SLP Education - 10/13/14 1632    Education provided Yes   Education Details Therapy plan   Person(s) Educated Patient   Methods Explanation   Comprehension Verbalized understanding          SLP Short Term Goals - 10/13/14 1634    SLP SHORT TERM GOAL #1   Title pt will maintain average 83dB loud /a/ over 4 sessions   Time 4   Period Weeks   Status New   SLP SHORT TERM GOAL #2   Title pt will produce sentences with average 71dB 95% of the time   Time 4   Period Weeks   Status New   SLP SHORT TERM GOAL #3   Title pt will engage in simple conversation for 8 minutes with average 71dB   Time 4   Period Weeks   Status New          SLP Long Term Goals - 10/13/14 1637    SLP LONG TERM GOAL #1   Title pt will maintain /a/ average of 83dB over 6 sessions   Time 8   Period Weeks   Status New   SLP LONG TERM GOAL #2   Title pt will engage in 10 minutes mod compelx-complex conversation with average 71dB   Time 8   Period Weeks   Status New          Plan - 10/13/14 1633    Speech Therapy Frequency 2x / week   Duration --  8 weeks, or 16 visits   Treatment/Interventions Environmental controls;Compensatory strategies;SLP instruction and feedback;Patient/family education;Functional tasks;Cueing hierarchy   Potential to Achieve Goals Good        Problem List Patient Active Problem List   Diagnosis Date Noted  . External hemorrhoid 08/01/2012  . Constipation 08/01/2012  . Dysmenorrhea 02/15/2012  . Leukorrhea 02/15/2012  . Chronic headaches 02/15/2012  . Perimenopausal vasomotor symptoms 11/28/2011  . IC (interstitial cystitis) 08/17/2011    [facility], SLP 10/13/2014, 4:42 PM  [facility] [address] [place], [place], [address] Phone: [phone]   Fax:  [phone]

## 2014-10-13 NOTE — Patient Instructions (Signed)
Read out loud in 1 minute increments, for 5 minutes - twice a day.

## 2014-10-14 ENCOUNTER — Ambulatory Visit (INDEPENDENT_AMBULATORY_CARE_PROVIDER_SITE_OTHER): Payer: BLUE CROSS/BLUE SHIELD | Admitting: Gastroenterology

## 2014-10-14 ENCOUNTER — Other Ambulatory Visit: Payer: Self-pay

## 2014-10-14 ENCOUNTER — Encounter: Payer: Self-pay | Admitting: Gastroenterology

## 2014-10-14 VITALS — BP 101/60 | HR 73 | Temp 97.0°F | Ht 63.0 in | Wt 105.6 lb

## 2014-10-14 DIAGNOSIS — K219 Gastro-esophageal reflux disease without esophagitis: Secondary | ICD-10-CM

## 2014-10-14 DIAGNOSIS — K625 Hemorrhage of anus and rectum: Secondary | ICD-10-CM | POA: Diagnosis not present

## 2014-10-14 DIAGNOSIS — R634 Abnormal weight loss: Secondary | ICD-10-CM

## 2014-10-14 DIAGNOSIS — R1314 Dysphagia, pharyngoesophageal phase: Secondary | ICD-10-CM | POA: Diagnosis not present

## 2014-10-14 MED ORDER — PANTOPRAZOLE SODIUM 40 MG PO TBEC: 40.0000 mg | DELAYED_RELEASE_TABLET | Freq: Every day | ORAL | Status: DC

## 2014-10-14 MED ORDER — PEG 3350-KCL-NA BICARB-NACL 420 G PO SOLR: 4000.0000 mL | Freq: Once | ORAL | Status: DC

## 2014-10-14 MED ORDER — HYDROCORTISONE 2.5 % RE CREA: 1.0000 "application " | TOPICAL_CREAM | Freq: Two times a day (BID) | RECTAL | Status: DC

## 2014-10-14 NOTE — Progress Notes (Signed)
Primary Care Physician:  Lenise HeraldMANN, BENJAMIN, PA-C Primary Gastroenterologist:  Dr. Jena Gaussourk   Chief Complaint  Patient presents with  . Dysphagia  . Gastrophageal Reflux    HPI:   Kristina Lewis is a 49 y.o. female presenting today as a self-referral secondary to severe GERD symptoms and dysphagia.   Parkinson's disease diagnosed Jan 2015. Diagn[facility] at [facility]. States symptoms originally began in 2013.   Notes reflux, chest discomfort, difficulty swallowing fish oil pills. Used to be unable to swallow large pills now also small pills. When eating bread, it feels like a large clump in throat.   Used to weigh 144, now 105. Lost weight over a year. Globus sensation. 6 months of symptoms of globus sensation, with pill and solid food dysphagia for about 2-3 months.  Could care less about eating. Gets nauseated pretty easily. Nausea after eating several bites. No PPI currently. Had flu like symptoms around Feb. [facility] through [facility]rep. No help with Azithromycin. No improvement with stronger abx. Started running tests, saw LFTs were elevated. [facility] swallow [facility]. LFTs in interim have normalized. Need to obtain labs.   No melena. Notes paper hematochezia. Alternating diarrhea and constipation. Chronic. More constipation predominant. No OTC agents. No prior colonoscopy or upper endoscopy. After eating bread, doubles over in pain several hours later. Hard to narrow down to what is causing symptoms. Some rectal discomfort with defecation.   [facility] completed labs recently to include viral markers per patient.   Past Medical History  Diagnosis Date  . IC (interstitial cystitis)     bladder stimulator 10/2012  . Seasonal allergies   . Bulging disc     CERVICAL  . Anxiety   . Urge incontinence   . PONV (postoperative nausea and vomiting)   . Left leg pain   . Parkinson's disease     Past Surgical History  Procedure Laterality Date  . Lasik      bilateral  . Cesarean  section  12/2000  &  01-26-2003    X2  . Breast enhancement surgery  AGE 33  . Tonsillectomy  AGE 66'S  . Cysto/ urethral dilation/ hod  X2  LAST ONE 12-19-2004  . Interstim implant placement N/A 10/08/2012    Procedure: INTERSTIM IMPLANT FIRST STAGE;  Surgeon: Martina SinnerScott A MacDiarmid, MD;  [facility] [place];  Service: Urology;  Laterality: N/A;  . Interstim implant placement N/A 10/08/2012    Procedure: INTERSTIM IMPLANT SECOND STAGE;  Surgeon: Martina SinnerScott A MacDiarmid, MD;  [facility] [place];  Service: Urology;  Laterality: N/A;    Current Outpatient Prescriptions  Medication Sig Dispense Refill  . ALPRAZolam (XANAX) 0.5 MG tablet Take 0.5 mg by mouth at bedtime as needed.    . ALPRAZolam (XANAX) 1 MG tablet Take 1.5 mg by mouth at bedtime as needed for anxiety.    . baclofen (LIORESAL) 10 MG tablet Take one tablet twice daily. 60 each 3  . Carbidopa-Levodopa (RYTARY PO) Take by mouth.    . clobetasol (TEMOVATE) 0.05 % external solution Apply 1 application topically 2 (two) times daily as needed (for scalp).     . fish oil-omega-3 fatty acids 1000 MG capsule Take 4 g by mouth daily.     Marland Kitchen. FLUoxetine (PROZAC) 10 MG capsule Take 10 mg by mouth daily.    Marland Kitchen. ibuprofen (ADVIL,MOTRIN) 200 MG tablet Take 200 mg by mouth every 6 (six) hours as needed for pain.    .Marland Kitchen  zonisamide (ZONEGRAN) 100 MG capsule Take 400 mg by mouth daily.      No current facility-administered medications for this visit.    Allergies as of 10/14/2014 - Review Complete 10/14/2014  Allergen Reaction Noted  . Adhesive [tape] Itching 10/04/2012  . Monistat [miconazole] Swelling 11/28/2011    Family History  Problem Relation Age of Onset  . Diabetes Mother     Died, 42  . Diabetes Father     Living, 67  . Cancer Father     prostate  . Hypertension Father   . Hypertension Maternal Grandmother   . Heart disease Maternal Grandmother   . Diabetes Paternal Grandfather   . Breast cancer Maternal  Aunt   . Cancer Cousin     MATERNAL  . Colon cancer Neg Hx     History   Social History  . Marital Status: Married    Spouse Name: N/A  . Number of Children: 3  . Years of Education: N/A   Occupational History  . Not on file.   Social History Main Topics  . Smoking status: Never Smoker   . Smokeless tobacco: Never Used  . Alcohol Use: No  . Drug Use: No  . Sexual Activity: Not on file   Other Topics Concern  . 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  She had three healthy children at home.  Husband is Programmer, multimedia.      Review of Systems: Gen: see HPI CV: see HPI Resp: +DOE GI: see HPI GU : +urinary burning MS: left leg stiffness, pain, pain Derm: Denies rash, itching, dry skin Psych: +anxiety Heme: Denies bruising, bleeding, and enlarged lymph nodes.  Physical Exam: BP 101/60 mmHg  Pulse 73  Temp(Src) 97 F (36.1 C)  Ht  (1.6 m)  Wt 105 lb 9.6 oz (47.9 kg)  BMI 18.71 kg/m2  LMP 09/25/2014 General:   Alert and oriented. Pleasant and cooperative. Thin.  Head:  Normocephalic and atraumatic. Eyes:  Without icterus, sclera clear and conjunctiva pink.  Ears:  Normal auditory acuity. Nose:  No deformity, discharge,  or lesions. Mouth:  No deformity or lesions, oral mucosa pink.  Lungs:  Clear to auscultation bilaterally. No wheezes, rales, or rhonchi. No distress.  Heart:  S1, S2 present without murmurs appreciated.  Abdomen:  +BS, soft, mild TTP upper abdomen and non-distended. No HSM noted. No guarding or rebound. No masses appreciated.  Rectal:  Deferred  Msk:  Symmetrical without gross deformities. Normal posture. Extremities:  Without edema. Neurologic:  Alert and  oriented x4 Skin:  Intact without significant lesions or rashes. Psych:  Alert and cooperative. Normal mood and affect.  Lab Results  Component Value Date   ALT 58* 09/30/2014   AST 21 09/30/2014   ALKPHOS 173* 09/30/2014   BILITOT 0.6 09/30/2014   Lab Results    Component Value Date   WBC 8.1 09/30/2014   HGB 12.8 09/30/2014   HCT 38.4 09/30/2014   MCV 93.2 09/30/2014   PLT 267 09/30/2014   Lab Results  Component Value Date   CREATININE 0.92 09/30/2014   BUN 8 09/30/2014   NA 135 09/30/2014   K 3.3* 09/30/2014   CL 105 09/30/2014   CO2 23 09/30/2014

## 2014-10-14 NOTE — Patient Instructions (Signed)
We have scheduled you for a colonoscopy, upper endoscopy, and possible dilation with Dr. Jena Gaussourk in the near future.  I have sent Protonix to your pharmacy to start taking 30 minutes before breakfast daily for reflux. If this is not covered well per insurance, please let me know.  I have also sent a cream to your pharmacy to use per rectum twice a day for 7 days.

## 2014-10-15 DIAGNOSIS — R634 Abnormal weight loss: Secondary | ICD-10-CM | POA: Insufficient documentation

## 2014-10-15 DIAGNOSIS — K219 Gastro-esophageal reflux disease without esophagitis: Secondary | ICD-10-CM | POA: Insufficient documentation

## 2014-10-15 NOTE — Assessment & Plan Note (Signed)
49 year old pleasant female with significant GERD symptoms to include chest discomfort, soft food and pill dysphagia, globus sensation, associated nausea, and notable weight loss of around 40 lbs, [facility]. No records available at time of visit. Notable history of Parkinson's. Not currently on a PPI. Constellation of symptoms concerning, with need for upper endoscopy +/- dilation in the near future. Need esophagram reports as soon as possible for evaluation prior to EGD. As she is not on a PPI, will send in Protonix to start once daily for now.   Proceed with upper endoscopy/dilation in the near future with Dr. Jena Gaussourk. The risks, benefits, and alternatives have been discussed in detail with patient. They have stated understanding and desire to proceed.  PROPOFOL due to polypharmacy Outside records requested Protonix prescription sent to pharmacy

## 2014-10-15 NOTE — Assessment & Plan Note (Signed)
Likely multifactorial in the setting of decreased oral intake; however, her significant weight loss is concerning for occult process. Proceed with EGD/TCS as planned. May need CT abd/pelvis.

## 2014-10-15 NOTE — Assessment & Plan Note (Signed)
In setting of alternating diarrhea/constipation. Likely benign anorectal source but unable to exclude occult malignancy. With weight loss, needs further lower GI evaluation in the way of colonoscopy with pediatric colonoscope.   Proceed with TCS with Dr. Jena Gaussourk in near future: the risks, benefits, and alternatives have been discussed with the patient in detail. The patient states understanding and desires to proceed. PROPOFOL due to polypharmacy Pediatric colonoscope Proctosol cream BID per rectum X 7 days

## 2014-10-15 NOTE — Assessment & Plan Note (Signed)
Query secondary to uncontrolled GERD, web, ring, stricture, esophageal dysmotility. Need to obtain records from [facility]. Proceed with EGD/ED as planned.

## 2014-10-16 ENCOUNTER — Telehealth: Payer: Self-pay

## 2014-10-16 NOTE — Progress Notes (Signed)
I called to discuss symptoms and her planned evaluation with patient. Left message on voicemail.  Endoscopic evaluation needs to be significantly expedited.  Would like to see her endoscopic evaluation take place next week.

## 2014-10-16 NOTE — Progress Notes (Signed)
[facility]: Please make sure it is notated that she needs a pediatric colonoscope.

## 2014-10-16 NOTE — Progress Notes (Signed)
cc'ed to pcp °

## 2014-10-16 NOTE — Progress Notes (Signed)
I sent a request to PCP for them to send us the viral markers.

## 2014-10-16 NOTE — Progress Notes (Signed)
I reviewed [facility] records again, and a new note has been filed from speech therapy. She did not undergo a BPE. She had a videofluoroscopic swallow study completed by Speech Pathologist.   As noted by speech pathologist under assessment: Patient presents with oropharyngeal swallow within gross functional limits in the setting of Parkinsonism. Pt demonstrated adequate airway protection evidenced by no aspiration visualized throughout the study; however, pt was also noted to have mid-distal esophageal retention with intra-esophageal retrograde flow. She may benefit from follow up with a GI specialist for further esophageal workup, especially given reports of globus sensation.    Outside labs from October 07, 2014 show Alk Phos at 158, ALT 35, both improved from 09/30/14 in ED.   Still need viral markers from PCP, which patient states were completed.   Patient is currently scheduled about a month from now. We need to have this moved up sooner, within the next week or 2 if at all possible.

## 2014-10-16 NOTE — Telephone Encounter (Signed)
SLP called pt due to receiving inbasket message stating pt called on 10-15-14 and cancelled all appointments made following her speech eval on 10-13-14 due to "upcoming surgery". As per EPIC, a procedure is not scheduled until mid-May so SLP called for clarification. SLP awaiting pt to return call.

## 2014-10-19 NOTE — Progress Notes (Signed)
[facility] to [facility], please see note below from [facility]

## 2014-10-19 NOTE — Progress Notes (Signed)
Called pt and she is aware of procedure date changes. She is scheduled for 10/27/2014 @ 9:45am.  Pt is aware of all instructions and new pre-op appt on 10/21/2014 @ 245pm

## 2014-10-19 NOTE — Progress Notes (Signed)
No pre-cert is needed per [facility]. Ref # [id number]

## 2014-10-20 ENCOUNTER — Ambulatory Visit: Payer: BLUE CROSS/BLUE SHIELD | Admitting: Speech Pathology

## 2014-10-21 ENCOUNTER — Encounter (HOSPITAL_COMMUNITY)
Admission: RE | Admit: 2014-10-21 | Discharge: 2014-10-21 | Disposition: A | Discharge status: Home / Self Care | Payer: BLUE CROSS/BLUE SHIELD | Source: Ambulatory Visit | Attending: Internal Medicine | Admitting: Internal Medicine

## 2014-10-22 ENCOUNTER — Encounter (HOSPITAL_COMMUNITY): Payer: Self-pay

## 2014-10-27 ENCOUNTER — Ambulatory Visit (HOSPITAL_COMMUNITY): Payer: BLUE CROSS/BLUE SHIELD | Admitting: Anesthesiology

## 2014-10-27 ENCOUNTER — Encounter (HOSPITAL_COMMUNITY)
Admission: RE | Disposition: A | Discharge status: Home / Self Care | Payer: Self-pay | Source: Ambulatory Visit | Attending: Internal Medicine

## 2014-10-27 ENCOUNTER — Ambulatory Visit (HOSPITAL_COMMUNITY)
Admission: RE | Admit: 2014-10-27 | Discharge: 2014-10-27 | Disposition: A | Discharge status: Home / Self Care | Payer: BLUE CROSS/BLUE SHIELD | Source: Ambulatory Visit | Attending: Internal Medicine | Admitting: Internal Medicine

## 2014-10-27 ENCOUNTER — Encounter (HOSPITAL_COMMUNITY): Payer: Self-pay | Admitting: *Deleted

## 2014-10-27 ENCOUNTER — Encounter: Payer: BLUE CROSS/BLUE SHIELD | Admitting: Speech Pathology

## 2014-10-27 DIAGNOSIS — Z79899 Other long term (current) drug therapy: Secondary | ICD-10-CM | POA: Insufficient documentation

## 2014-10-27 DIAGNOSIS — K5900 Constipation, unspecified: Secondary | ICD-10-CM | POA: Diagnosis present

## 2014-10-27 DIAGNOSIS — R1319 Other dysphagia: Secondary | ICD-10-CM | POA: Diagnosis present

## 2014-10-27 DIAGNOSIS — K648 Other hemorrhoids: Secondary | ICD-10-CM | POA: Insufficient documentation

## 2014-10-27 DIAGNOSIS — K219 Gastro-esophageal reflux disease without esophagitis: Secondary | ICD-10-CM | POA: Diagnosis not present

## 2014-10-27 DIAGNOSIS — G20 Parkinson's disease: Secondary | ICD-10-CM | POA: Diagnosis not present

## 2014-10-27 DIAGNOSIS — R1314 Dysphagia, pharyngoesophageal phase: Secondary | ICD-10-CM | POA: Diagnosis not present

## 2014-10-27 DIAGNOSIS — R634 Abnormal weight loss: Secondary | ICD-10-CM | POA: Diagnosis not present

## 2014-10-27 DIAGNOSIS — Z791 Long term (current) use of non-steroidal anti-inflammatories (NSAID): Secondary | ICD-10-CM | POA: Insufficient documentation

## 2014-10-27 DIAGNOSIS — Z7952 Long term (current) use of systemic steroids: Secondary | ICD-10-CM | POA: Diagnosis not present

## 2014-10-27 DIAGNOSIS — K640 First degree hemorrhoids: Secondary | ICD-10-CM | POA: Insufficient documentation

## 2014-10-27 DIAGNOSIS — F419 Anxiety disorder, unspecified: Secondary | ICD-10-CM | POA: Insufficient documentation

## 2014-10-27 DIAGNOSIS — K921 Melena: Secondary | ICD-10-CM | POA: Diagnosis present

## 2014-10-27 HISTORY — PX: ESOPHAGOGASTRODUODENOSCOPY (EGD) WITH PROPOFOL: SHX5813

## 2014-10-27 HISTORY — PX: COLONOSCOPY WITH PROPOFOL: SHX5780

## 2014-10-27 HISTORY — PX: ESOPHAGEAL DILATION: SHX303

## 2014-10-27 HISTORY — PX: BIOPSY: SHX5522

## 2014-10-27 LAB — HEPATIC FUNCTION PANEL
ALK PHOS: 71 U/L (ref 39–117)
ALT: 6 U/L (ref 0–35)
AST: 22 U/L (ref 0–37)
Albumin: 3.9 g/dL (ref 3.5–5.2)
Total Bilirubin: 0.6 mg/dL (ref 0.3–1.2)
Total Protein: 6.1 g/dL (ref 6.0–8.3)

## 2014-10-27 LAB — PREGNANCY, URINE: Preg Test, Ur: NEGATIVE

## 2014-10-27 SURGERY — COLONOSCOPY WITH PROPOFOL
Anesthesia: Monitor Anesthesia Care

## 2014-10-27 MED ORDER — PROPOFOL 10 MG/ML IV BOLUS
INTRAVENOUS | Status: AC
  Filled 2014-10-27: qty 20

## 2014-10-27 MED ORDER — FENTANYL CITRATE (PF) 250 MCG/5ML IJ SOLN
INTRAMUSCULAR | Status: DC | PRN
  Administered 2014-10-27: 50 ug via INTRAVENOUS

## 2014-10-27 MED ORDER — LIDOCAINE VISCOUS 2 % MT SOLN
2.5000 mL | Freq: Once | OROMUCOSAL | Status: AC
  Administered 2014-10-27: 2.5 mL via OROMUCOSAL

## 2014-10-27 MED ORDER — LIDOCAINE VISCOUS 2 % MT SOLN
OROMUCOSAL | Status: AC
  Filled 2014-10-27: qty 15

## 2014-10-27 MED ORDER — MIDAZOLAM HCL 5 MG/5ML IJ SOLN
INTRAMUSCULAR | Status: DC | PRN
  Administered 2014-10-27: 2 mg via INTRAVENOUS

## 2014-10-27 MED ORDER — ONDANSETRON HCL 4 MG/2ML IJ SOLN
INTRAMUSCULAR | Status: AC
  Filled 2014-10-27: qty 2

## 2014-10-27 MED ORDER — FENTANYL CITRATE (PF) 100 MCG/2ML IJ SOLN
25.0000 ug | Freq: Once | INTRAMUSCULAR | Status: AC
  Administered 2014-10-27: 25 ug via INTRAVENOUS

## 2014-10-27 MED ORDER — SIMETHICONE 40 MG/0.6ML PO SUSP
ORAL | Status: DC | PRN
  Administered 2014-10-27: 1000 mL

## 2014-10-27 MED ORDER — MIDAZOLAM HCL 2 MG/2ML IJ SOLN
1.0000 mg | INTRAMUSCULAR | Status: DC | PRN
  Administered 2014-10-27 (×2): 2 mg via INTRAVENOUS
  Filled 2014-10-27: qty 2

## 2014-10-27 MED ORDER — ONDANSETRON HCL 4 MG/2ML IJ SOLN
4.0000 mg | Freq: Once | INTRAMUSCULAR | Status: AC
  Administered 2014-10-27: 4 mg via INTRAVENOUS

## 2014-10-27 MED ORDER — MIDAZOLAM HCL 2 MG/2ML IJ SOLN
INTRAMUSCULAR | Status: AC
  Filled 2014-10-27: qty 2

## 2014-10-27 MED ORDER — FENTANYL CITRATE (PF) 100 MCG/2ML IJ SOLN
INTRAMUSCULAR | Status: AC
  Filled 2014-10-27: qty 2

## 2014-10-27 MED ORDER — ONDANSETRON HCL 4 MG/2ML IJ SOLN: 4.0000 mg | Freq: Once | INTRAMUSCULAR | Status: DC | PRN

## 2014-10-27 MED ORDER — PROPOFOL 10 MG/ML IV BOLUS
INTRAVENOUS | Status: DC | PRN
  Administered 2014-10-27 (×3): 20 mg via INTRAVENOUS

## 2014-10-27 MED ORDER — PROPOFOL INFUSION 10 MG/ML OPTIME
INTRAVENOUS | Status: DC | PRN
  Administered 2014-10-27: 125 ug/kg/min via INTRAVENOUS
  Administered 2014-10-27: 11:00:00 via INTRAVENOUS

## 2014-10-27 MED ORDER — FENTANYL CITRATE (PF) 100 MCG/2ML IJ SOLN: 25.0000 ug | INTRAMUSCULAR | Status: DC | PRN

## 2014-10-27 MED ORDER — MEPERIDINE HCL 50 MG/ML IJ SOLN: 6.2500 mg | Freq: Once | INTRAMUSCULAR | Status: DC

## 2014-10-27 MED ORDER — LACTATED RINGERS IV SOLN
INTRAVENOUS | Status: DC
  Administered 2014-10-27: 10:00:00 via INTRAVENOUS

## 2014-10-27 SURGICAL SUPPLY — 36 items
BALLN CRE LF 10-12 240X5.5 (BALLOONS)
BALLN DILATOR CRE 12-15 240 (BALLOONS)
BALLN DILATOR CRE 15-18 240 (BALLOONS) IMPLANT
BALLN DILATOR CRE 18-20 240 (BALLOONS) IMPLANT
BALLN DILATOR CRE WIREGUIDE (BALLOONS)
BALLOON CRE LF 10-12 240X5.5 (BALLOONS) IMPLANT
BALLOON DILATOR CRE 12-15 240 (BALLOONS) IMPLANT
BALLOON DILATOR CRE WIREGUIDE (BALLOONS) IMPLANT
BLOCK BITE 60FR ADLT L/F BLUE (MISCELLANEOUS) ×1 IMPLANT
DEVICE CLIP HEMOSTAT 235CM (CLIP) IMPLANT
ELECT REM PT RETURN 9FT ADLT (ELECTROSURGICAL)
ELECTRODE REM PT RTRN 9FT ADLT (ELECTROSURGICAL) IMPLANT
FCP BXJMBJMB 240X2.8X (CUTTING FORCEPS)
FLOOR PAD 36X40 (MISCELLANEOUS) ×2
FORCEPS BIOP RAD 4 LRG CAP 4 (CUTTING FORCEPS) ×1 IMPLANT
FORCEPS BIOP RJ4 240 W/NDL (CUTTING FORCEPS)
FORCEPS BXJMBJMB 240X2.8X (CUTTING FORCEPS) IMPLANT
FORMALIN 10 PREFIL 20ML (MISCELLANEOUS) ×2 IMPLANT
INJECTOR/SNARE I SNARE (MISCELLANEOUS) IMPLANT
KIT CLEAN ENDO COMPLIANCE (KITS) ×2 IMPLANT
LUBRICANT JELLY 4.5OZ STERILE (MISCELLANEOUS) ×1 IMPLANT
MANIFOLD NEPTUNE II (INSTRUMENTS) ×1 IMPLANT
NDL SCLEROTHERAPY 25GX240 (NEEDLE) IMPLANT
NEEDLE SCLEROTHERAPY 25GX240 (NEEDLE) IMPLANT
PAD FLOOR 36X40 (MISCELLANEOUS) IMPLANT
PROBE APC STR FIRE (PROBE) IMPLANT
PROBE INJECTION GOLD (MISCELLANEOUS)
PROBE INJECTION GOLD 7FR (MISCELLANEOUS) IMPLANT
SNARE ROTATE MED OVAL 20MM (MISCELLANEOUS) IMPLANT
SNARE SHORT THROW 13M SML OVAL (MISCELLANEOUS) ×1 IMPLANT
SYR 50ML LL SCALE MARK (SYRINGE) ×1 IMPLANT
SYR INFLATE BILIARY GAUGE (MISCELLANEOUS) IMPLANT
SYR INFLATION 60ML (SYRINGE) ×1 IMPLANT
TRAP SPECIMEN MUCOUS 40CC (MISCELLANEOUS) IMPLANT
TUBING IRRIGATION ENDOGATOR (MISCELLANEOUS) ×1 IMPLANT
WATER STERILE IRR 1000ML POUR (IV SOLUTION) ×1 IMPLANT

## 2014-10-27 NOTE — Anesthesia Preprocedure Evaluation (Addendum)
Anesthesia Evaluation  Patient identified by MRN, date of birth, ID band Patient awake    Reviewed: Allergy & Precautions, NPO status , Patient's Chart, lab work & pertinent test results  History of Anesthesia Complications (+) PONV and history of anesthetic complications  Airway Mallampati: I  TM Distance: >3 FB     Dental  (+) Teeth Intact   Pulmonary neg pulmonary ROS,  breath sounds clear to auscultation        Cardiovascular negative cardio ROS  Rhythm:Regular Rate:Normal     Neuro/Psych  Headaches, PSYCHIATRIC DISORDERS Anxiety Parkinson's    GI/Hepatic GERD-  Medicated,  Endo/Other    Renal/GU      Musculoskeletal   Abdominal   Peds  Hematology   Anesthesia Other Findings   Reproductive/Obstetrics                            Anesthesia Physical Anesthesia Plan  ASA: III  Anesthesia Plan: MAC   Post-op Pain Management:    Induction: Intravenous  Airway Management Planned: Simple Face Mask  Additional Equipment:   Intra-op Plan:   Post-operative Plan:   Informed Consent: I have reviewed the patients History and Physical, chart, labs and discussed the procedure including the risks, benefits and alternatives for the proposed anesthesia with the patient or authorized representative who has indicated his/her understanding and acceptance.     Plan Discussed with:   Anesthesia Plan Comments:         Anesthesia Quick Evaluation

## 2014-10-27 NOTE — Op Note (Signed)
[facility] [address] [place], [id number]   COLONOSCOPY PROCEDURE REPORT  PATIENT: Kristina Lewis, Kristina Lewis  MR#: [id number] BIRTHDATE: 08-03-1965 , 48  yrs. old GENDER: female ENDOSCOPIST: R.  Roetta SessionsMichael Estuardo Frisbee, MD FACP [facility] REFERRED QI:ONGEXBBY:Kellie Goldsborough, M.D. PROCEDURE DATE:  10/27/2014 PROCEDURE:   Ileo-colonoscopy - diagnostic INDICATIONS:hematochezia; constipation. MEDICATIONS: deep sedation per [facility]  [facility] ASA CLASS:       Class II  CONSENT: The risks, benefits, alternatives and imponderables including but not limited to bleeding, perforation as well as the possibility of a missed lesion have been reviewed.  The potential for biopsy, lesion removal, etc. have also been discussed. Questions have been answered.  All parties agreeable.  Please see the history and physical in the medical record for more information.  DESCRIPTION OF PROCEDURE:   After the risks benefits and alternatives of the procedure were thoroughly explained, informed consent was obtained.  The digital rectal exam revealed no abnormalities of the rectum.   The     endoscope was introduced through the anus and advanced to the terminal ileum which was intubated for a short distance. No adverse events experienced. The quality of the prep was adequate  The instrument was then slowly withdrawn as the colon was fully examined.      COLON FINDINGS: Minimal internal hemorrhoids seen on retroflexion; otherwise, normal-appearing rectal mucosa.  Normal-appearing colonic mucosa.  The distal 5 cm of terminal ileum mucosa also appeared normal.  Retroflexion was performed. .  Withdrawal time=9 minutes 0 seconds.  The scope was withdrawn and the procedure completed. COMPLICATIONS: There were no immediate complications.  ENDOSCOPIC IMPRESSION: Minimal internal hemorrhoids; otherwise normal rectum,  colon and terminal ileum  RECOMMENDATIONS: Ten-day course of Anusol suppositories. Begin  Benefiber 2 teaspoons twice daily. See EGD report.  eSigned:  R. Roetta SessionsMichael Teresa Lemmerman, MD Jerrel IvoryFACP [facility] 10/27/2014 12:08 PM   cc:  CPT CODES: ICD CODES:  The ICD and CPT codes recommended by this software are interpretations from the data that the clinical staff has captured with the software.  The verification of the translation of this report to the ICD and CPT codes and modifiers is the sole responsibility of the health care institution and practicing physician where this report was generated.  [employer] will not be held responsible for the validity of the ICD and CPT codes included on this report.  AMA assumes no liability for data contained or not contained herein. CPT is a Publishing rights managerregistered trademark of the [employer].  PATIENT NAME:  Kristina Lewis, Kristina Lewis MR#: [id number]

## 2014-10-27 NOTE — Transfer of Care (Signed)
Immediate Anesthesia Transfer of Care Note  Patient: Kristina Lewis  Procedure(s) Performed: Procedure(s) with comments: COLONOSCOPY WITH PROPOFOL (N/A) - Cecum time in 1128 time out 1137      total time 9 minutes ESOPHAGOGASTRODUODENOSCOPY (EGD) WITH PROPOFOL (N/A) - procedure 1 ESOPHAGEAL DILATION (N/A) - Maloney 52 BIOPSY (N/A) - duodenal, esophageal  Patient Location: PACU  Anesthesia Type:MAC  Level of Consciousness: awake, alert  and oriented  Airway & Oxygen Therapy: Patient Spontanous Breathing  Post-op Assessment: Report given to RN  Post vital signs: Reviewed and stable  Last Vitals:  Filed Vitals:   10/27/14 1045  BP: 108/69  Pulse:   Temp:   Resp: 16    Complications: No apparent anesthesia complications

## 2014-10-27 NOTE — OR Nursing (Signed)
Dr. Jayme CloudGonzalez notified of patient having a bladder stimulator and said ok for patient to leave on.

## 2014-10-27 NOTE — Op Note (Signed)
[facility] [address] [place], [id number]   ENDOSCOPY PROCEDURE REPORT  PATIENT: Kristina Lewis, Kristina Lewis  MR#: [id number] BIRTHDATE: 1965-09-23 , 48  yrs. old GENDER: female ENDOSCOPIST: R.  Roetta SessionsMichael Jamiel Goncalves, MD FACP FACG REFERRED BY:  Assunta FoundJohn Golding, M.D. PROCEDURE DATE:  10/27/2014 PROCEDURE:  EGD w/ biopsy and Maloney dilation of esophagus INDICATIONS:  refractory GERD; esophageal dysphagia; weight loss. MEDICATIONS: Deep sedation per Dr[facility]  [facility] ASA CLASS:      Class II  CONSENT: The risks, benefits, limitations, alternatives and imponderables have been discussed.  The potential for biopsy, esophogeal dilation, etc. have also been reviewed.  Questions have been answered.  All parties agreeable.  Please see the history and physical in the medical record for more information.  DESCRIPTION OF PROCEDURE: After the risks benefits and alternatives of the procedure were thoroughly explained, informed consent was obtained.  The    endoscope was introduced through the mouth and advanced to the second portion of the duodenum , limited by Without limitations. The instrument was slowly withdrawn as the mucosa was fully examined.    Normal-appearing, widely patent tubular esophagus.  Stomach empty. Normal-appearing gastric mucosa.  No hiatal hernia.  Patent pylorus.  Normal-appearing first, second and third portion of the duodenum  Scope was withdrawn and a 52 [place] Maloney dilators passed full insertion easily.  A look back revealed no apparent complication related to this maneuver.  Subsequently, biopsies the mid and distal esophageal mucosa taken for histologic study.  Finally, biopsies of the second and third portion of the duodenum taken. Retroflexed views revealed no abnormalities.     The scope was then withdrawn from the patient and the procedure completed.  COMPLICATIONS: There were no immediate complications.  ENDOSCOPIC IMPRESSION: Normal EGD -  status post Maloney dilation followed by esophageal and duodenal biopsy  RECOMMENDATIONS: Continue Protonix 40 mg daily?"Follow-up on pathology. See colonoscopy report.  REPEAT EXAM:  eSigned:  R. Roetta SessionsMichael Ashely Goosby, MD Jerrel IvoryFACP [facility] 10/27/2014 12:05 PM    CC:  CPT CODES: ICD CODES:  The ICD and CPT codes recommended by this software are interpretations from the data that the clinical staff has captured with the software.  The verification of the translation of this report to the ICD and CPT codes and modifiers is the sole responsibility of the health care institution and practicing physician where this report was generated.  [employer] will not be held responsible for the validity of the ICD and CPT codes included on this report.  AMA assumes no liability for data contained or not contained herein. CPT is a Publishing rights managerregistered trademark of the [employer].

## 2014-10-27 NOTE — H&P (View-Only) (Signed)
    Primary Care Physician:  MANN, BENJAMIN, PA-C Primary Gastroenterologist:  Dr. Rourk   Chief Complaint  Patient presents with  . Dysphagia  . Gastrophageal Reflux    HPI:   Kristina Lewis is a 49 y.o. female presenting today as a self-referral secondary to severe GERD symptoms and dysphagia.   Parkinson's disease diagnosed Jan 2015. Diagnosed at [facility]. States symptoms originally began in 2013.   Notes reflux, chest discomfort, difficulty swallowing fish oil pills. Used to be unable to swallow large pills now also small pills. When eating bread, it feels like a large clump in throat.   Used to weigh 144, now 105. Lost weight over a year. Globus sensation. 6 months of symptoms of globus sensation, with pill and solid food dysphagia for about 2-3 months.  Could care less about eating. Gets nauseated pretty easily. Nausea after eating several bites. No PPI currently. Had flu like symptoms around Feb. Treated through [facility] for strep. No help with Azithromycin. No improvement with stronger abx. Started running tests, saw LFTs were elevated. Had swallow study at [facility]. LFTs in interim have normalized. Need to obtain labs.   No melena. Notes paper hematochezia. Alternating diarrhea and constipation. Chronic. More constipation predominant. No OTC agents. No prior colonoscopy or upper endoscopy. After eating bread, doubles over in pain several hours later. Hard to narrow down to what is causing symptoms. Some rectal discomfort with defecation.   [facility] completed labs recently to include viral markers per patient.   Past Medical History  Diagnosis Date  . IC (interstitial cystitis)     bladder stimulator 10/2012  . Seasonal allergies   . Bulging disc     CERVICAL  . Anxiety   . Urge incontinence   . PONV (postoperative nausea and vomiting)   . Left leg pain   . Parkinson's disease     Past Surgical History  Procedure Laterality Date  . Lasik      bilateral  . Cesarean  section  12/2000  &  01-26-2003    X2  . Breast enhancement surgery  AGE 25  . Tonsillectomy  AGE 21'S  . Cysto/ urethral dilation/ hod  X2  LAST ONE 12-19-2004  . Interstim implant placement N/A 10/08/2012    Procedure: INTERSTIM IMPLANT FIRST STAGE;  Surgeon: Scott A MacDiarmid, MD;  Location: [facility];  Service: Urology;  Laterality: N/A;  . Interstim implant placement N/A 10/08/2012    Procedure: INTERSTIM IMPLANT SECOND STAGE;  Surgeon: Scott A MacDiarmid, MD;  Location: [facility];  Service: Urology;  Laterality: N/A;    Current Outpatient Prescriptions  Medication Sig Dispense Refill  . ALPRAZolam (XANAX) 0.5 MG tablet Take 0.5 mg by mouth at bedtime as needed.    . ALPRAZolam (XANAX) 1 MG tablet Take 1.5 mg by mouth at bedtime as needed for anxiety.    . baclofen (LIORESAL) 10 MG tablet Take one tablet twice daily. 60 each 3  . Carbidopa-Levodopa (RYTARY PO) Take by mouth.    . clobetasol (TEMOVATE) 0.05 % external solution Apply 1 application topically 2 (two) times daily as needed (for scalp).     . fish oil-omega-3 fatty acids 1000 MG capsule Take 4 g by mouth daily.     . FLUoxetine (PROZAC) 10 MG capsule Take 10 mg by mouth daily.    . ibuprofen (ADVIL,MOTRIN) 200 MG tablet Take 200 mg by mouth every 6 (six) hours as needed for pain.    .   zonisamide (ZONEGRAN) 100 MG capsule Take 400 mg by mouth daily.      No current facility-administered medications for this visit.    Allergies as of 10/14/2014 - Review Complete 10/14/2014  Allergen Reaction Noted  . Adhesive [tape] Itching 10/04/2012  . Monistat [miconazole] Swelling 11/28/2011    Family History  Problem Relation Age of Onset  . Diabetes Mother     Died, 75  . Diabetes Father     Living, 81  . Cancer Father     prostate  . Hypertension Father   . Hypertension Maternal Grandmother   . Heart disease Maternal Grandmother   . Diabetes Paternal Grandfather   . Breast cancer Maternal  Aunt   . Cancer Cousin     MATERNAL  . Colon cancer Neg Hx     History   Social History  . Marital Status: Married    Spouse Name: N/A  . Number of Children: 3  . Years of Education: N/A   Occupational History  . Not on file.   Social History Main Topics  . Smoking status: Never Smoker   . Smokeless tobacco: Never Used  . Alcohol Use: No  . Drug Use: No  . Sexual Activity: Not on file   Other Topics Concern  . 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  She had three healthy children at home.  Husband is preacher.      Review of Systems: Gen: see HPI CV: see HPI Resp: +DOE GI: see HPI GU : +urinary burning MS: left leg stiffness, pain, pain Derm: Denies rash, itching, dry skin Psych: +anxiety Heme: Denies bruising, bleeding, and enlarged lymph nodes.  Physical Exam: BP 101/60 mmHg  Pulse 73  Temp(Src) 97 F (36.1 C)  Ht 5' 3" (1.6 m)  Wt 105 lb 9.6 oz (47.9 kg)  BMI 18.71 kg/m2  LMP 09/25/2014 General:   Alert and oriented. Pleasant and cooperative. Thin.  Head:  Normocephalic and atraumatic. Eyes:  Without icterus, sclera clear and conjunctiva pink.  Ears:  Normal auditory acuity. Nose:  No deformity, discharge,  or lesions. Mouth:  No deformity or lesions, oral mucosa pink.  Lungs:  Clear to auscultation bilaterally. No wheezes, rales, or rhonchi. No distress.  Heart:  S1, S2 present without murmurs appreciated.  Abdomen:  +BS, soft, mild TTP upper abdomen and non-distended. No HSM noted. No guarding or rebound. No masses appreciated.  Rectal:  Deferred  Msk:  Symmetrical without gross deformities. Normal posture. Extremities:  Without edema. Neurologic:  Alert and  oriented x4 Skin:  Intact without significant lesions or rashes. Psych:  Alert and cooperative. Normal mood and affect.  Lab Results  Component Value Date   ALT 58* 09/30/2014   AST 21 09/30/2014   ALKPHOS 173* 09/30/2014   BILITOT 0.6 09/30/2014   Lab Results    Component Value Date   WBC 8.1 09/30/2014   HGB 12.8 09/30/2014   HCT 38.4 09/30/2014   MCV 93.2 09/30/2014   PLT 267 09/30/2014   Lab Results  Component Value Date   CREATININE 0.92 09/30/2014   BUN 8 09/30/2014   NA 135 09/30/2014   K 3.3* 09/30/2014   CL 105 09/30/2014   CO2 23 09/30/2014          

## 2014-10-27 NOTE — Discharge Instructions (Signed)
Colonoscopy Discharge Instructions  Read the instructions outlined below and refer to this sheet in the next few weeks. These discharge instructions provide you with general information on caring for yourself after you leave the hospital. Your doctor may also give you specific instructions. While your treatment has been planned according to the most current medical practices available, unavoidable complications occasionally occur. If you have any problems or questions after discharge, call Dr. Gala Romney at [phone]. ACTIVITY  You may resume your regular activity, but move at a slower pace for the next 24 hours.   Take frequent rest periods for the next 24 hours.   Walking will help get rid of the air and reduce the bloated feeling in your belly (abdomen).   No driving for 24 hours (because of the medicine (anesthesia) used during the test).    Do not sign any important legal documents or operate any machinery for 24 hours (because of the anesthesia used during the test).  NUTRITION  Drink plenty of fluids.   You may resume your normal diet as instructed by your doctor.   Begin with a light meal and progress to your normal diet. Heavy or fried foods are harder to digest and may make you feel sick to your stomach (nauseated).   Avoid alcoholic beverages for 24 hours or as instructed.  MEDICATIONS  You may resume your normal medications unless your doctor tells you otherwise.  WHAT YOU CAN EXPECT TODAY  Some feelings of bloating in the abdomen.   Passage of more gas than usual.   Spotting of blood in your stool or on the toilet paper.  IF YOU HAD POLYPS REMOVED DURING THE COLONOSCOPY:  No aspirin products for 7 days or as instructed.   No alcohol for 7 days or as instructed.   Eat a soft diet for the next 24 hours.  FINDING OUT THE RESULTS OF YOUR TEST Not all test results are available during your visit. If your test results are not back during the visit, make an appointment  with your caregiver to find out the results. Do not assume everything is normal if you have not heard from your caregiver or the medical facility. It is important for you to follow up on all of your test results.  SEEK IMMEDIATE MEDICAL ATTENTION IF:  You have more than a spotting of blood in your stool.   Your belly is swollen (abdominal distention).   You are nauseated or vomiting.   You have a temperature over 101.  You have abdominal pain or discomfort that is severe or gets worse throughout the day. EGD Discharge instructions Please read the instructions outlined below and refer to this sheet in the next few weeks. These discharge instructions provide you with general information on caring for yourself after you leave the hospital. Your doctor may also give you specific instructions. While your treatment has been planned according to the most current medical practices available, unavoidable complications occasionally occur. If you have any problems or questions after discharge, please call your doctor. ACTIVITY You may resume your regular activity but move at a slower pace for the next 24 hours.  Take frequent rest periods for the next 24 hours.  Walking will help expel (get rid of) the air and reduce the bloated feeling in your abdomen.  No driving for 24 hours (because of the anesthesia (medicine) used during the test).  You may shower.  Do not sign any important legal documents or operate any machinery for 24  hours (because of the anesthesia used during the test).  NUTRITION Drink plenty of fluids.  You may resume your normal diet.  Begin with a light meal and progress to your normal diet.  Avoid alcoholic beverages for 24 hours or as instructed by your caregiver.  MEDICATIONS You may resume your normal medications unless your caregiver tells you otherwise.  WHAT YOU CAN EXPECT TODAY You may experience abdominal discomfort such as a feeling of fullness or gas pains.   FOLLOW-UP Your doctor will discuss the results of your test with you.  SEEK IMMEDIATE MEDICAL ATTENTION IF ANY OF THE FOLLOWING OCCUR: Excessive nausea (feeling sick to your stomach) and/or vomiting.  Severe abdominal pain and distention (swelling).  Trouble swallowing.  Temperature over 101 F (37.8 C).  Rectal bleeding or vomiting of blood.    Continue Protonix 40 mg daily  Add Benefiber 2 teaspoons twice daily to regimen  Ten-day course of Anusol suppositories 1 per rectum twice daily  Further recommendations to follow pending review of pathology report

## 2014-10-27 NOTE — Interval H&P Note (Signed)
History and Physical Interval Note:  10/27/2014 10:37 AM  Kristina Lewis  has presented today for surgery, with the diagnosis of dysphagia, hematochezia  The various methods of treatment have been discussed with the patient and family. After consideration of risks, benefits and other options for treatment, the patient has consented to  Procedure(s) with comments: COLONOSCOPY WITH PROPOFOL (N/A) - 900am ESOPHAGOGASTRODUODENOSCOPY (EGD) WITH PROPOFOL (N/A) ESOPHAGEAL DILATION (N/A) as a surgical intervention .  The patient's history has been reviewed, patient examined, no change in status, stable for surgery.  I have reviewed the patient's chart and labs.  Questions were answered to the patient's satisfaction.     [name]  Patient without any change in her symptoms. For EGD with possible esophageal dilation and diagnostic colonoscopy today.The risks, benefits, limitations, imponderables and alternatives regarding both EGD and colonoscopy have been reviewed with the patient. Questions have been answered. Patient and husband agreeable.

## 2014-10-27 NOTE — Anesthesia Postprocedure Evaluation (Signed)
  Anesthesia Post-op Note  Patient: Kristina DanielStarr C Lewis  Procedure(s) Performed: Procedure(s) with comments: COLONOSCOPY WITH PROPOFOL (N/A) - Cecum time in 1128 time out 1137      total time 9 minutes ESOPHAGOGASTRODUODENOSCOPY (EGD) WITH PROPOFOL (N/A) - procedure 1 ESOPHAGEAL DILATION (N/A) - Maloney 52 BIOPSY (N/A) - duodenal, esophageal  Patient Location: PACU  Anesthesia Type:MAC  Level of Consciousness: awake, alert  and oriented  Airway and Oxygen Therapy: Patient Spontanous Breathing  Post-op Pain: none  Post-op Assessment: Post-op Vital signs reviewed, Patient's Cardiovascular Status Stable, Respiratory Function Stable, Patent Airway and No signs of Nausea or vomiting  Post-op Vital Signs: Reviewed and stable  Last Vitals:  Filed Vitals:   10/27/14 1045  BP: 108/69  Pulse:   Temp:   Resp: 16    Complications: No apparent anesthesia complications

## 2014-10-29 ENCOUNTER — Encounter: Payer: BLUE CROSS/BLUE SHIELD | Admitting: Speech Pathology

## 2014-10-30 ENCOUNTER — Telehealth: Payer: Self-pay

## 2014-10-30 ENCOUNTER — Encounter: Payer: Self-pay | Admitting: Internal Medicine

## 2014-10-30 NOTE — Telephone Encounter (Signed)
Per RMR-  Send letter to patient.  Send copy of letter with path to referring provider and PCP. Patient needs office follow-up with the [facility] in 2-4 weeks

## 2014-10-30 NOTE — Telephone Encounter (Signed)
APPOINTMENT MADE AND LETTER SENT °

## 2014-10-30 NOTE — Telephone Encounter (Signed)
Letter mailed to the pt. 

## 2014-11-05 ENCOUNTER — Telehealth: Payer: Self-pay | Admitting: Internal Medicine

## 2014-11-05 ENCOUNTER — Encounter: Payer: BLUE CROSS/BLUE SHIELD | Admitting: Speech Pathology

## 2014-11-05 NOTE — Telephone Encounter (Signed)
Called for a progress report and left message regarding bx's on her voice mail.  She should have a f/u appt w us in the coming weeks.

## 2014-11-09 ENCOUNTER — Other Ambulatory Visit (HOSPITAL_COMMUNITY): Payer: BLUE CROSS/BLUE SHIELD

## 2014-11-12 ENCOUNTER — Encounter: Payer: BLUE CROSS/BLUE SHIELD | Admitting: Speech Pathology

## 2014-12-01 ENCOUNTER — Ambulatory Visit (INDEPENDENT_AMBULATORY_CARE_PROVIDER_SITE_OTHER): Payer: BLUE CROSS/BLUE SHIELD | Admitting: Gastroenterology

## 2014-12-01 ENCOUNTER — Encounter: Payer: Self-pay | Admitting: Gastroenterology

## 2014-12-01 VITALS — BP 95/61 | HR 83 | Temp 97.6°F | Ht 63.0 in | Wt 107.2 lb

## 2014-12-01 DIAGNOSIS — R634 Abnormal weight loss: Secondary | ICD-10-CM

## 2014-12-01 DIAGNOSIS — R1314 Dysphagia, pharyngoesophageal phase: Secondary | ICD-10-CM

## 2014-12-01 MED ORDER — PANTOPRAZOLE SODIUM 40 MG PO TBEC: 40.0000 mg | DELAYED_RELEASE_TABLET | Freq: Every day | ORAL | Status: DC

## 2014-12-01 NOTE — Progress Notes (Signed)
Referring Provider: Ginger Organ Primary Care Physician:  Collene Mares, PA-C  Primary GI: Dr. Gala Romney   Chief Complaint  Patient presents with  . Follow-up    HPI:   Kristina Lewis is a 49 y.o. female presenting today with a history of GERD, dysphagia. Parkinson's disease diagnosed in Jan 2015, followed at [facility]. Seen in April 2016 and set up for an EGD with dilation and colonoscopy. Overall, EGD normal with patent esophagus s/p empiric dilation. Colonoscopy with minimal internal hemorrhoids, otherwise normal. Weight loss over the past year, used to weight 144 but 105 in April. Now 107. History of bump in LFTs March 2016 with Alk Phos 377, AST 99, ALT 487. Viral markers negative. Repeat LFTs recently normalized. Prior evaluation at [facility] by Speech Pathology with assessment as follows: Patient presents with oropharyngeal swallow within gross functional limits in the setting of Parkinsonism. Pt demonstrated adequate airway protection evidenced by no aspiration visualized throughout the study; however, pt was also noted to have mid-distal esophageal retention with intra-esophageal retrograde flow. She may benefit from follow up with a GI specialist for further esophageal workup, especially given reports of globus sensation.   Dysphagia much improved. When eating breads feels like there is something in her throat and has abdominal burning. Has been tested for allergies and was told gluten sensitive. Will do almond milk, tries to stay away from gluten. No abdominal pain. Avoids breads and foods that trigger sensation. Declining BPE at this point. No lower GI complaints.     Past Medical History  Diagnosis Date  . IC (interstitial cystitis)     bladder stimulator 10/2012  . Seasonal allergies   . Bulging disc     CERVICAL  . Anxiety   . Urge incontinence   . PONV (postoperative nausea and vomiting)   . Left leg pain   . Parkinson's disease     Past Surgical History    Procedure Laterality Date  . Lasik      bilateral  . Cesarean section  12/2000  &  01-26-2003    X2  . Breast enhancement surgery  AGE 32  . Tonsillectomy  AGE 80'S  . Cysto/ urethral dilation/ hod  X2  LAST ONE 12-19-2004  . Interstim implant placement N/A 10/08/2012    Procedure: INTERSTIM IMPLANT FIRST STAGE;  Surgeon: Reece Packer, MD;  Location: [facility];  Service: Urology;  Laterality: N/A;  . Interstim implant placement N/A 10/08/2012    Procedure: INTERSTIM IMPLANT SECOND STAGE;  Surgeon: Reece Packer, MD;  Location: [facility];  Service: Urology;  Laterality: N/A;  . Colonoscopy with propofol N/A 10/27/2014    Dr. Gala Romney: Minimal internal heorrhoids;otherwise normal rectum, colon and terminal ileum.   . Esophagogastroduodenoscopy (egd) with propofol N/A 10/27/2014    Dr. Gala Romney: Normal EGD. status post maloney dilation follwed by esophageal and duodenal biopsy. benign esophageal and duodenal biopsies  . Esophageal dilation N/A 10/27/2014    Procedure: ESOPHAGEAL DILATION;  Surgeon: Daneil Dolin, MD;  Location: [facility];  Service: Endoscopy;  Laterality: N/AVenia Minks 75  . Esophageal biopsy N/A 10/27/2014    Procedure: BIOPSY;  Surgeon: Daneil Dolin, MD;  Location: [facility];  Service: Endoscopy;  Laterality: N/A;  duodenal, esophageal    Current Outpatient Prescriptions  Medication Sig Dispense Refill  . ALPRAZolam (XANAX) 0.5 MG tablet Take 0.5 mg by mouth at bedtime as needed.    . ALPRAZolam Duanne Moron)  1 MG tablet Take 1.5 mg by mouth at bedtime as needed for anxiety.    . baclofen (LIORESAL) 10 MG tablet Take one tablet twice daily. 60 each 3  . Carbidopa-Levodopa ER 23.75-95 MG CPCR Take 3 capsules by mouth 3 (three) times daily.    . clobetasol (TEMOVATE) 0.05 % external solution Apply 1 application topically 2 (two) times daily as needed (for scalp).     . fish oil-omega-3 fatty acids 1000 MG capsule Take 4 g by mouth daily.     Marland Kitchen  FLUoxetine (PROZAC) 10 MG capsule Take 10 mg by mouth daily.    . hydrocortisone (PROCTOSOL HC) 2.5 % rectal cream Place 1 application rectally 2 (two) times daily. For 7 days 30 g 1  . ibuprofen (ADVIL,MOTRIN) 200 MG tablet Take 200 mg by mouth every 6 (six) hours as needed for pain.    . pantoprazole (PROTONIX) 40 MG tablet Take 1 tablet (40 mg total) by mouth daily. 30 minutes before breakfast 90 tablet 3  . zonisamide (ZONEGRAN) 100 MG capsule Take 400 mg by mouth daily.      No current facility-administered medications for this visit.    Allergies as of 12/01/2014 - Review Complete 10/27/2014  Allergen Reaction Noted  . Adhesive [tape] Itching 10/04/2012  . Monistat [miconazole] Swelling 11/28/2011    Family History  Problem Relation Age of Onset  . Diabetes Mother     Died, 43  . Diabetes Father     Living, 71  . Cancer Father     prostate  . Hypertension Father   . Hypertension Maternal Grandmother   . Heart disease Maternal Grandmother   . Diabetes Paternal Grandfather   . Breast cancer Maternal Aunt   . Cancer Cousin     MATERNAL  . Colon cancer Neg Hx     History   Social History  . Marital Status: Married    Spouse Name: N/A  . Number of Children: 3  . Years of Education: N/A   Social History Main Topics  . Smoking status: Never Smoker   . Smokeless tobacco: Never Used  . Alcohol Use: No  . Drug Use: No  . Sexual Activity: Not on file   Other Topics Concern  . 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  She had three healthy children at home.  Husband is Environmental education officer.      Review of Systems: As mentioned in HPI.   Physical Exam: BP 95/61 mmHg  Pulse 83  Temp(Src) 97.6 F (36.4 C) (Oral)  Ht 5' 3"  (1.6 m)  Wt 107 lb 3.2 oz (48.626 kg)  BMI 18.99 kg/m2  LMP 11/30/2014 General:   Alert and oriented. No distress noted. Pleasant and cooperative.  Head:  Normocephalic and atraumatic. Eyes:  Conjuctiva clear without scleral icterus. Mouth:   Oral mucosa pink and moist. Good dentition. No lesions. Abdomen:  +BS, soft, non-tender and non-distended. No rebound or guarding. No HSM or masses noted. Msk:  Symmetrical without gross deformities. Normal posture. Extremities:  Without edema. Neurologic:  Alert and  oriented x4;  grossly normal neurologically. Psych:  Alert and cooperative. Normal mood and affect.  Lab Results  Component Value Date   ALT 6 10/27/2014   AST 22 10/27/2014   ALKPHOS 71 10/27/2014   BILITOT 0.6 10/27/2014

## 2014-12-01 NOTE — Patient Instructions (Signed)
Continue taking Protonix once daily. I have refilled this to your pharmacy.   We will see you in 4-6 months! Please call with any changes at all.

## 2014-12-07 ENCOUNTER — Encounter: Payer: Self-pay | Admitting: Gastroenterology

## 2014-12-07 NOTE — Assessment & Plan Note (Signed)
Up several pounds since last visit. Patient to closely monitor. No concerning findings on EGD/colonoscopy. If further weight loss, recommend imaging.

## 2014-12-07 NOTE — Assessment & Plan Note (Signed)
Improved with recent empiric dilation. No concerning findings on EGD. Bread triggers sensation. Offered BPE, but patient would like to hold on this currently. Query motility disorder underlying. History of Parkinson's. Overall doing much better and would like to follow-up on an more extended schedule. Will have her return in approximately 4-6 months, sooner if needed. As of note, bump in LFTs noted in March 2016 now with resolution. Viral markers negative. Monitor for any further abnormalities and proceed with further work-up as appropriate.

## 2014-12-16 NOTE — Progress Notes (Signed)
cc'd to pcp 

## 2015-01-29 ENCOUNTER — Other Ambulatory Visit: Payer: Self-pay | Admitting: Obstetrics & Gynecology

## 2015-02-01 LAB — CYTOLOGY - PAP

## 2015-03-09 ENCOUNTER — Ambulatory Visit
Admission: RE | Admit: 2015-03-09 | Discharge: 2015-03-09 | Disposition: A | Discharge status: Home / Self Care | Payer: BLUE CROSS/BLUE SHIELD | Source: Ambulatory Visit

## 2015-03-09 DIAGNOSIS — Z9882 Breast implant status: Secondary | ICD-10-CM

## 2015-03-09 DIAGNOSIS — Z1231 Encounter for screening mammogram for malignant neoplasm of breast: Secondary | ICD-10-CM

## 2015-04-05 ENCOUNTER — Ambulatory Visit: Payer: BLUE CROSS/BLUE SHIELD | Admitting: Gastroenterology

## 2015-04-28 NOTE — Therapy (Signed)
[facility] [address] [place], [place], [address] Phone: [phone]   Fax:  [phone]  Patient Details  Name: SHAUNDREA CARRIGG MRN: [id number] Date of Birth: 1965/07/23 Referring Provider:  No ref. provider found  Encounter Date: 04/28/2015  SPEECH THERAPY DISCHARGE SUMMARY  Visits from Start of Care: 1  Current functional level related to goals / functional outcomes: Pt only arrived to evaluation and cancelled remaining visits after scheduling.   Remaining deficits: Believed all deficits remain.   Education / Equipment: Loud speech necessary.  Plan: Patient agrees to discharge.  Patient goals were not met. Patient is being discharged due to not returning since the last visit.  ?????      Joshua Tree , MS, CCC-SLP   04/28/2015, 3:00 PM  [facility] [address] [place], [place], [address] Phone: [phone]   Fax:  [phone]

## 2015-05-05 ENCOUNTER — Encounter: Payer: Self-pay | Admitting: Gastroenterology

## 2015-05-05 ENCOUNTER — Telehealth: Payer: Self-pay | Admitting: Gastroenterology

## 2015-05-05 ENCOUNTER — Ambulatory Visit: Payer: BLUE CROSS/BLUE SHIELD | Admitting: Gastroenterology

## 2015-05-05 NOTE — Telephone Encounter (Signed)
PT WAS A NO SHOW AND LETTER SENT  °

## 2015-05-07 ENCOUNTER — Ambulatory Visit (INDEPENDENT_AMBULATORY_CARE_PROVIDER_SITE_OTHER): Payer: BLUE CROSS/BLUE SHIELD | Admitting: Gastroenterology

## 2015-05-07 ENCOUNTER — Encounter: Payer: Self-pay | Admitting: Gastroenterology

## 2015-05-07 VITALS — BP 101/64 | HR 83 | Temp 97.0°F | Ht 64.0 in | Wt 107.2 lb

## 2015-05-07 DIAGNOSIS — K625 Hemorrhage of anus and rectum: Secondary | ICD-10-CM | POA: Diagnosis not present

## 2015-05-07 DIAGNOSIS — K219 Gastro-esophageal reflux disease without esophagitis: Secondary | ICD-10-CM

## 2015-05-07 NOTE — Patient Instructions (Signed)
We have arranged a follow-up with Dr. Jena Gaussourk for hemorrhoid banding.   I will see you in 6 months. Let me know if you want to pursue the special xray of your esophagus in the meantime.  Have a wonderful holiday season, and let me know if you need anything!

## 2015-05-07 NOTE — Progress Notes (Signed)
Referring Provider: Ginger Organ Primary Care Physician:  Collene Mares, PA-C  Primary GI: Dr. Gala Romney   Chief Complaint  Patient presents with  . Follow-up    HPI:   Kristina Lewis is a 49 y.o. female presenting today with a history of GERD, dysphagia. Parkinson's disease diagnosed in Jan 2015, followed at [facility]. Seen in April 2016 and set up for an EGD with dilation and colonoscopy. Overall, EGD normal with patent esophagus s/p empiric dilation. Colonoscopy with minimal internal hemorrhoids, otherwise normal. Weight loss over the past year, used to weight 144 but 105 in April. 107 in May and remaining stable today. History of bump in LFTs March 2016 with Alk Phos 377, AST 99, ALT 487. Viral markers negative. Repeat LFTs recently normalized. Prior evaluation at [facility] by Speech Pathology with assessment as follows: Patient presents with oropharyngeal swallow within gross functional limits in the setting of Parkinsonism. Pt demonstrated adequate airway protection evidenced by no aspiration visualized throughout the study; however, pt was also noted to have mid-distal esophageal retention with intra-esophageal retrograde flow. She may benefit from follow up with a GI specialist for further esophageal workup, especially given reports of globus sensation.   Has declined BPE in past.  States dysphagia better. Feels gurgling in throat sometimes, clears throat and is better. Worse after yelling at football games. Trying to eat gluten free. Intermittent low-volume hematochezia with wiping. Very stressed at home with many different responsibilities as a mom, wife. Not eating the best she could due to stress and busy schedule.     Past Medical History  Diagnosis Date  . IC (interstitial cystitis)     bladder stimulator 10/2012  . Seasonal allergies   . Bulging disc     CERVICAL  . Anxiety   . Urge incontinence   . PONV (postoperative nausea and vomiting)   . Left leg pain   .  Parkinson's disease [facility])     Past Surgical History  Procedure Laterality Date  . Lasik      bilateral  . Cesarean section  12/2000  &  01-26-2003    X2  . Breast enhancement surgery  AGE 64  . Tonsillectomy  AGE 50'S  . Cysto/ urethral dilation/ hod  X2  LAST ONE 12-19-2004  . Interstim implant placement N/A 10/08/2012    Procedure: INTERSTIM IMPLANT FIRST STAGE;  Surgeon: Reece Packer, MD;  Location: [facility];  Service: Urology;  Laterality: N/A;  . Interstim implant placement N/A 10/08/2012    Procedure: INTERSTIM IMPLANT SECOND STAGE;  Surgeon: Reece Packer, MD;  Location: [facility];  Service: Urology;  Laterality: N/A;  . Colonoscopy with propofol N/A 10/27/2014    Dr. Gala Romney: Minimal internal heorrhoids;otherwise normal rectum, colon and terminal ileum.   . Esophagogastroduodenoscopy (egd) with propofol N/A 10/27/2014    Dr. Gala Romney: Normal EGD. status post maloney dilation follwed by esophageal and duodenal biopsy. benign esophageal and duodenal biopsies  . Esophageal dilation N/A 10/27/2014    Procedure: ESOPHAGEAL DILATION;  Surgeon: Daneil Dolin, MD;  Location: [facility];  Service: Endoscopy;  Laterality: N/AVenia Minks 84  . Esophageal biopsy N/A 10/27/2014    Procedure: BIOPSY;  Surgeon: Daneil Dolin, MD;  Location: [facility];  Service: Endoscopy;  Laterality: N/A;  duodenal, esophageal    Current Outpatient Prescriptions  Medication Sig Dispense Refill  . ALPRAZolam (XANAX) 1 MG tablet Take 1.5 mg by mouth at bedtime as  needed for anxiety.    . baclofen (LIORESAL) 10 MG tablet Take one tablet twice daily. 60 each 3  . Carbidopa-Levodopa ER 23.75-95 MG CPCR Take 3 capsules by mouth 3 (three) times daily.    . clobetasol (TEMOVATE) 0.05 % external solution Apply 1 application topically 2 (two) times daily as needed (for scalp).     . fish oil-omega-3 fatty acids 1000 MG capsule Take 4 g by mouth daily.     Marland Kitchen FLUoxetine (PROZAC) 10 MG capsule  Take 10 mg by mouth daily.    . hydrocortisone (PROCTOSOL HC) 2.5 % rectal cream Place 1 application rectally 2 (two) times daily. For 7 days 30 g 1  . ibuprofen (ADVIL,MOTRIN) 200 MG tablet Take 200 mg by mouth every 6 (six) hours as needed for pain.    . pantoprazole (PROTONIX) 40 MG tablet Take 1 tablet (40 mg total) by mouth daily. 30 minutes before breakfast 90 tablet 3  . zonisamide (ZONEGRAN) 100 MG capsule Take 400 mg by mouth daily.     Marland Kitchen ALPRAZolam (XANAX) 0.5 MG tablet Take 0.5 mg by mouth at bedtime as needed.     No current facility-administered medications for this visit.    Allergies as of 05/07/2015 - Review Complete 05/07/2015  Allergen Reaction Noted  . Adhesive [tape] Itching 10/04/2012  . Monistat [miconazole] Swelling 11/28/2011    Family History  Problem Relation Age of Onset  . Diabetes Mother     Died, 62  . Diabetes Father     Living, 81  . Cancer Father     prostate  . Hypertension Father   . Hypertension Maternal Grandmother   . Heart disease Maternal Grandmother   . Diabetes Paternal Grandfather   . Breast cancer Maternal Aunt   . Cancer Cousin     MATERNAL  . Colon cancer Neg Hx     Social History   Social History  . Marital Status: Married    Spouse Name: N/A  . Number of Children: 3  . Years of Education: N/A   Social History Main Topics  . Smoking status: Never Smoker   . Smokeless tobacco: Never Used  . Alcohol Use: No  . Drug Use: No  . Sexual Activity: Not Asked   Other Topics Concern  . 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  She had three healthy children at home.  Husband is Environmental education officer.      Review of Systems: As mentioned in HPI.   Physical Exam: BP 101/64 mmHg  Pulse 83  Temp(Src) 97 F (36.1 C) (Oral)  Ht 5' 4"  (1.626 m)  Wt 107 lb 3.2 oz (48.626 kg)  BMI 18.39 kg/m2 General:   Alert and oriented. No distress noted. Pleasant and cooperative.  Head:  Normocephalic and atraumatic. Eyes:  Conjuctiva clear  without scleral icterus. Mouth:  Oral mucosa pink and moist. Good dentition. No lesions. Abdomen:  +BS, soft, non-tender and non-distended. No rebound or guarding. No HSM or masses noted. Msk:  Symmetrical without gross deformities. Normal posture. Extremities:  Without edema. Neurologic:  Alert and  oriented x4;  grossly normal neurologically. Psych:  Alert and cooperative. Normal mood and affect.

## 2015-05-13 NOTE — Progress Notes (Signed)
Ginger:  Can we see if Mrs. Kristina Lewis can come tomorrow, 11/11, to have hemorrhoid banding with RMR?

## 2015-05-13 NOTE — Assessment & Plan Note (Signed)
Known internal hemorrhoids on recent colonoscopy. Will arrange for outpatient hemorrhoid banding with Dr. Jena Gaussourk in the near future.

## 2015-05-13 NOTE — Progress Notes (Signed)
i have tried to call every number but there is no way to leave a message

## 2015-05-13 NOTE — Assessment & Plan Note (Signed)
Continue Protonix daily. Still with occasional dysphagia. Query motility disorder. Declining BPE but will consider.  Weight remains stable. Return in 6 months.

## 2015-05-13 NOTE — Progress Notes (Signed)
cc'ed to pcp °

## 2015-05-18 ENCOUNTER — Encounter: Payer: Self-pay | Admitting: Internal Medicine

## 2015-05-18 ENCOUNTER — Ambulatory Visit (INDEPENDENT_AMBULATORY_CARE_PROVIDER_SITE_OTHER): Payer: BLUE CROSS/BLUE SHIELD | Admitting: Internal Medicine

## 2015-05-18 ENCOUNTER — Telehealth: Payer: Self-pay

## 2015-05-18 VITALS — BP 106/63 | HR 75 | Temp 98.3°F | Ht 64.0 in | Wt 110.4 lb

## 2015-05-18 DIAGNOSIS — K641 Second degree hemorrhoids: Secondary | ICD-10-CM | POA: Diagnosis not present

## 2015-05-18 DIAGNOSIS — K648 Other hemorrhoids: Secondary | ICD-10-CM

## 2015-05-18 NOTE — Telephone Encounter (Signed)
Pt called and said she has been having some cramps since her hemorrhoid banding this AM. She said it seemed similar to menstrual cramps. She took Ibuprofen but did not help much. She said it is not very bad, she just wanted to know if it is normal after the banding. She said it is NOT a pinching sensation.  She thinks it could be related to the stimulator that she has for her bladder and she is just concerned. I told her that she should be feeling better in a little while but I would let Dr. Jena Gaussourk know for any recommendation. I told her to definitely call right away if the cramps worsen and I will let her know what Dr. Jena Gaussourk recommends.

## 2015-05-18 NOTE — Telephone Encounter (Signed)
I doubt cramps directly related to band placement. If no pinching or pain in the area of band placement I think we are okay

## 2015-05-18 NOTE — Telephone Encounter (Signed)
Pt is aware and will let us know if she has problems.

## 2015-05-18 NOTE — Progress Notes (Signed)
[facility] banding procedure note:  The patient presents with symptomatic grade 2 hemorrhoids, unresponsive to maximal medical therapy; requesting rubber band ligation of her hemorrhoidal disease. All risks, benefits, and alternative forms of therapy were described and informed consent was obtained.  Patient intermittently constipated without fiber or laxative supplement. If she travels she tends not to go to the bathroom until she comes home. Typically does have a bowel movement daily, however.  In the left lateral decubitus position, a DRE was performed - negative. Anoscopy performed revealing a prominent left lateral hemorrhoid column only.  The decision was made to band the left lateral internal hemorrhoid; A pea-sized amount of nitroglycerin 25% ointment applied to the anorectum.  the [facility] System was used to perform band ligation without complication. Digital anorectal examination was then performed to assure proper positioning of the band;  Been found to be in excellent position. No pinching or pain. The patient was discharged home without pain or other issues. Dietary and behavioral recommendations were given along with follow-up instructions. The patient will return in 3 weeks for followup and possible additional banding as required.  No complications were encountered and the patient tolerated the procedure well.

## 2015-05-18 NOTE — Patient Instructions (Signed)
Avoid straining.  Benefiber 1 heaping tablespoon twice daily  Limit toilet time to 5 minutes  Call with any interim problems  Schedule followup appointment in 3 weeks from now

## 2015-06-08 ENCOUNTER — Ambulatory Visit: Payer: BLUE CROSS/BLUE SHIELD | Admitting: Internal Medicine

## 2015-06-15 ENCOUNTER — Encounter: Payer: BLUE CROSS/BLUE SHIELD | Admitting: Internal Medicine

## 2015-06-18 ENCOUNTER — Encounter: Payer: Self-pay | Admitting: Internal Medicine

## 2015-06-18 ENCOUNTER — Ambulatory Visit (INDEPENDENT_AMBULATORY_CARE_PROVIDER_SITE_OTHER): Payer: BLUE CROSS/BLUE SHIELD | Admitting: Internal Medicine

## 2015-06-18 VITALS — BP 103/69 | HR 72 | Temp 97.5°F | Ht 64.0 in | Wt 109.4 lb

## 2015-06-18 DIAGNOSIS — K648 Other hemorrhoids: Secondary | ICD-10-CM | POA: Diagnosis not present

## 2015-06-18 NOTE — Progress Notes (Signed)
[facility] banding procedure note:  The patient presents with symptomatic grade 2 hemorrhoids; unresponsive to maximal medical therapy;  Status post banding of the left lateral hemorrhoid recently. States symptoms have improved. However she felt that banding set off her bladder stimulator she had some cramping but didn't call back it passed after a bowel movement at home.  She requests rubber band ligation of her hemorrhoidal disease. All risks, benefits, and alternative forms of therapy were described and informed consent was obtained.  In the left lateral decubitus position, a digital rectal exam revealed no abnormalities. The decision was made to band the right anterior internal hemorrhoid;  the [facility] was used to perform band ligation without complication. Digital anorectal examination was then performed to assure proper positioning of the band;  And felt to be in excellent position however, patient developed some pelvic spasm after placement. Repeat a digital rectal exam and applied some 1% Xylocaine cream to the area.; Been confirmed to be, if anything, a little loose. Did not feel that manipulation needed The patient was discharged home without pain or other issues. Dietary and behavioral recommendations were given and The patient will return in 4 weeks for followup and possible additional banding as required.  No complications were encountered and the patient tolerated the procedure well.

## 2015-06-18 NOTE — Patient Instructions (Signed)
Avoid straining.  Benefiber 1 tablespoon twice daily  Limit toilet time to 5 minutes  Call with any interim problems  Schedule followup appointment in 4 weeks from now   

## 2015-07-20 ENCOUNTER — Encounter: Payer: BLUE CROSS/BLUE SHIELD | Admitting: Internal Medicine

## 2015-10-27 DIAGNOSIS — M79642 Pain in left hand: Secondary | ICD-10-CM | POA: Diagnosis not present

## 2015-10-27 DIAGNOSIS — M4812 Ankylosing hyperostosis [Forestier], cervical region: Secondary | ICD-10-CM | POA: Diagnosis not present

## 2015-11-03 DIAGNOSIS — F411 Generalized anxiety disorder: Secondary | ICD-10-CM | POA: Diagnosis not present

## 2015-11-03 DIAGNOSIS — F331 Major depressive disorder, recurrent, moderate: Secondary | ICD-10-CM | POA: Diagnosis not present

## 2015-11-04 ENCOUNTER — Ambulatory Visit: Payer: BLUE CROSS/BLUE SHIELD | Admitting: Gastroenterology

## 2015-11-11 DIAGNOSIS — F331 Major depressive disorder, recurrent, moderate: Secondary | ICD-10-CM | POA: Diagnosis not present

## 2015-11-22 ENCOUNTER — Other Ambulatory Visit: Payer: Self-pay | Admitting: Gastroenterology

## 2015-11-22 DIAGNOSIS — N760 Acute vaginitis: Secondary | ICD-10-CM | POA: Diagnosis not present

## 2015-12-03 DIAGNOSIS — M79642 Pain in left hand: Secondary | ICD-10-CM | POA: Diagnosis not present

## 2015-12-03 DIAGNOSIS — M1812 Unilateral primary osteoarthritis of first carpometacarpal joint, left hand: Secondary | ICD-10-CM | POA: Diagnosis not present

## 2016-01-03 DIAGNOSIS — G20 Parkinson's disease: Secondary | ICD-10-CM | POA: Diagnosis not present

## 2016-01-03 DIAGNOSIS — G249 Dystonia, unspecified: Secondary | ICD-10-CM | POA: Diagnosis not present

## 2016-02-09 ENCOUNTER — Encounter: Payer: Self-pay | Admitting: Gastroenterology

## 2016-02-09 ENCOUNTER — Ambulatory Visit (INDEPENDENT_AMBULATORY_CARE_PROVIDER_SITE_OTHER): Payer: BLUE CROSS/BLUE SHIELD | Admitting: Gastroenterology

## 2016-02-09 VITALS — BP 101/64 | HR 83 | Temp 97.3°F | Ht 64.0 in | Wt 121.6 lb

## 2016-02-09 DIAGNOSIS — K219 Gastro-esophageal reflux disease without esophagitis: Secondary | ICD-10-CM | POA: Diagnosis not present

## 2016-02-09 MED ORDER — FLUCONAZOLE 150 MG PO TABS: 150.0000 mg | ORAL_TABLET | Freq: Every day | ORAL | 0 refills | Status: DC

## 2016-02-09 NOTE — Patient Instructions (Signed)
I have given you samples of Dexilant to try instead of Protonix. Take this once each morning. Let me know how this works for you.  I sent Diflucan to the pharmacy.    We will see you in 3 months!

## 2016-02-09 NOTE — Progress Notes (Signed)
cc'ed to pcp °

## 2016-02-09 NOTE — Assessment & Plan Note (Signed)
Protonix chronically, now with flares of GERD intermittently and mainly exacerbated by dairy. Has failed Prilosec historically as well. Will trial Dexilant samples and avoid dairy for now. No alarm symptoms and EGD fairly recent on file. Patient to call if she would like this sent in to the pharmacy. Will have her return in 3 months.  As of note, she reports an uncomplicated yeast infection. Diflucan sent to pharmacy.

## 2016-02-09 NOTE — Progress Notes (Signed)
Referring Provider: Samuella BruinMann, Benjamin L, PA-C Primary Care Physician:  Lenise HeraldMANN, BENJAMIN, PA-C  Primary GI: Dr. Jena Gaussourk   Chief Complaint  Patient presents with  . Follow-up    problems with GERD-dairy products    HPI:   Kristina Lewis is a 50 y.o. female presenting today with a history of GERD, dysphagia. Parkinson's disease diagnosed in Jan 2015, [facility]. Last EGD 2016 with patent esophagus s/p empiric dilation. [facility]ology with assessment as follows: Patient presents with oropharyngeal swallow within gross functional limits in the setting of Parkinsonism. Pt demonstrated adequate airway protection evidenced by no aspiration visualized throughout the study; however, pt was also noted to have mid-distal esophageal retention with intra-esophageal retrograde flow. She may benefit from follow up with a GI specialist for further esophageal workup, especially given reports of globus sensation. Has declined BPE in the past. Has undergone hemorrhoid banding by Dr. Jena Gaussourk of left lateral hemorrhoid (05/2015), right anterior internal hemorrhoid (06/2015).   Dairy flares up reflux and makes it feel like phlegm in her throat. Any smoothie or ice cream flares up symptoms. Sometimes coughing with eating. Has been taking Protonix for about a year. Has taken Prilosec before. No abdominal pain. No constipation. No issues with hemorrhoids or rectal bleeding. Has a yeast infection, requesting Diflucan.   Past Medical History:  Diagnosis Date  . Anxiety   . Bulging disc    CERVICAL  . Hemorrhoids   . IC (interstitial cystitis)    bladder stimulator 10/2012  . Left leg pain   . Parkinson's disease (HCC)   . PONV (postoperative nausea and vomiting)   . Seasonal allergies   . Urge incontinence     Past Surgical History:  Procedure Laterality Date  . BIOPSY N/A 10/27/2014   Procedure: BIOPSY;  Surgeon: Corbin Adeobert M Rourk, MD;  Location: [facility];  Service: Endoscopy;   Laterality: N/A;  duodenal, esophageal  . BREAST ENHANCEMENT SURGERY  AGE 50  . CESAREAN SECTION  12/2000  &  01-26-2003   X2  . COLONOSCOPY WITH PROPOFOL N/A 10/27/2014   Dr. Jena Gaussourk: Minimal internal heorrhoids;otherwise normal rectum, colon and terminal ileum.   . CYSTO/ URETHRAL DILATION/ HOD  X2  LAST ONE 12-19-2004  . ESOPHAGEAL DILATION N/A 10/27/2014   Procedure: ESOPHAGEAL DILATION;  Surgeon: Corbin Adeobert M Rourk, MD;  Location: [facility];  Service: Endoscopy;  Laterality: N/AElease Hashimoto;  Maloney 53  . ESOPHAGOGASTRODUODENOSCOPY (EGD) WITH PROPOFOL N/A 10/27/2014   Dr. Jena Gaussourk: Normal EGD. status post maloney dilation follwed by esophageal and duodenal biopsy. benign esophageal and duodenal biopsies  . HEMORRHOID BANDING  2016   Dr.Rourk  . INTERSTIM IMPLANT PLACEMENT N/A 10/08/2012   Procedure: Leane PlattINTERSTIM IMPLANT FIRST STAGE;  Surgeon: Martina SinnerScott A MacDiarmid, MD;  [facility];  Service: Urology;  Laterality: N/A;  . INTERSTIM IMPLANT PLACEMENT N/A 10/08/2012   Procedure: Leane PlattINTERSTIM IMPLANT SECOND STAGE;  Surgeon: Martina SinnerScott A MacDiarmid, MD;  [facility] ;  Service: Urology;  Laterality: N/A;  . LASIK     bilateral  . TONSILLECTOMY  AGE 34'S    Current Outpatient Prescriptions  Medication Sig Dispense Refill  . ALPRAZolam (XANAX) 1 MG tablet Take 2 mg by mouth at bedtime.     . baclofen (LIORESAL) 10 MG tablet Take one tablet twice daily. (Patient taking differently: 10 mg every 8 (eight) hours as needed. Take one tablet twice daily.) 60 each 3  . Carbidopa-Levodopa ER 23.75-95 MG CPCR  Take 1 capsule by mouth 4 (four) times daily.     Marland Kitchen escitalopram (LEXAPRO) 10 MG tablet Take 10 mg by mouth daily.    Marland Kitchen ibuprofen (ADVIL,MOTRIN) 200 MG tablet Take 200 mg by mouth every 6 (six) hours as needed for pain.    . pantoprazole (PROTONIX) 40 MG tablet TAKE ONE TABLET BY MOUTH DAILY 30 MINUTES BEFORE BREAKFAST 90 tablet 3  . zonisamide (ZONEGRAN) 100 MG capsule Take 400 mg by  mouth daily.     . fluconazole (DIFLUCAN) 150 MG tablet Take 1 tablet (150 mg total) by mouth daily. Repeat X 1 if necessary in 72 hours 2 tablet 0   No current facility-administered medications for this visit.     Allergies as of 02/09/2016 - Review Complete 02/09/2016  Allergen Reaction Noted  . Adhesive [tape] Itching 10/04/2012  . Monistat [miconazole] Swelling 11/28/2011    Family History  Problem Relation Age of Onset  . Diabetes Mother     Died, 47  . Diabetes Father     Living, 49  . Cancer Father     prostate  . Hypertension Father   . Hypertension Maternal Grandmother   . Heart disease Maternal Grandmother   . Diabetes Paternal Grandfather   . Breast cancer Maternal Aunt   . Cancer Cousin     MATERNAL  . Colon cancer Neg Hx     Social History   Social History  . Marital status: Married    Spouse name: N/A  . Number of children: 3  . Years of education: N/A   Social History Main Topics  . Smoking status: Never Smoker  . Smokeless tobacco: Never Used  . Alcohol use No  . Drug use: No  . Sexual activity: Not on file   Other Topics Concern  . 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  She had three healthy children at home.  Husband is Programmer, multimedia.      Review of Systems: As mentioned in HPI   Physical Exam: BP 101/64   Pulse 83   Temp 97.3 F (36.3 C) (Oral)   Ht  (1.626 m)   Wt 121 lb 9.6 oz (55.2 kg)   LMP 02/01/2016 (Approximate)   BMI 20.87 kg/m  General:   Alert and oriented. No distress noted. Pleasant and cooperative.  Head:  Normocephalic and atraumatic. Eyes:  Conjuctiva clear without scleral icterus. Heart:  S1, S2 present without murmurs Abdomen:  +BS, soft, non-tender and non-distended. No rebound or guarding. No HSM or masses noted. Msk:  Symmetrical without gross deformities. Normal posture. Extremities:  Without edema. Neurologic:  Alert and  oriented x4;  grossly normal neurologically. Psych:  Alert and  cooperative. Normal mood and affect.

## 2016-02-16 DIAGNOSIS — Z6821 Body mass index (BMI) 21.0-21.9, adult: Secondary | ICD-10-CM | POA: Diagnosis not present

## 2016-02-16 DIAGNOSIS — Z01419 Encounter for gynecological examination (general) (routine) without abnormal findings: Secondary | ICD-10-CM | POA: Diagnosis not present

## 2016-02-21 ENCOUNTER — Telehealth: Payer: Self-pay | Admitting: Internal Medicine

## 2016-02-21 NOTE — Telephone Encounter (Signed)
Routing to the refill box. 

## 2016-02-21 NOTE — Telephone Encounter (Signed)
Pt tried her samples of dexilant and she said that she is doing fine with them and if we would send a prescription into [facility] in [place].

## 2016-02-22 MED ORDER — DEXLANSOPRAZOLE 60 MG PO CPDR: 60.0000 mg | DELAYED_RELEASE_CAPSULE | Freq: Every day | ORAL | 3 refills | Status: DC

## 2016-02-22 NOTE — Addendum Note (Signed)
Addended by: Tiffany KocherLEWIS, Yon Schiffman S on: 02/22/2016 09:55 AM   Modules accepted: Orders

## 2016-02-22 NOTE — Telephone Encounter (Signed)
RX done. 

## 2016-04-06 DIAGNOSIS — Z1389 Encounter for screening for other disorder: Secondary | ICD-10-CM | POA: Diagnosis not present

## 2016-04-06 DIAGNOSIS — Z681 Body mass index (BMI) 19 or less, adult: Secondary | ICD-10-CM | POA: Diagnosis not present

## 2016-04-06 DIAGNOSIS — R0602 Shortness of breath: Secondary | ICD-10-CM | POA: Diagnosis not present

## 2016-04-06 DIAGNOSIS — F419 Anxiety disorder, unspecified: Secondary | ICD-10-CM | POA: Diagnosis not present

## 2016-04-10 DIAGNOSIS — G20 Parkinson's disease: Secondary | ICD-10-CM | POA: Diagnosis not present

## 2016-04-10 DIAGNOSIS — R0602 Shortness of breath: Secondary | ICD-10-CM | POA: Diagnosis not present

## 2016-04-10 DIAGNOSIS — F411 Generalized anxiety disorder: Secondary | ICD-10-CM | POA: Diagnosis not present

## 2016-04-10 DIAGNOSIS — Z23 Encounter for immunization: Secondary | ICD-10-CM | POA: Diagnosis not present

## 2016-04-10 DIAGNOSIS — G249 Dystonia, unspecified: Secondary | ICD-10-CM | POA: Diagnosis not present

## 2016-04-12 DIAGNOSIS — G43909 Migraine, unspecified, not intractable, without status migrainosus: Secondary | ICD-10-CM | POA: Diagnosis not present

## 2016-04-17 ENCOUNTER — Telehealth: Payer: Self-pay | Admitting: Internal Medicine

## 2016-04-17 ENCOUNTER — Other Ambulatory Visit: Payer: Self-pay

## 2016-04-17 DIAGNOSIS — R1310 Dysphagia, unspecified: Secondary | ICD-10-CM

## 2016-04-17 NOTE — Telephone Encounter (Signed)
Spoke with the pt- she said for about 3 weeks she has had increased burping, SOB, heaviness and discomfort in her chest. Some nausea, no vomiting, no fever, it gets worse when she eats. She went to her pcp and they did a EKG and it was normal and her lungs sounded normal. She couldn't afford the dexilant and has been taking protonix. I offered her a copay card and she said the protonix was working ok but it has seemed to stop working and she doesn't think restarting dexilant will help. Her pcp told her if it continues to go to the ED, pt said she couldn't afford to go to the ED. She would like to talk to [facility].

## 2016-04-17 NOTE — Telephone Encounter (Signed)
Burping a lot, feels at times she has something stuck in her throat. Feels a tightness in her chest at times. Feels a globus sensation. Present for about 3 weeks. Occasional odynophagia. Only mild improvement with Dexilant but not significant. Too pricey.   Will continue Protonix once each morning, start Zantac in the evening.   [facility] clinical pool: please arrange BPE this week.

## 2016-04-17 NOTE — Telephone Encounter (Signed)
PT CALLED AND STATED THAT SHE WENT TO GET DEXILANT FILLED AND IT WAS TOO MUCH SO SHE WENT BACK TO THE PROTONIX AND IT IS NOT WORKING  [phone]

## 2016-04-18 NOTE — Telephone Encounter (Signed)
Called and informed pt Kristina Lewis scheduled for 04/21/16 at 9:00 am at [facility]. Arrive at 8:45 am. NPO 3 hours prior to test. Pt asked if there would be a co-pay. Gave pt phone number for Billing.

## 2016-04-21 ENCOUNTER — Ambulatory Visit (HOSPITAL_COMMUNITY)
Admission: RE | Admit: 2016-04-21 | Discharge: 2016-04-21 | Disposition: A | Discharge status: Home / Self Care | Payer: BLUE CROSS/BLUE SHIELD | Source: Ambulatory Visit | Attending: Gastroenterology | Admitting: Gastroenterology

## 2016-04-21 DIAGNOSIS — R1310 Dysphagia, unspecified: Secondary | ICD-10-CM

## 2016-04-25 NOTE — Progress Notes (Signed)
Please let patient know her BPE was normal. How is she doing with adding Zantac at night?

## 2016-04-27 NOTE — Progress Notes (Signed)
Ok, let's bump up her appt sooner. Misty StanleyStacey, can you have patient come in sooner to see me than 12/1? Sometime in next 2-3 weeks.

## 2016-04-27 NOTE — Progress Notes (Signed)
MOVED APPT SOONER AND CALLED PATIENT

## 2016-05-10 DIAGNOSIS — Z1329 Encounter for screening for other suspected endocrine disorder: Secondary | ICD-10-CM | POA: Diagnosis not present

## 2016-05-10 DIAGNOSIS — Z13228 Encounter for screening for other metabolic disorders: Secondary | ICD-10-CM | POA: Diagnosis not present

## 2016-05-10 DIAGNOSIS — Z1321 Encounter for screening for nutritional disorder: Secondary | ICD-10-CM | POA: Diagnosis not present

## 2016-05-10 DIAGNOSIS — Z1231 Encounter for screening mammogram for malignant neoplasm of breast: Secondary | ICD-10-CM | POA: Diagnosis not present

## 2016-05-10 DIAGNOSIS — Z13220 Encounter for screening for lipoid disorders: Secondary | ICD-10-CM | POA: Diagnosis not present

## 2016-05-17 ENCOUNTER — Encounter: Payer: Self-pay | Admitting: Gastroenterology

## 2016-05-17 ENCOUNTER — Telehealth: Payer: Self-pay

## 2016-05-17 ENCOUNTER — Ambulatory Visit (INDEPENDENT_AMBULATORY_CARE_PROVIDER_SITE_OTHER): Payer: BLUE CROSS/BLUE SHIELD | Admitting: Gastroenterology

## 2016-05-17 VITALS — BP 106/64 | HR 92 | Temp 97.6°F | Ht 64.0 in | Wt 116.4 lb

## 2016-05-17 DIAGNOSIS — R109 Unspecified abdominal pain: Secondary | ICD-10-CM | POA: Insufficient documentation

## 2016-05-17 DIAGNOSIS — R1010 Upper abdominal pain, unspecified: Secondary | ICD-10-CM | POA: Diagnosis not present

## 2016-05-17 DIAGNOSIS — R0989 Other specified symptoms and signs involving the circulatory and respiratory systems: Secondary | ICD-10-CM | POA: Insufficient documentation

## 2016-05-17 DIAGNOSIS — F458 Other somatoform disorders: Secondary | ICD-10-CM

## 2016-05-17 DIAGNOSIS — K5900 Constipation, unspecified: Secondary | ICD-10-CM | POA: Diagnosis not present

## 2016-05-17 NOTE — Patient Instructions (Signed)
I have given you Nexium samples to try. Take one each morning 30 minutes before breakfast. Take the Zantac in the evenings as needed.  The tests I would recommend would be an ultrasound of the abdomen with possible need for a HIDA scan. Just let me know if you want to pursue this in the future.  We have referred you to ENT again.   You may take Miralax as needed each evening.  Have a great Christmas!

## 2016-05-17 NOTE — Progress Notes (Signed)
Referring Provider: Samuella BruinMann, Benjamin L, PA-C Primary Care Physician:  Lenise HeraldMANN, BENJAMIN, PA-C  Primary GI: Dr. Jena Gaussourk   Chief Complaint  Patient presents with  . Follow-up    HPI:   Kristina Lewis is a 50 y.o. female presenting today with a history of  GERD, dysphagia. Parkinson's disease diagnosed in Jan 2015, [facility]. Last EGD 2016 with patent esophagus s/p empiric dilation. [facility] with assessment as follows: Patient presents with oropharyngeal swallow within gross functional limits in the setting of Parkinsonism. Pt demonstrated adequate airway protection evidenced by no aspiration visualized throughout the study; however, pt was also noted to have mid-distal esophageal retention with intra-esophageal retrograde flow. She may benefit from follow up with a GI specialist for further esophageal workup, especially given reports of globus sensation. Has declined BPE in the past. Has undergone hemorrhoid banding by Dr. Jena Gaussourk of left lateral hemorrhoid (05/2015), right anterior internal hemorrhoid (06/2015). Recent BPE normal esophagram. BPE was performed due to belching, globus sensation, odynophagia. Protonix once each morning, Zantac in evening prn.   States symptoms similar to episodes prior to EGD, and interestingly she noted improvement after that EGD with empiric dilation. She states right now she can't proceed with an EGD due to insurance reasons. Burping worse. Pain noted in upper abdomen, right below xiphoid process. Pain is worse after eating. Ate spaghetti one time and felt like she was going to have a heart attack. Will burp after drinking water. Feels like something is stuck in her throat. Lower abdominal discomfort as well. Feels like she is having to strain to swallow. Zantac in the morning and Zantac at night. Ran out of the Protonix, but it did not help much anyway. Can tell if she doesn't take it. Left side of neck hurts worse than right.     Chronic constipation: has a BM every day but has had to strain more in last 3-4 weeks.   Past Medical History:  Diagnosis Date  . Anxiety   . Bulging disc    CERVICAL  . Hemorrhoids   . IC (interstitial cystitis)    bladder stimulator 10/2012  . Left leg pain   . Parkinson's disease (HCC)   . PONV (postoperative nausea and vomiting)   . Seasonal allergies   . Urge incontinence     Past Surgical History:  Procedure Laterality Date  . BIOPSY N/A 10/27/2014   Procedure: BIOPSY;  Surgeon: Corbin Adeobert M Rourk, MD;  Location: [facility];  Service: Endoscopy;  Laterality: N/A;  duodenal, esophageal  . BREAST ENHANCEMENT SURGERY  AGE 50  . CESAREAN SECTION  12/2000  &  01-26-2003   X2  . COLONOSCOPY WITH PROPOFOL N/A 10/27/2014   Dr. Jena Gaussourk: Minimal internal heorrhoids;otherwise normal rectum, colon and terminal ileum.   . CYSTO/ URETHRAL DILATION/ HOD  X2  LAST ONE 12-19-2004  . ESOPHAGEAL DILATION N/A 10/27/2014   Procedure: ESOPHAGEAL DILATION;  Surgeon: Corbin Adeobert M Rourk, MD;  Location: [facility];  Service: Endoscopy;  Laterality: N/AElease Hashimoto;  Maloney 53  . ESOPHAGOGASTRODUODENOSCOPY (EGD) WITH PROPOFOL N/A 10/27/2014   Dr. Jena Gaussourk: Normal EGD. status post maloney dilation follwed by esophageal and duodenal biopsy. benign esophageal and duodenal biopsies  . HEMORRHOID BANDING  2016   Dr.Rourk  . INTERSTIM IMPLANT PLACEMENT N/A 10/08/2012   Procedure: Leane PlattINTERSTIM IMPLANT FIRST STAGE;  Surgeon: Martina SinnerScott A MacDiarmid, MD;  [facility];  Service: Urology;  Laterality: N/A;  . INTERSTIM IMPLANT PLACEMENT N/A  10/08/2012   Procedure: Leane PlattINTERSTIM IMPLANT SECOND STAGE;  Surgeon: Martina SinnerScott A MacDiarmid, MD;  [facility];  Service: Urology;  Laterality: N/A;  . LASIK     bilateral  . TONSILLECTOMY  AGE 44'S    Current Outpatient Prescriptions  Medication Sig Dispense Refill  . ALPRAZolam (XANAX) 1 MG tablet Take 2 mg by mouth at bedtime.     . baclofen (LIORESAL) 10 MG tablet  Take one tablet twice daily. (Patient taking differently: 10 mg every 8 (eight) hours as needed. Take one tablet twice daily.) 60 each 3  . Carbidopa-Levodopa ER 23.75-95 MG CPCR Take 1 capsule by mouth 4 (four) times daily.     Marland Kitchen. dexlansoprazole (DEXILANT) 60 MG capsule Take 1 capsule (60 mg total) by mouth daily. 90 capsule 3  . escitalopram (LEXAPRO) 10 MG tablet Take 10 mg by mouth daily.    Marland Kitchen. ibuprofen (ADVIL,MOTRIN) 200 MG tablet Take 200 mg by mouth every 6 (six) hours as needed for pain.    . ranitidine (ZANTAC) 150 MG capsule Take 150 mg by mouth 2 (two) times daily.    Marland Kitchen. zonisamide (ZONEGRAN) 100 MG capsule Take 400 mg by mouth daily.      No current facility-administered medications for this visit.     Allergies as of 05/17/2016 - Review Complete 05/17/2016  Allergen Reaction Noted  . Adhesive [tape] Itching 10/04/2012  . Monistat [miconazole] Swelling 11/28/2011    Family History  Problem Relation Age of Onset  . Diabetes Mother     Died, 9375  . Diabetes Father     Living, 8381  . Cancer Father     prostate  . Hypertension Father   . Hypertension Maternal Grandmother   . Heart disease Maternal Grandmother   . Diabetes Paternal Grandfather   . Breast cancer Maternal Aunt   . Cancer Cousin     MATERNAL  . Colon cancer Neg Hx     Social History   Social History  . Marital status: Married    Spouse name: N/A  . Number of children: 3  . Years of education: N/A   Social History Main Topics  . Smoking status: Never Smoker  . Smokeless tobacco: Never Used  . Alcohol use No  . Drug use: No  . Sexual activity: Not Asked   Other Topics Concern  . 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  She had three healthy children at home.  Husband is Programmer, multimediapreacher.      Review of Systems: As noted in HPI   Physical Exam: BP 106/64   Pulse 92   Temp 97.6 F (36.4 C) (Oral)   Ht 5\' 4"  (1.626 m)   Wt 116 lb 6.4 oz (52.8 kg)   LMP 04/07/2016   BMI 19.98 kg/m   General:   Alert and oriented. No distress noted. Pleasant and cooperative.  Head:  Normocephalic and atraumatic. Eyes:  Conjuctiva clear without scleral icterus. Neck: no adenopathy or thyromegaly  Mouth:  Oral mucosa pink and moist. Good dentition. No lesions. Heart:  S1, S2 present without murmurs, rubs, or gallops. Regular rate and rhythm. Abdomen:  +BS, soft, non-tender and non-distended. No rebound or guarding. No HSM or masses noted. Msk:  Symmetrical without gross deformities. Normal posture. Extremities:  Without edema. Neurologic:  Alert and  oriented x4;  grossly normal neurologically. Psych:  Alert and cooperative. Normal mood and affect.

## 2016-05-17 NOTE — Telephone Encounter (Signed)
While pt was in office for visit, she said she would [place] in [place] and schedule an appt because she was already established there.

## 2016-05-22 NOTE — Assessment & Plan Note (Signed)
History of Parkinson's, EGD in April 2016 with empiric dilation. Notes improvement of symptoms with this after dilation. Unable to proceed with EGD now due to financial reasons. BPE normal. Has not noted improvement with Protonix. Would recommend EGD at some point once able, as she did note improvement after empiric dilation. For now, trial Nexium once each day. Samples provided. Use Zantac only prn in the evening to avoid tachyphylaxis. Referral to ENT to rule out any other etiology to persistent symptoms. Query if history of Parkinson's disease is playing a role.

## 2016-05-22 NOTE — Progress Notes (Signed)
CC'ED TO PCP 

## 2016-05-22 NOTE — Assessment & Plan Note (Signed)
Trial Miralax as needed. Colonoscopy up-to-date. Wants to avoid prescriptive agent at this point.

## 2016-05-22 NOTE — Assessment & Plan Note (Signed)
Appears to be related to certain foods; aggravated by eating, persistent belching noted. Gallbladder remains in situ. Patient would like to hold off on biliary work-up until she checks with her insurance. Trial of Nexium for now.

## 2016-06-02 ENCOUNTER — Ambulatory Visit: Payer: BLUE CROSS/BLUE SHIELD | Admitting: Gastroenterology

## 2016-06-08 DIAGNOSIS — Z1389 Encounter for screening for other disorder: Secondary | ICD-10-CM | POA: Diagnosis not present

## 2016-06-08 DIAGNOSIS — J209 Acute bronchitis, unspecified: Secondary | ICD-10-CM | POA: Diagnosis not present

## 2016-06-08 DIAGNOSIS — Z681 Body mass index (BMI) 19 or less, adult: Secondary | ICD-10-CM | POA: Diagnosis not present

## 2016-07-05 DIAGNOSIS — M1812 Unilateral primary osteoarthritis of first carpometacarpal joint, left hand: Secondary | ICD-10-CM | POA: Diagnosis not present

## 2016-09-04 DIAGNOSIS — G20 Parkinson's disease: Secondary | ICD-10-CM | POA: Diagnosis not present

## 2016-09-04 DIAGNOSIS — G249 Dystonia, unspecified: Secondary | ICD-10-CM | POA: Diagnosis not present

## 2016-11-02 DIAGNOSIS — G20 Parkinson's disease: Secondary | ICD-10-CM | POA: Diagnosis not present

## 2016-11-02 DIAGNOSIS — F331 Major depressive disorder, recurrent, moderate: Secondary | ICD-10-CM | POA: Diagnosis not present

## 2016-11-02 DIAGNOSIS — G43709 Chronic migraine without aura, not intractable, without status migrainosus: Secondary | ICD-10-CM | POA: Diagnosis not present

## 2016-11-14 DIAGNOSIS — R0602 Shortness of breath: Secondary | ICD-10-CM | POA: Diagnosis not present

## 2016-11-14 DIAGNOSIS — Z6821 Body mass index (BMI) 21.0-21.9, adult: Secondary | ICD-10-CM | POA: Diagnosis not present

## 2016-11-14 DIAGNOSIS — J069 Acute upper respiratory infection, unspecified: Secondary | ICD-10-CM | POA: Diagnosis not present

## 2016-11-14 DIAGNOSIS — J209 Acute bronchitis, unspecified: Secondary | ICD-10-CM | POA: Diagnosis not present

## 2016-11-14 DIAGNOSIS — K219 Gastro-esophageal reflux disease without esophagitis: Secondary | ICD-10-CM | POA: Diagnosis not present

## 2016-11-14 DIAGNOSIS — J343 Hypertrophy of nasal turbinates: Secondary | ICD-10-CM | POA: Diagnosis not present

## 2016-11-14 DIAGNOSIS — G20 Parkinson's disease: Secondary | ICD-10-CM | POA: Diagnosis not present

## 2016-11-14 DIAGNOSIS — Z1389 Encounter for screening for other disorder: Secondary | ICD-10-CM | POA: Diagnosis not present

## 2017-01-01 DIAGNOSIS — G20 Parkinson's disease: Secondary | ICD-10-CM | POA: Diagnosis not present

## 2017-01-01 DIAGNOSIS — F411 Generalized anxiety disorder: Secondary | ICD-10-CM | POA: Diagnosis not present

## 2017-02-22 ENCOUNTER — Telehealth: Payer: Self-pay | Admitting: Cardiovascular Disease

## 2017-02-22 NOTE — Telephone Encounter (Signed)
Received records from [facility] for appointment on 03/08/17 with Dr Duke Salvia.  Records put with Dr Leonides Sake schedule for 03/08/17. lp

## 2017-03-08 ENCOUNTER — Ambulatory Visit (INDEPENDENT_AMBULATORY_CARE_PROVIDER_SITE_OTHER): Payer: BLUE CROSS/BLUE SHIELD | Admitting: Cardiovascular Disease

## 2017-03-08 ENCOUNTER — Encounter: Payer: Self-pay | Admitting: Cardiovascular Disease

## 2017-03-08 ENCOUNTER — Telehealth: Payer: Self-pay | Admitting: Cardiovascular Disease

## 2017-03-08 VITALS — BP 111/77 | HR 74 | Ht 64.0 in | Wt 127.2 lb

## 2017-03-08 DIAGNOSIS — R002 Palpitations: Secondary | ICD-10-CM | POA: Diagnosis not present

## 2017-03-08 DIAGNOSIS — R0789 Other chest pain: Secondary | ICD-10-CM

## 2017-03-08 NOTE — Telephone Encounter (Signed)
New Message   pt verbalized that she is calling for rn   To speak about the Holter monitor and today's appt  regarding future care

## 2017-03-08 NOTE — Telephone Encounter (Signed)
Spoke with patient and scheduled her holter

## 2017-03-08 NOTE — Progress Notes (Signed)
Cardiology Office Note   Date:  03/15/2017   ID:  Kristina Lewis, DOB [birth year], MRN [id number]  PCP:  Shawnie Dapper, PA-C  Cardiologist:   Chilton Si, MD   Chief Complaint  Patient presents with  . New Patient (Initial Visit)      History of Present Illness: Kristina Lewis is a 50 y.o. female with Parkinson's disease, who presents for evaluation of palpitations and chest heaviness.  Kristina Lewis saw Dr. Assunta Found 10/2016 and reported palpitations.  She was referred to cardiology for further evaluation.  Her palpitations have been intermittent for the last year.  She first had palpitations 16 years ago while pregnant.  However they subsided and started again recenetly.  The episodes recur off and on and last up to an hour at a time.   they occur most commonly when laying down.  She also reports increased shortness of breath with exertion and sharp chest pressure.  She reports drinking two coffees in the am and sometimes another in the afternoon.  She has no more or less palpitations based on her caffeine intake.  She participates in spinning class two days per week.  She gets more palpitations when trying to exercise.  She denies lower extremity edema, orthopnea or PND.  She reports mild lightheadedness and dizziness but now syncope.     Past Medical History:  Diagnosis Date  . Anxiety   . Atypical chest pain 03/15/2017  . Bulging disc    CERVICAL  . Hemorrhoids   . IC (interstitial cystitis)    bladder stimulator 10/2012  . Left leg pain   . Palpitations 03/15/2017  . Parkinson's disease (HCC)   . PONV (postoperative nausea and vomiting)   . Seasonal allergies   . Urge incontinence     Past Surgical History:  Procedure Laterality Date  . BIOPSY N/A 10/27/2014   Procedure: BIOPSY;  Surgeon: Corbin Ade, MD;  Location: [facility];  Service: Endoscopy;  Laterality: N/A;  duodenal, esophageal  . BREAST ENHANCEMENT SURGERY  AGE 58  . CESAREAN SECTION  12/2000  &   01-26-2003   X2  . COLONOSCOPY WITH PROPOFOL N/A 10/27/2014   Dr. Jena Gauss: Minimal internal heorrhoids;otherwise normal rectum, colon and terminal ileum.   . CYSTO/ URETHRAL DILATION/ HOD  X2  LAST ONE 12-19-2004  . ESOPHAGEAL DILATION N/A 10/27/2014   Procedure: ESOPHAGEAL DILATION;  Surgeon: Corbin Ade, MD;  Location: [facility];  Service: Endoscopy;  Laterality: N/AElease Lewis 53  . ESOPHAGOGASTRODUODENOSCOPY (EGD) WITH PROPOFOL N/A 10/27/2014   Dr. Jena Gauss: Normal EGD. status post maloney dilation follwed by esophageal and duodenal biopsy. benign esophageal and duodenal biopsies  . HEMORRHOID BANDING  2016   Dr.Rourk  . INTERSTIM IMPLANT PLACEMENT N/A 10/08/2012   Procedure: [name] IMPLANT FIRST STAGE;  Surgeon: Martina Sinner, MD;  [facility]: [facility];  Service: Urology;  Laterality: N/A;  . INTERSTIM IMPLANT PLACEMENT N/A 10/08/2012   Procedure: [name] IMPLANT SECOND STAGE;  Surgeon: Martina Sinner, MD;  Location: [facility] [place];  Service: Urology;  Laterality: N/A;  . LASIK     bilateral  . TONSILLECTOMY  AGE 36'S     Current Outpatient Prescriptions  Medication Sig Dispense Refill  . ALPRAZolam (XANAX) 1 MG tablet Take 2 mg by mouth at bedtime.     . baclofen (LIORESAL) 10 MG tablet Take one tablet twice daily. (Patient taking differently: 10 mg every 8 (eight) hours as needed. Take  one tablet twice daily.) 60 each 3  . Carbidopa-Levodopa ER 23.75-95 MG CPCR Take 1 capsule by mouth 4 (four) times daily.     Marland Kitchen dexlansoprazole (DEXILANT) 60 MG capsule Take 1 capsule (60 mg total) by mouth daily. 90 capsule 3  . escitalopram (LEXAPRO) 10 MG tablet Take 10 mg by mouth daily.    . Esomeprazole Magnesium (NEXIUM PO) Take by mouth daily.    Marland Kitchen FLUoxetine (PROZAC) 20 MG tablet Take by mouth.    Marland Kitchen ibuprofen (ADVIL,MOTRIN) 200 MG tablet Take 200 mg by mouth every 6 (six) hours as needed for pain.    . ranitidine (ZANTAC) 150 MG capsule Take 150 mg by mouth 2  (two) times daily.    Marland Kitchen zonisamide (ZONEGRAN) 100 MG capsule Take 400 mg by mouth daily.      No current facility-administered medications for this visit.     Allergies:   Adhesive [tape] and Monistat [miconazole]    Social History:  The patient  reports that she has never smoked. She has never used smokeless tobacco. She reports that she does not drink alcohol or use drugs.   Family History:  The patient's family history includes Breast cancer in her maternal aunt; Cancer in her cousin and father; Diabetes in her father, mother, and paternal grandfather; Heart disease in her maternal grandmother; Hypertension in her father and maternal grandmother; Stroke in her paternal grandmother.    ROS:  Please see the history of present illness.   Otherwise, review of systems are positive for L shoulder pain.   All other systems are reviewed and negative.    PHYSICAL EXAM: VS:  BP 111/77   Pulse 74   Ht 5\' 4"  (1.626 m)   Wt 57.7 kg (127 lb 3.2 oz)   BMI 21.83 kg/m  , BMI Body mass index is 21.83 kg/m. GENERAL:  Well appearing HEENT:  Pupils equal round and reactive, fundi not visualized, oral mucosa unremarkable NECK:  No jugular venous distention, waveform within normal limits, carotid upstroke brisk and symmetric, no bruits, no thyromegaly LYMPHATICS:  No cervical adenopathy LUNGS:  Clear to auscultation bilaterally HEART:  RRR.  PMI not displaced or sustained,S1 and S2 within normal limits, no S3, no S4, no clicks, no rubs, no murmurs ABD:  Flat, positive bowel sounds normal in frequency in pitch, no bruits, no rebound, no guarding, no midline pulsatile mass, no hepatomegaly, no splenomegaly EXT:  2 plus pulses throughout, no edema, no cyanosis no clubbing SKIN:  No rashes no nodules NEURO:  Cranial nerves II through XII grossly intact, motor grossly intact throughout PSYCH:  Cognitively intact, oriented to person place and time    EKG:  EKG is ordered today. The ekg ordered today  demonstrates sinus rhythm, sinus arrhythmia.  Rate 74 bpm.     Recent Labs: 03/08/2017: ALT 5; BUN 18; Creatinine, Ser 1.02; Hemoglobin 13.7; Magnesium 2.0; Platelets 237; Potassium 3.8; Sodium 140; TSH 1.610     Lipid Panel    Component Value Date/Time   CHOL 211 (H) 08/17/2011 1020   TRIG 52 08/17/2011 1020   HDL 106 08/17/2011 1020   CHOLHDL 2.0 08/17/2011 1020   VLDL 10 08/17/2011 1020   LDLCALC 95 08/17/2011 1020      Wt Readings from Last 3 Encounters:  03/08/17 57.7 kg (127 lb 3.2 oz)  05/17/16 52.8 kg (116 lb 6.4 oz)  02/09/16 55.2 kg (121 lb 9.6 oz)      ASSESSMENT AND PLAN:  # Palpitations: Likely  PACs or PVCs.  We will check a 48 hour Holter, CBC, CMP, magnesium and thyroid studies.   # Atypical chest pain: Ms. Solari has chest pain that is exertional though atypical.  We will get an ETT.    Current medicines are reviewed at length with the patient today.  The patient does not have concerns regarding medicines.  The following changes have been made:  no change  Labs/ tests ordered today include:   Orders Placed This Encounter  Procedures  . CBC with Differential/Platelet  . T4, free  . TSH  . Comprehensive metabolic panel  . Magnesium  . Holter monitor - 24 hour  . Exercise Tolerance Test  . EKG 12-Lead     Disposition:   FU with Bettie Swavely C. Duke Salvia, MD, [facility]  In 4-6 weeks.    This note was written with the assistance of speech recognition software.  Please excuse any transcriptional errors.  Signed, Sorah Falkenstein C. Duke Salvia, MD, [facility]  03/15/2017 9:11 PM    [facility]

## 2017-03-08 NOTE — Patient Instructions (Addendum)
Medication Instructions:  Your physician recommends that you continue on your current medications as directed. Please refer to the Current Medication list given to you today.  Labwork: CBC/TSH/FT4/CMET/MAGNESIUM TODAY   Testing/Procedures: Your physician has recommended that you wear a holter monitor. Holter monitors are medical devices that record the heart's electrical activity. Doctors most often use these monitors to diagnose arrhythmias. Arrhythmias are problems with the speed or rhythm of the heartbeat. The monitor is a small, portable device. You can wear one while you do your normal daily activities. This is usually used to diagnose what is causing palpitations/syncope (passing out). 24 hour  Your physician has requested that you have an exercise tolerance test. For further information please visit [email or web address]. Please also follow instruction sheet, as given. [facility] AT [address]  Follow-Up: Your physician recommends that you schedule a follow-up appointment in: 4-6 WEEKS   If you need a refill on your cardiac medications before your next appointment, please call your pharmacy.

## 2017-03-08 NOTE — Telephone Encounter (Signed)
Returned the call to the patient. She stated that she thought she may need to wear a Holter monitor but it was not listed in her avs summary. Will route to the provider's nurse for her recommendation.

## 2017-03-09 LAB — COMPREHENSIVE METABOLIC PANEL
ALBUMIN: 4.7 g/dL (ref 3.5–5.5)
ALT: 5 IU/L (ref 0–32)
AST: 19 IU/L (ref 0–40)
Albumin/Globulin Ratio: 2 (ref 1.2–2.2)
Alkaline Phosphatase: 71 IU/L (ref 39–117)
BUN/Creatinine Ratio: 18 (ref 9–23)
BUN: 18 mg/dL (ref 6–24)
Bilirubin Total: <0.2 mg/dL (ref 0.0–1.2)
CO2: 23 mmol/L (ref 20–29)
Calcium: 9.9 mg/dL (ref 8.7–10.2)
Chloride: 102 mmol/L (ref 96–106)
Creatinine, Ser: 1.02 mg/dL — ABNORMAL HIGH (ref 0.57–1.00)
GFR calc non Af Amer: 65 mL/min/{1.73_m2} (ref >59)
GFR, EST AFRICAN AMERICAN: 75 mL/min/{1.73_m2} (ref >59)
GLOBULIN, TOTAL: 2.4 g/dL (ref 1.5–4.5)
GLUCOSE: 97 mg/dL (ref 65–99)
POTASSIUM: 3.8 mmol/L (ref 3.5–5.2)
SODIUM: 140 mmol/L (ref 134–144)
TOTAL PROTEIN: 7.1 g/dL (ref 6.0–8.5)

## 2017-03-09 LAB — CBC WITH DIFFERENTIAL/PLATELET
BASOS: 0 %
Basophils Absolute: 0 10*3/uL (ref 0.0–0.2)
EOS (ABSOLUTE): 0 10*3/uL (ref 0.0–0.4)
EOS: 0 %
HEMATOCRIT: 42.6 % (ref 34.0–46.6)
Hemoglobin: 13.7 g/dL (ref 11.1–15.9)
IMMATURE GRANULOCYTES: 0 %
Immature Grans (Abs): 0 10*3/uL (ref 0.0–0.1)
LYMPHS ABS: 1.2 10*3/uL (ref 0.7–3.1)
Lymphs: 25 %
MCH: 30.4 pg (ref 26.6–33.0)
MCHC: 32.2 g/dL (ref 31.5–35.7)
MCV: 95 fL (ref 79–97)
MONOS ABS: 0.4 10*3/uL (ref 0.1–0.9)
Monocytes: 8 %
NEUTROS PCT: 67 %
Neutrophils Absolute: 3.2 10*3/uL (ref 1.4–7.0)
PLATELETS: 237 10*3/uL (ref 150–379)
RBC: 4.5 x10E6/uL (ref 3.77–5.28)
RDW: 14.2 % (ref 12.3–15.4)
WBC: 4.8 10*3/uL (ref 3.4–10.8)

## 2017-03-09 LAB — TSH: TSH: 1.61 u[IU]/mL (ref 0.450–4.500)

## 2017-03-09 LAB — T4, FREE: Free T4: 1.18 ng/dL (ref 0.82–1.77)

## 2017-03-09 LAB — MAGNESIUM: MAGNESIUM: 2 mg/dL (ref 1.6–2.3)

## 2017-03-13 DIAGNOSIS — R002 Palpitations: Secondary | ICD-10-CM | POA: Diagnosis not present

## 2017-03-14 ENCOUNTER — Telehealth: Payer: Self-pay | Admitting: Cardiovascular Disease

## 2017-03-14 NOTE — Telephone Encounter (Signed)
-----   Message from Chilton Siiffany [place], MD sent at 03/12/2017  3:12 PM EDT ----- Normal kidney function, liver function, thyroid, and electrolytes.

## 2017-03-14 NOTE — Telephone Encounter (Signed)
New Message ° ° pt verbalized that she is returning call for rn °

## 2017-03-14 NOTE — Telephone Encounter (Signed)
Advised patient of lab results  

## 2017-03-15 ENCOUNTER — Encounter (HOSPITAL_COMMUNITY): Payer: BLUE CROSS/BLUE SHIELD

## 2017-03-15 ENCOUNTER — Encounter: Payer: Self-pay | Admitting: Cardiovascular Disease

## 2017-03-15 DIAGNOSIS — R002 Palpitations: Secondary | ICD-10-CM

## 2017-03-15 DIAGNOSIS — R0789 Other chest pain: Secondary | ICD-10-CM

## 2017-03-15 HISTORY — DX: Other chest pain: R07.89

## 2017-03-15 HISTORY — DX: Palpitations: R00.2

## 2017-03-20 ENCOUNTER — Ambulatory Visit (INDEPENDENT_AMBULATORY_CARE_PROVIDER_SITE_OTHER): Payer: BLUE CROSS/BLUE SHIELD

## 2017-03-20 DIAGNOSIS — R002 Palpitations: Secondary | ICD-10-CM | POA: Diagnosis not present

## 2017-03-20 LAB — EXERCISE TOLERANCE TEST
CHL CUP MPHR: 170 {beats}/min
CHL CUP STRESS STAGE 2 SPEED: 0 mph
CHL CUP STRESS STAGE 3 HR: 120 {beats}/min
CHL CUP STRESS STAGE 4 GRADE: 12 %
CHL CUP STRESS STAGE 5 GRADE: 14 %
CHL CUP STRESS STAGE 5 HR: 148 {beats}/min
CHL CUP STRESS STAGE 7 SPEED: 0 mph
CHL RATE OF PERCEIVED EXERTION: 17
CSEPED: 8 min
CSEPEDS: 0 s
CSEPHR: 87 %
CSEPPMHR: 87 %
Estimated workload: 10.1 METS
Peak HR: 148 {beats}/min
Rest HR: 75 {beats}/min
Stage 1 DBP: 77 mmHg
Stage 1 Grade: 0 %
Stage 1 HR: 96 {beats}/min
Stage 1 SBP: 120 mmHg
Stage 1 Speed: 0 mph
Stage 2 Grade: 0 %
Stage 2 HR: 96 {beats}/min
Stage 3 DBP: 63 mmHg
Stage 3 Grade: 10 %
Stage 3 SBP: 111 mmHg
Stage 3 Speed: 1.7 mph
Stage 4 DBP: 68 mmHg
Stage 4 HR: 130 {beats}/min
Stage 4 SBP: 132 mmHg
Stage 4 Speed: 2.5 mph
Stage 5 Speed: 3.4 mph
Stage 6 DBP: 66 mmHg
Stage 6 Grade: 0 %
Stage 6 HR: 125 {beats}/min
Stage 6 SBP: 129 mmHg
Stage 6 Speed: 0 mph
Stage 7 DBP: 70 mmHg
Stage 7 Grade: 0 %
Stage 7 HR: 99 {beats}/min
Stage 7 SBP: 136 mmHg

## 2017-03-21 ENCOUNTER — Telehealth: Payer: Self-pay | Admitting: Cardiovascular Disease

## 2017-03-21 ENCOUNTER — Ambulatory Visit (INDEPENDENT_AMBULATORY_CARE_PROVIDER_SITE_OTHER): Payer: BLUE CROSS/BLUE SHIELD

## 2017-03-21 DIAGNOSIS — R002 Palpitations: Secondary | ICD-10-CM

## 2017-03-21 NOTE — Telephone Encounter (Signed)
New message ° ° ° °Pt is returning call to Melinda °

## 2017-03-21 NOTE — Telephone Encounter (Signed)
Reviewed ETT results with patient, low risk study

## 2017-03-27 ENCOUNTER — Telehealth: Payer: Self-pay | Admitting: Cardiovascular Disease

## 2017-03-27 NOTE — Telephone Encounter (Signed)
Called back. Made patient aware results still pending review. Looks like this will need Dr. Leonides Sake interpretation. Sent to Dr. Duke Salvia. Pt verbalized understanding and thanks.  Confirmed upcoming appt with patient for f/u of monitor and stress test.

## 2017-03-27 NOTE — Telephone Encounter (Signed)
Results have now been reported.

## 2017-03-27 NOTE — Telephone Encounter (Signed)
New message     Pt would like the results from the holter monitor please.

## 2017-03-28 DIAGNOSIS — G20 Parkinson's disease: Secondary | ICD-10-CM | POA: Diagnosis not present

## 2017-03-28 NOTE — Telephone Encounter (Signed)
Patient advised of appointment on 04/04/17 with Dr. Duke Salvia at 1:20 pm. Patient verbalized understanding.

## 2017-03-28 NOTE — Telephone Encounter (Signed)
Follow up     Pt wants to know what the next step is from the monitor test?    Pt is to go to [facility] for testing for brain stimulation surgery?  She wants to know if this is going to delay her having surgery , needs to know before 12p today

## 2017-03-28 NOTE — Telephone Encounter (Signed)
Lm2cb-Pt has appt w/[facility] 04/04/2017 1:20 PM to discuss results

## 2017-03-28 NOTE — Telephone Encounter (Signed)
Returning Kristina Lewis's call

## 2017-04-04 ENCOUNTER — Ambulatory Visit (INDEPENDENT_AMBULATORY_CARE_PROVIDER_SITE_OTHER): Payer: BLUE CROSS/BLUE SHIELD | Admitting: Cardiovascular Disease

## 2017-04-04 ENCOUNTER — Encounter: Payer: Self-pay | Admitting: Cardiovascular Disease

## 2017-04-04 VITALS — BP 119/76 | HR 84 | Ht 64.0 in | Wt 128.0 lb

## 2017-04-04 DIAGNOSIS — R0789 Other chest pain: Secondary | ICD-10-CM | POA: Diagnosis not present

## 2017-04-04 DIAGNOSIS — I493 Ventricular premature depolarization: Secondary | ICD-10-CM

## 2017-04-04 DIAGNOSIS — R002 Palpitations: Secondary | ICD-10-CM | POA: Diagnosis not present

## 2017-04-04 DIAGNOSIS — Z13220 Encounter for screening for lipoid disorders: Secondary | ICD-10-CM | POA: Diagnosis not present

## 2017-04-04 HISTORY — DX: Ventricular premature depolarization: I49.3

## 2017-04-04 NOTE — Progress Notes (Signed)
Cardiology Office Note   Date:  04/04/2017   ID:  Kristina Lewis, DOB Jan 10, 1966, MRN [id number]  PCP:  Shawnie Dapper, PA-C  Cardiologist:   Chilton Si, MD   Chief Complaint  Patient presents with  . Follow-up    4-6 weeks       History of Present Illness: Kristina Lewis is a 51 y.o. female with PVCs and Parkinson's disease though who presents for follow up.  She was initially seen 03/2017 for evaluation of palpitations and chest heaviness.  Kristina Lewis saw Dr. Assunta Found 10/2016 and reported palpitations.  Her palpitations have been intermittent for the last year.  She was referred for a 24 hour Holter that showed occasional PVCs but no arrhythmias.  She continues to have palpitations multiple times per day that occur randomly.  She continues with exercise and has no chest pain with exertion.  She hasn't noted any lower extremity edema, orthpnea or PND.  She is considering having a deep brain stimulator implanted at [facility].  Kristina Lewis also reported exertional chest pain and was referred for an ETT that was negative for ischemia.  She achieved 10.1 METs on a Bruc protocol.    Past Medical History:  Diagnosis Date  . Anxiety   . Atypical chest pain 03/15/2017  . Bulging disc    CERVICAL  . Hemorrhoids   . IC (interstitial cystitis)    bladder stimulator 10/2012  . Left leg pain   . Palpitations 03/15/2017  . Parkinson's disease (HCC)   . PONV (postoperative nausea and vomiting)   . PVC (premature ventricular contraction) 04/04/2017  . Seasonal allergies   . Urge incontinence     Past Surgical History:  Procedure Laterality Date  . BIOPSY N/A 10/27/2014   Procedure: BIOPSY;  Surgeon: Corbin Ade, MD;  Location: [facility];  Service: Endoscopy;  Laterality: N/A;  duodenal, esophageal  . BREAST ENHANCEMENT SURGERY  AGE 20  . CESAREAN SECTION  12/2000  &  01-26-2003   X2  . COLONOSCOPY WITH PROPOFOL N/A 10/27/2014   Dr. Jena Gauss: Minimal internal heorrhoids;otherwise normal  rectum, colon and terminal ileum.   . CYSTO/ URETHRAL DILATION/ HOD  X2  LAST ONE 12-19-2004  . ESOPHAGEAL DILATION N/A 10/27/2014   Procedure: ESOPHAGEAL DILATION;  Surgeon: Corbin Ade, MD;  Location: [facility];  Service: Endoscopy;  Laterality: N/AElease Lewis 53  . ESOPHAGOGASTRODUODENOSCOPY (EGD) WITH PROPOFOL N/A 10/27/2014   Dr. Jena Gauss: Normal EGD. status post maloney dilation follwed by esophageal and duodenal biopsy. benign esophageal and duodenal biopsies  . HEMORRHOID BANDING  2016   Dr.Rourk  . INTERSTIM IMPLANT PLACEMENT N/A 10/08/2012   Procedure: [name] IMPLANT FIRST STAGE;  Surgeon: Martina Sinner, MD;  Location[facility];  Service: Urology;  Laterality: N/A;  . INTERSTIM IMPLANT PLACEMENT N/A 10/08/2012   Procedure: [name] IMPLANT SECOND STAGE;  Surgeon: Martina Sinner, MD;  Location: [facility];  Service: Urology;  Laterality: N/A;  . LASIK     bilateral  . TONSILLECTOMY  AGE 7'S     Current Outpatient Prescriptions  Medication Sig Dispense Refill  . ALPRAZolam (XANAX) 1 MG tablet Take by mouth at bedtime. 1 1/2 mg    . baclofen (LIORESAL) 10 MG tablet Take one tablet twice daily. (Patient taking differently: 10 mg every 8 (eight) hours as needed. Take one tablet twice daily.) 60 each 3  . Carbidopa-Levodopa ER 23.75-95 MG CPCR Take 1 capsule by mouth  4 (four) times daily.     Marland Kitchen dexlansoprazole (DEXILANT) 60 MG capsule Take 1 capsule (60 mg total) by mouth daily. 90 capsule 3  . escitalopram (LEXAPRO) 10 MG tablet Take 10 mg by mouth daily.    . Esomeprazole Magnesium (NEXIUM PO) Take by mouth daily.    Marland Kitchen FLUoxetine (PROZAC) 20 MG tablet Take by mouth 2 (two) times daily.     Marland Kitchen ibuprofen (ADVIL,MOTRIN) 200 MG tablet Take 200 mg by mouth every 6 (six) hours as needed for pain.    . ranitidine (ZANTAC) 150 MG capsule Take 150 mg by mouth 2 (two) times daily.    Marland Kitchen zonisamide (ZONEGRAN) 100 MG capsule Take 400 mg by mouth daily.       No current facility-administered medications for this visit.     Allergies:   Adhesive [tape] and Monistat [miconazole]    Social History:  The patient  reports that she has never smoked. She has never used smokeless tobacco. She reports that she does not drink alcohol or use drugs.   Family History:  The patient's family history includes Atrial fibrillation in her father; Breast cancer in her maternal aunt; CAD in her father; Cancer in her cousin and father; Diabetes in her father, mother, and paternal grandfather; Heart disease in her maternal grandmother; Hypertension in her father and maternal grandmother; Stroke in her paternal grandmother.    ROS:  Please see the history of present illness.   Otherwise, review of systems are positive for L shoulder pain.   All other systems are reviewed and negative.    PHYSICAL EXAM: VS:  BP 119/76   Pulse 84   Ht 5\' 4"  (1.626 m)   Wt 58.1 kg (128 lb)   BMI 21.97 kg/m  , BMI Body mass index is 21.97 kg/m. GENERAL:  Well appearing HEENT: Pupils equal round and reactive, fundi not visualized, oral mucosa unremarkable NECK:  No jugular venous distention, waveform within normal limits, carotid upstroke brisk and symmetric, no bruits, no thyromegaly LUNGS:  Clear to auscultation bilaterally HEART:  RRR.  PMI not displaced or sustained,S1 and S2 within normal limits, no S3, no S4, no clicks, no rubs, no murmurs ABD:  Flat, positive bowel sounds normal in frequency in pitch, no bruits, no rebound, no guarding, no midline pulsatile mass, no hepatomegaly, no splenomegaly EXT:  2 plus pulses throughout, no edema, no cyanosis no clubbing SKIN:  No rashes no nodules NEURO:  Cranial nerves II through XII grossly intact, motor grossly intact throughout PSYCH:  Cognitively intact, oriented to person place and time   EKG:  EKG is not ordered today. The ekg ordered 03/08/17 demonstrates sinus rhythm, sinus arrhythmia.  Rate 74 bpm.    24 Hour Holter  Monitor 03/21/17:  Quality: Fair.  Baseline artifact. Predominant rhythm: sinus rhythm. Average heart rate: 85 bpm Max heart rate: 134 bpm Min heart rate: 57 bpm  Occasional PVCs.  No arrhythmias noted.   ETT 03/20/17:  Blood pressure demonstrated a normal response to exercise.  ST segment depression of 0.5 mm was noted during stress in the II, III, aVF and V6 leads, beginning at 5 minutes of stress, and returning to baseline after less than 1 minute of recovery.   Normal ECG stress test. <1 mm ST segment depression during exercise does not meet criteria for ischemia diagnosis.  Recent Labs: 03/08/2017: ALT 5; BUN 18; Creatinine, Ser 1.02; Hemoglobin 13.7; Magnesium 2.0; Platelets 237; Potassium 3.8; Sodium 140; TSH 1.610  Lipid Panel    Component Value Date/Time   CHOL 211 (H) 08/17/2011 1020   TRIG 52 08/17/2011 1020   HDL 106 08/17/2011 1020   CHOLHDL 2.0 08/17/2011 1020   VLDL 10 08/17/2011 1020   LDLCALC 95 08/17/2011 1020      Wt Readings from Last 3 Encounters:  04/04/17 58.1 kg (128 lb)  03/08/17 57.7 kg (127 lb 3.2 oz)  05/17/16 52.8 kg (116 lb 6.4 oz)      ASSESSMENT AND PLAN:  # PVCs: Two PVCs noted on her Holter.  This is likely contributing to her palpitations, but I suspect a big part of it is stress and anxiety, as she reports constant symptoms throughout the day.  We discussed metoprolol, but it would be better to address her stress/anxiety.  This is especially true given that her BP is low normal.   # Atypical chest pain: ETT was negative for ischemia.   # CV Disease Prevention: Check lipids and CMP.   Current medicines are reviewed at length with the patient today.  The patient does not have concerns regarding medicines.  The following changes have been made:  no change  Labs/ tests ordered today include:   Orders Placed This Encounter  Procedures  . Lipid panel  . Comprehensive metabolic panel     Disposition:   FU with Anniemae Haberkorn C.  Duke Salvia, MD, [facility]  In 4-6 weeks.    This note was written with the assistance of speech recognition software.  Please excuse any transcriptional errors.  Signed, Spencer Peterkin C. Duke Salvia, MD, [facility]  04/04/2017 5:45 PM    [facility]

## 2017-04-04 NOTE — Patient Instructions (Addendum)
Medication Instructions:  Your physician recommends that you continue on your current medications as directed. Please refer to the Current Medication list given to you today.  Labwork: Fasting lp/cmet soon   Testing/Procedures: none  Follow-Up: Your physician recommends that you schedule a follow-up appointment in: 3 month ov  If you need a refill on your cardiac medications before your next appointment, please call your pharmacy.

## 2017-04-09 ENCOUNTER — Telehealth: Payer: Self-pay | Admitting: Cardiovascular Disease

## 2017-04-09 DIAGNOSIS — Z13220 Encounter for screening for lipoid disorders: Secondary | ICD-10-CM | POA: Diagnosis not present

## 2017-04-09 LAB — COMPREHENSIVE METABOLIC PANEL
ALK PHOS: 77 IU/L (ref 39–117)
ALT: 5 IU/L (ref 0–32)
AST: 17 IU/L (ref 0–40)
Albumin/Globulin Ratio: 2 (ref 1.2–2.2)
Albumin: 4.5 g/dL (ref 3.5–5.5)
BILIRUBIN TOTAL: 0.3 mg/dL (ref 0.0–1.2)
BUN/Creatinine Ratio: 17 (ref 9–23)
BUN: 18 mg/dL (ref 6–24)
CO2: 21 mmol/L (ref 20–29)
Calcium: 9.6 mg/dL (ref 8.7–10.2)
Chloride: 104 mmol/L (ref 96–106)
Creatinine, Ser: 1.05 mg/dL — ABNORMAL HIGH (ref 0.57–1.00)
GFR calc Af Amer: 72 mL/min/{1.73_m2} (ref >59)
GFR calc non Af Amer: 62 mL/min/{1.73_m2} (ref >59)
Globulin, Total: 2.3 g/dL (ref 1.5–4.5)
Glucose: 92 mg/dL (ref 65–99)
Potassium: 4 mmol/L (ref 3.5–5.2)
Sodium: 141 mmol/L (ref 134–144)
Total Protein: 6.8 g/dL (ref 6.0–8.5)

## 2017-04-09 LAB — LIPID PANEL
Chol/HDL Ratio: 1.9 ratio (ref 0.0–4.4)
Cholesterol, Total: 233 mg/dL — ABNORMAL HIGH (ref 100–199)
HDL: 121 mg/dL (ref >39)
LDL Calculated: 98 mg/dL (ref 0–99)
Triglycerides: 68 mg/dL (ref 0–149)
VLDL Cholesterol Cal: 14 mg/dL (ref 5–40)

## 2017-04-09 NOTE — Telephone Encounter (Signed)
New message    STAT if HR is under 50 or over 120 (normal HR is 60-100 beats per minute)  1) What is your heart rate? Now-65, pt states her pulse got to 189 on Thursday while she was exercising and then wouldn't register on her apple watch  2) Do you have a log of your heart rate readings (document readings)? no  3) Do you have any other symptoms? Pt states she is having more frequent pvc and pain.

## 2017-04-09 NOTE — Telephone Encounter (Signed)
Returned call to pt with known Parkinson's disease, she states that she was at her parkingson's spin class and her HR went up to 189 last Thursday but this was the only time.she states that she had some chest pain along with these PVC's this time, she states that this was relieved by resting. She states that she has these at least 50 times intermittently thru-out the day. She states that she is in the grocery store right now and is very SOB and coughing these PVC have been happening quite a few times during this shopping time. She states that she does experiences chest tightness quite a few times with the PVC. She states that she will try to see how her HR is running in the future when these happen. She states that she recently had a monitor and had a few occasional PVCs but no arrhythmias, she wants to know if these were when she is SOB and coughing. She states that she is fatigued most of the day, she states that this is probably due to her parkinson's. She will check and make a log of her HR around the times when PVC's are happening. She just wanted to let you know.

## 2017-04-09 NOTE — Telephone Encounter (Signed)
Spoke with patient and heart rate went to 158 during spin class (for patients with Parkinson's).  Confirmed with her that was the highest heart rate. She was having some discomfort in chest during that time and she backed off intesity, heart rate decreased quickly.  She had not been to the class in several weeks but is trying to get on a more consistent regimen. She wanted Dr Duke Salvia to be aware and make sure no other testing needed. Will forward for review

## 2017-04-10 NOTE — Telephone Encounter (Signed)
This is a normal heart rate with exercise, especially if she hasn't been working out lately.  Her stress and monitor were very reassuring.  Just ease back into exercising slowly.

## 2017-04-10 NOTE — Telephone Encounter (Signed)
Left message to call back  

## 2017-04-11 NOTE — Telephone Encounter (Signed)
Pt returning call for rn °

## 2017-04-11 NOTE — Telephone Encounter (Signed)
Advised patient

## 2017-05-09 DIAGNOSIS — J019 Acute sinusitis, unspecified: Secondary | ICD-10-CM | POA: Diagnosis not present

## 2017-05-09 DIAGNOSIS — Z6822 Body mass index (BMI) 22.0-22.9, adult: Secondary | ICD-10-CM | POA: Diagnosis not present

## 2017-05-09 DIAGNOSIS — J029 Acute pharyngitis, unspecified: Secondary | ICD-10-CM | POA: Diagnosis not present

## 2017-05-09 DIAGNOSIS — R05 Cough: Secondary | ICD-10-CM | POA: Diagnosis not present

## 2017-05-25 ENCOUNTER — Telehealth: Payer: Self-pay | Admitting: Physician Assistant

## 2017-05-25 NOTE — Telephone Encounter (Signed)
Paged by Mrs. Sowash regarding palpitation and chest pain and recurrent PVC, but 1st call back no answer, will try again in 10 min.  Ramond DialSigned, Salome Cozby PA Pager: [phone]

## 2017-05-25 NOTE — Telephone Encounter (Signed)
Patient with PMH of PVCs seen on holter monitor and negative ETT in Sept 2018 contacted after hour answering service with increase SOB and chest pain that last hours at a time for the past few weeks. She also mentions she has GERD, advised patient to take Zantac and wait 30 min, if it does not get better then seek medical attention.   Ramond DialSigned, Kwesi Sangha PA Pager: [phone]

## 2017-05-28 ENCOUNTER — Telehealth: Payer: Self-pay | Admitting: Cardiovascular Disease

## 2017-05-28 NOTE — Progress Notes (Signed)
Cardiology Office Note   Date:  05/29/2017   ID:  Kristina Lewis, DOB May 15, 1966, MRN [id number]  PCP:  Shawnie Dapper, PA-C  Cardiologist:   Chilton Si, MD   No chief complaint on file.    History of Present Illness: Kristina Lewis is a 51 y.o. female with PVCs and Parkinson's disease though who presents for follow up.  She was initially seen 03/2017 for evaluation of palpitations and chest heaviness.  [name] saw Dr. Assunta Found 10/2016 and reported palpitations.  Her palpitations have been intermittent for the last year.  She was referred for a 24 hour Holter that showed occasional PVCs but no arrhythmias.  She continues to have palpitations multiple times per day that occur randomly.  She continues with exercise and has no chest pain with exertion.  She hasn't noted any lower extremity edema, orthpnea or PND.  She is considering having a deep brain stimulator implanted at [facility].  [name] also reported exertional chest pain and was referred for an ETT that was negative for ischemia.  She achieved 10.1 METs on a Bruce protocol.    [name] has been experiencing more palpitations lately. She feels these up to 35 times daily.  They occur at rest and are worse when lying down at night.  She also continues to have chest tightness.  She gets shooting pain while driving down the road.  She hasn't been as active lately because she has been busy with her family.  Her son had surgery last month and is recovering well.  She has been more stressed lately.  She does exercise and generally does not have chest pain with exertion such as walking but occasionally has it when doing her spin classes.  There is no associated nausea or diaphoresis.  She also notes that she gets short of breath more easily.  She is very concerned that her symptoms could be due to heart disease given that her father has atrial fibrillation and coronary artery disease.  She also wonders if her symptoms could be  attributable to GERD or her hiatal hernia. She has been belching a lot lately. She also reports a few episodes of dizziness while standing.  She denies syncope.  She notes that she has been so busy lately that she hasn't been eating or drinking well.  She denies lower extremity edema, orthopnea, or PND.  Past Medical History:  Diagnosis Date  . Anxiety   . Atypical chest pain 03/15/2017  . Bulging disc    CERVICAL  . Hemorrhoids   . IC (interstitial cystitis)    bladder stimulator 10/2012  . Left leg pain   . Palpitations 03/15/2017  . Parkinson's disease (HCC)   . PONV (postoperative nausea and vomiting)   . PVC (premature ventricular contraction) 04/04/2017  . Seasonal allergies   . Urge incontinence     Past Surgical History:  Procedure Laterality Date  . BIOPSY N/A 10/27/2014   Procedure: BIOPSY;  Surgeon: Corbin Ade, MD;  Location: [facility];  Service: Endoscopy;  Laterality: N/A;  duodenal, esophageal  . BREAST ENHANCEMENT SURGERY  AGE 7  . CESAREAN SECTION  12/2000  &  01-26-2003   X2  . COLONOSCOPY WITH PROPOFOL N/A 10/27/2014   Dr. Jena Gauss: Minimal internal heorrhoids;otherwise normal rectum, colon and terminal ileum.   . CYSTO/ URETHRAL DILATION/ HOD  X2  LAST ONE 12-19-2004  . ESOPHAGEAL DILATION N/A 10/27/2014   Procedure: ESOPHAGEAL DILATION;  Surgeon: Gerrit Friends  Rourk, MD;  Location: [facility];  Service: Endoscopy;  Laterality: N/AElease Kristina Lewis 53  . ESOPHAGOGASTRODUODENOSCOPY (EGD) WITH PROPOFOL N/A 10/27/2014   Dr. Jena Gauss: Normal EGD. status post maloney dilation follwed by esophageal and duodenal biopsy. benign esophageal and duodenal biopsies  . HEMORRHOID BANDING  2016   Dr.Rourk  . INTERSTIM IMPLANT PLACEMENT N/A 10/08/2012   Procedure: [name] IMPLANT FIRST STAGE;  Surgeon: Martina Sinner, MD;  Location: [facility];  Service: Urology;  Laterality: N/A;  . INTERSTIM IMPLANT PLACEMENT N/A 10/08/2012   Procedure: [name] IMPLANT SECOND STAGE;  Surgeon: Martina Sinner, MD;  Location: [facility];  Service: Urology;  Laterality: N/A;  . LASIK     bilateral  . TONSILLECTOMY  AGE 75'S     Current Outpatient Medications  Medication Sig Dispense Refill  . ALPRAZolam (XANAX) 1 MG tablet Take by mouth at bedtime. 1 1/2 mg    . baclofen (LIORESAL) 10 MG tablet Take 10 mg by mouth 2 (two) times daily as needed.    . Carbidopa-Levodopa ER 23.75-95 MG CPCR Take 1 capsule by mouth every 3 (three) hours.     . Esomeprazole Magnesium (NEXIUM PO) Take by mouth daily.    Marland Kitchen FLUoxetine (PROZAC) 20 MG tablet Take by mouth as directed. 2 tablet by mouth once daily    . ibuprofen (ADVIL,MOTRIN) 200 MG tablet Take 200 mg by mouth every 6 (six) hours as needed for pain.    . ranitidine (ZANTAC) 150 MG capsule Take 150 mg by mouth 2 (two) times daily.    Marland Kitchen zonisamide (ZONEGRAN) 100 MG capsule Take 400 mg by mouth daily.      No current facility-administered medications for this visit.     Allergies:   Other; Tioconazole; Monistat [miconazole]; and Tape    Social History:  The patient  reports that  has never smoked. she has never used smokeless tobacco. She reports that she does not drink alcohol or use drugs.   Family History:  The patient's family history includes Atrial fibrillation in her father; Breast cancer in her maternal aunt; CAD in her father; Cancer in her cousin and father; Diabetes in her father, mother, and paternal grandfather; Heart disease in her maternal grandmother; Hypertension in her father and maternal grandmother; Stroke in her paternal grandmother.    ROS:  Please see the history of present illness.   Otherwise, review of systems are positive for L shoulder pain.   All other systems are reviewed and negative.    PHYSICAL EXAM: VS:  BP (!) 88/60   Pulse (!) 59   Ht 5\' 4"  (1.626 m)   Wt 133 lb 3.2 oz (60.4 kg)   BMI 22.86 kg/m  , BMI Body mass index is 22.86 kg/m. GENERAL:  Well appearing.  Anxious HEENT: Pupils  equal round and reactive, fundi not visualized, oral mucosa unremarkable NECK:  No jugular venous distention, waveform within normal limits, carotid upstroke brisk and symmetric, no bruits, no thyromegaly LUNGS:  Clear to auscultation bilaterally.  No crackles, wheezes or rhonchi.  HEART:  RRR.  PMI not displaced or sustained,S1 and S2 within normal limits, no S3, no S4, no clicks, no rubs, no murmurs ABD:  Flat, positive bowel sounds normal in frequency in pitch, no bruits, no rebound, no guarding, no midline pulsatile mass, no hepatomegaly, no splenomegaly EXT:  2 plus pulses throughout, no edema, no cyanosis no clubbing SKIN:  No rashes no nodules NEURO:  Cranial nerves II  through XII grossly intact, motor grossly intact throughout [facility]:  Cognitively intact, oriented to person place and time   EKG:  EKG is ordered today. The ekg ordered 03/08/17 demonstrates sinus rhythm, sinus arrhythmia.  Rate 74 bpm.   05/29/17: Sinus bradycardia.  Rate 59 bpm.    24 Hour Holter Monitor 03/21/17:  Quality: Fair.  Baseline artifact. Predominant rhythm: sinus rhythm. Average heart rate: 85 bpm Max heart rate: 134 bpm Min heart rate: 57 bpm  Occasional PVCs.  No arrhythmias noted.   ETT 03/20/17:  Blood pressure demonstrated a normal response to exercise.  ST segment depression of 0.5 mm was noted during stress in the II, III, aVF and V6 leads, beginning at 5 minutes of stress, and returning to baseline after less than 1 minute of recovery.   Normal ECG stress test. <1 mm ST segment depression during exercise does not meet criteria for ischemia diagnosis.  Recent Labs: 03/08/2017: Hemoglobin 13.7; Magnesium 2.0; Platelets 237; TSH 1.610 04/09/2017: ALT 5; BUN 18; Creatinine, Ser 1.05; Potassium 4.0; Sodium 141     Lipid Panel    Component Value Date/Time   CHOL 233 (H) 04/09/2017 1058   TRIG 68 04/09/2017 1058   HDL 121 04/09/2017 1058   CHOLHDL 1.9 04/09/2017 1058   CHOLHDL 2.0  08/17/2011 1020   VLDL 10 08/17/2011 1020   LDLCALC 98 04/09/2017 1058      Wt Readings from Last 3 Encounters:  05/29/17 133 lb 3.2 oz (60.4 kg)  04/04/17 128 lb (58.1 kg)  03/08/17 127 lb 3.2 oz (57.7 kg)      ASSESSMENT AND PLAN:  # PVCs: Two PVCs noted on her Holter.  She reports much more frequent palpitations than can be explained by her PVCs.  I suspect that anxiety is playing a huge role.  She notes that her depression is semi-controlled.  I recommended that she follow up with her PCP and consider  counseling.  Given her low blood pressure and heart rate adding metoprolol would likely make her symptoms worse and not better.  # Atypical chest pain: Symptoms remain atypical.  ETT was negative for ischemia.  However, she is not reassured, especially given her family history.  We will get a coronary CT-A to more definitively evaluate her coronaries.  Also consider GERD and hiatal hernia as causes.   # Dizziness: 2/2 orthostasis and hypotension.  Recommended that she increase her fluid and salt intake.     Current medicines are reviewed at length with the patient today.  The patient does not have concerns regarding medicines.  The following changes have been made:  no change  Labs/ tests ordered today include:   Orders Placed This Encounter  Procedures  . CT CORONARY MORPH W/CTA COR W/SCORE W/CA W/CM &/OR WO/CM  . CT CORONARY FRACTIONAL FLOW RESERVE DATA PREP  . CT CORONARY FRACTIONAL FLOW RESERVE FLUID ANALYSIS  . Basic metabolic panel  . EKG 12-Lead     Disposition:   FU with Kemper Heupel C. Duke Salvia, MD, [facility]  In 4-6 weeks.    This note was written with the assistance of speech recognition software.  Please excuse any transcriptional errors.  Signed, Ambriella Kitt C. Duke Salvia, MD, [facility]  05/29/2017 11:13 AM    [facility]

## 2017-05-28 NOTE — Telephone Encounter (Signed)
New Message     Pt c/o of Chest Pain: STAT if CP now or developed within 24 hours  1. Are you having CP right now?  Yes   2. Are you experiencing any other symptoms (ex. SOB, nausea, vomiting, sweating)?  Sob and pvc getting worse , tightness in chest   3. How long have you been experiencing CP?  For a while but it is getting worse over the weekend   4. Is your CP continuous or coming and going?  continous   5. Have you taken Nitroglycerin? No took a xanax and it did ease up some but not , did not go to er like she was advised  ?

## 2017-05-28 NOTE — Telephone Encounter (Signed)
Pt of Dr. Duke Salviaandolph  States she has noted increased PVCs x 1 week Increased SOB over same duration. States her chest feels tight at times.  Notes these are longstanding issues and that it "used to just happen every once in a while", now seems she has either chest pain or tightness "all the time".  She called 11/23 and spoke to Azalee CourseHao Meng PA (on-call service) and followed advice of using Zantac.  Notes the Zantac eased her symptoms "but not for long" and that she still had some underlying discomfort.  Pt not on BB, Dr. Duke Salviaandolph had recommended in the past, but pt explains that she was hesitant to go on this medication d/t a recent diagnosis of Parkinsons disease and already being on multiple medications.  I affirmed advice as given by Wynema BirchHao regarding chest discomfort, including recommendation to seek medical attention (ED, 911) if discomfort worsens or is unrelieved by Zantac.  Added for visit on open provider slot tomorrow to discuss further. Pt states some willingness to revisit discussion of BB therapy.

## 2017-05-29 ENCOUNTER — Encounter: Payer: Self-pay | Admitting: Cardiovascular Disease

## 2017-05-29 ENCOUNTER — Ambulatory Visit: Payer: BLUE CROSS/BLUE SHIELD | Admitting: Cardiovascular Disease

## 2017-05-29 VITALS — BP 88/60 | HR 59 | Ht 64.0 in | Wt 133.2 lb

## 2017-05-29 DIAGNOSIS — R002 Palpitations: Secondary | ICD-10-CM

## 2017-05-29 DIAGNOSIS — R079 Chest pain, unspecified: Secondary | ICD-10-CM | POA: Diagnosis not present

## 2017-05-29 DIAGNOSIS — R42 Dizziness and giddiness: Secondary | ICD-10-CM | POA: Diagnosis not present

## 2017-05-29 DIAGNOSIS — I493 Ventricular premature depolarization: Secondary | ICD-10-CM

## 2017-05-29 DIAGNOSIS — F419 Anxiety disorder, unspecified: Secondary | ICD-10-CM

## 2017-05-29 LAB — BASIC METABOLIC PANEL
BUN / CREAT RATIO: 14 (ref 9–23)
BUN: 13 mg/dL (ref 6–24)
CO2: 25 mmol/L (ref 20–29)
Calcium: 9.6 mg/dL (ref 8.7–10.2)
Chloride: 102 mmol/L (ref 96–106)
Creatinine, Ser: 0.96 mg/dL (ref 0.57–1.00)
GFR calc Af Amer: 80 mL/min/{1.73_m2} (ref >59)
GFR calc non Af Amer: 69 mL/min/{1.73_m2} (ref >59)
GLUCOSE: 78 mg/dL (ref 65–99)
Potassium: 4.2 mmol/L (ref 3.5–5.2)
SODIUM: 140 mmol/L (ref 134–144)

## 2017-05-29 NOTE — Telephone Encounter (Signed)
Patient seen by Dr [place] today  

## 2017-05-29 NOTE — Patient Instructions (Addendum)
Medication Instructions:  Your physician recommends that you continue on your current medications as directed. Please refer to the Current Medication list given to you today.  Labwork: BMET TODAY   Testing/Procedures: Your physician has requested that you have cardiac CT. Cardiac computed tomography (CT) is a painless test that uses an x-ray machine to take clear, detailed pictures of your heart. For further information please visit [email or web address]. Please follow instruction sheet as given.  Follow-Up: KEEP FOLLOW UP AS SCHEDULED   Any Other Special Instructions Will Be Listed Below (If Applicable).  Please [facility] at xx:xx AM (30-45 minutes prior to test start time)  [facility] [address] [place], [place] [address] [phone]  Proceed to the [facility] Radiology Department (First Floor).  Please follow these instructions carefully (unless otherwise directed):  Hold all erectile dysfunction medications at least 48 hours prior to test.  On the Night Before the Test: . Drink plenty of water. . Do not consume any caffeinated/decaffeinated beverages or chocolate 12 hours prior to your test. . Do not take any antihistamines 12 hours prior to your test. . If you take Metformin do not take 24 hours prior to test. . If the patient has contrast allergy: ? Patient will need a prescription for Prednisone and very clear instructions (as follows): 1. Prednisone 50 mg - take 13 hours prior to test 2. Take another Prednisone 50 mg 7 hours prior to test 3. Take another Prednisone 50 mg 1 hour prior to test 4. Take Benadryl 50 mg 1 hour prior to test . Patient must complete all four doses of above prophylactic medications. . Patient will need a ride after test due to Benadryl.  On the Day of the Test: . Drink plenty of water. Do not drink any water within one hour of the test. . Do not eat any food 4 hours prior  to the test. . You may take your regular medications prior to the test.  After the Test: . Drink plenty of water. . After receiving IV contrast, you may experience a mild flushed feeling. This is normal. . On occasion, you may experience a mild rash up to 24 hours after the test. This is not dangerous. If this occurs, you can take Benadryl 25 mg and increase your fluid intake. . If you experience trouble breathing, this can be serious. If it is severe call 911 IMMEDIATELY. If it is mild, please call our office. . If you take any of these medications: Glipizide/Metformin, Avandament, Glucavance, please do not take 48 hours after completing test.

## 2017-06-07 DIAGNOSIS — Z23 Encounter for immunization: Secondary | ICD-10-CM | POA: Diagnosis not present

## 2017-06-28 ENCOUNTER — Encounter: Payer: Self-pay | Admitting: *Deleted

## 2017-07-05 NOTE — Progress Notes (Deleted)
Cardiology Office Note   Date:  07/05/2017   ID:  Kristina Lewis, DOB 04/01/1966, MRN [id number]  PCP:  Shawnie Dapper, PA-C  Cardiologist:   Chilton Si, MD   No chief complaint on file.    History of Present Illness: Kristina Lewis is a 52 y.o. female with PVCs and Parkinson's disease though who presents for follow up.  She was initially seen 03/2017 for evaluation of palpitations and chest heaviness.  Kristina Lewis saw Dr. Assunta Found 10/2016 and reported palpitations.  Her palpitations have been intermittent for the last year.  She was referred for a 24 hour Holter that showed occasional PVCs but no arrhythmias.  She continues to have palpitations multiple times per day that occur randomly.  She continues with exercise and has no chest pain with exertion.  She hasn't noted any lower extremity edema, orthpnea or PND.  She is considering having a deep brain stimulator implanted at [facility].  Kristina Lewis also reported exertional chest pain and was referred for an ETT that was negative for ischemia.  She achieved 10.1 METs on a Bruce protocol.    Kristina Lewis has been experiencing more palpitations lately. She feels these up to 35 times daily.  They occur at rest and are worse when lying down at night.  She also continues to have chest tightness.  She gets shooting pain while driving down the road.  She hasn't been as active lately because she has been busy with her family.  Her son had surgery last month and is recovering well.  She has been more stressed lately.  She does exercise and generally does not have chest pain with exertion such as walking but occasionally has it when doing her spin classes.  There is no associated nausea or diaphoresis.  She also notes that she gets short of breath more easily.  She is very concerned that her symptoms could be due to heart disease given that her father has atrial fibrillation and coronary artery disease.  She also wonders if her symptoms could be  attributable to GERD or her hiatal hernia. She has been belching a lot lately. She also reports a few episodes of dizziness while standing.  She denies syncope.  She notes that she has been so busy lately that she hasn't been eating or drinking well.  She denies lower extremity edema, orthopnea, or PND.  Past Medical History:  Diagnosis Date  . Anxiety   . Atypical chest pain 03/15/2017  . Bulging disc    CERVICAL  . Hemorrhoids   . IC (interstitial cystitis)    bladder stimulator 10/2012  . Left leg pain   . Palpitations 03/15/2017  . Parkinson's disease (HCC)   . PONV (postoperative nausea and vomiting)   . PVC (premature ventricular contraction) 04/04/2017  . Seasonal allergies   . Urge incontinence     Past Surgical History:  Procedure Laterality Date  . BIOPSY N/A 10/27/2014   Procedure: BIOPSY;  Surgeon: Corbin Ade, MD;  Location: [facility];  Service: Endoscopy;  Laterality: N/A;  duodenal, esophageal  . BREAST ENHANCEMENT SURGERY  AGE 31  . CESAREAN SECTION  12/2000  &  01-26-2003   X2  . COLONOSCOPY WITH PROPOFOL N/A 10/27/2014   Dr. Jena Gauss: Minimal internal heorrhoids;otherwise normal rectum, colon and terminal ileum.   . CYSTO/ URETHRAL DILATION/ HOD  X2  LAST ONE 12-19-2004  . ESOPHAGEAL DILATION N/A 10/27/2014   Procedure: ESOPHAGEAL DILATION;  Surgeon: Gerrit Friends  Rourk, MD;  Location: [facility];  Service: Endoscopy;  Laterality: N/AElease Hashimoto 53  . ESOPHAGOGASTRODUODENOSCOPY (EGD) WITH PROPOFOL N/A 10/27/2014   Dr. Jena Gauss: Normal EGD. status post maloney dilation follwed by esophageal and duodenal biopsy. benign esophageal and duodenal biopsies  . HEMORRHOID BANDING  2016   Dr.Rourk  . INTERSTIM IMPLANT PLACEMENT N/A 10/08/2012   Procedure: [name] IMPLANT FIRST STAGE;  Surgeon: Martina Sinner, MD;  Location: [facility];  Service: Urology;  Laterality: N/A;  . INTERSTIM IMPLANT PLACEMENT N/A 10/08/2012   Procedure: [name] IMPLANT SECOND STAGE;  Surgeon: Martina Sinner, MD;  Location: [facility];  Service: Urology;  Laterality: N/A;  . LASIK     bilateral  . TONSILLECTOMY  AGE 14'S     Current Outpatient Medications  Medication Sig Dispense Refill  . ALPRAZolam (XANAX) 1 MG tablet Take by mouth at bedtime. 1 1/2 mg    . baclofen (LIORESAL) 10 MG tablet Take 10 mg by mouth 2 (two) times daily as needed.    . Carbidopa-Levodopa ER 23.75-95 MG CPCR Take 1 capsule by mouth every 3 (three) hours.     . Esomeprazole Magnesium (NEXIUM PO) Take by mouth daily.    Marland Kitchen FLUoxetine (PROZAC) 20 MG tablet Take by mouth as directed. 2 tablet by mouth once daily    . ibuprofen (ADVIL,MOTRIN) 200 MG tablet Take 200 mg by mouth every 6 (six) hours as needed for pain.    . ranitidine (ZANTAC) 150 MG capsule Take 150 mg by mouth 2 (two) times daily.    Marland Kitchen zonisamide (ZONEGRAN) 100 MG capsule Take 400 mg by mouth daily.      No current facility-administered medications for this visit.     Allergies:   Other; Tioconazole; Monistat [miconazole]; and Tape    Social History:  The patient  reports that  has never smoked. she has never used smokeless tobacco. She reports that she does not drink alcohol or use drugs.   Family History:  The patient's family history includes Atrial fibrillation in her father; Breast cancer in her maternal aunt; CAD in her father; Cancer in her cousin and father; Diabetes in her father, mother, and paternal grandfather; Heart disease in her maternal grandmother; Hypertension in her father and maternal grandmother; Stroke in her paternal grandmother.    ROS:  Please see the history of present illness.   Otherwise, review of systems are positive for L shoulder pain.   All other systems are reviewed and negative.    PHYSICAL EXAM: VS:  There were no vitals taken for this visit. , BMI There is no height or weight on file to calculate BMI. GENERAL:  Well appearing.  Anxious HEENT: Pupils equal round and reactive, fundi not  visualized, oral mucosa unremarkable NECK:  No jugular venous distention, waveform within normal limits, carotid upstroke brisk and symmetric, no bruits, no thyromegaly LUNGS:  Clear to auscultation bilaterally.  No crackles, wheezes or rhonchi.  HEART:  RRR.  PMI not displaced or sustained,S1 and S2 within normal limits, no S3, no S4, no clicks, no rubs, no murmurs ABD:  Flat, positive bowel sounds normal in frequency in pitch, no bruits, no rebound, no guarding, no midline pulsatile mass, no hepatomegaly, no splenomegaly EXT:  2 plus pulses throughout, no edema, no cyanosis no clubbing SKIN:  No rashes no nodules NEURO:  Cranial nerves II through XII grossly intact, motor grossly intact throughout PSYCH:  Cognitively intact, oriented to person place and  time   EKG:  EKG is ordered today. The ekg ordered 03/08/17 demonstrates sinus rhythm, sinus arrhythmia.  Rate 74 bpm.   05/29/17: Sinus bradycardia.  Rate 59 bpm.    24 Hour Holter Monitor 03/21/17:  Quality: Fair.  Baseline artifact. Predominant rhythm: sinus rhythm. Average heart rate: 85 bpm Max heart rate: 134 bpm Min heart rate: 57 bpm  Occasional PVCs.  No arrhythmias noted.   ETT 03/20/17:  Blood pressure demonstrated a normal response to exercise.  ST segment depression of 0.5 mm was noted during stress in the II, III, aVF and V6 leads, beginning at 5 minutes of stress, and returning to baseline after less than 1 minute of recovery.   Normal ECG stress test. <1 mm ST segment depression during exercise does not meet criteria for ischemia diagnosis.  Recent Labs: 03/08/2017: Hemoglobin 13.7; Magnesium 2.0; Platelets 237; TSH 1.610 04/09/2017: ALT 5 05/29/2017: BUN 13; Creatinine, Ser 0.96; Potassium 4.2; Sodium 140     Lipid Panel    Component Value Date/Time   CHOL 233 (H) 04/09/2017 1058   TRIG 68 04/09/2017 1058   HDL 121 04/09/2017 1058   CHOLHDL 1.9 04/09/2017 1058   CHOLHDL 2.0 08/17/2011 1020   VLDL 10  08/17/2011 1020   LDLCALC 98 04/09/2017 1058      Wt Readings from Last 3 Encounters:  05/29/17 133 lb 3.2 oz (60.4 kg)  04/04/17 128 lb (58.1 kg)  03/08/17 127 lb 3.2 oz (57.7 kg)      ASSESSMENT AND PLAN:  # PVCs: Two PVCs noted on her Holter.  She reports much more frequent palpitations than can be explained by her PVCs.  I suspect that anxiety is playing a huge role.  She notes that her depression is semi-controlled.  I recommended that she follow up with her PCP and consider  counseling.  Given her low blood pressure and heart rate adding metoprolol would likely make her symptoms worse and not better.  # Atypical chest pain: Symptoms remain atypical.  ETT was negative for ischemia.  However, she is not reassured, especially given her family history.  We will get a coronary CT-A to more definitively evaluate her coronaries.  Also consider GERD and hiatal hernia as causes.   # Dizziness: 2/2 orthostasis and hypotension.  Recommended that she increase her fluid and salt intake.     Current medicines are reviewed at length with the patient today.  The patient does not have concerns regarding medicines.  The following changes have been made:  no change  Labs/ tests ordered today include:   No orders of the defined types were placed in this encounter.    Disposition:   FU with Riki Berninger C. Duke Salvia, MD, [facility]  In 4-6 weeks.    This note was written with the assistance of speech recognition software.  Please excuse any transcriptional errors.  Signed, Kayle Correa C. Duke Salvia, MD, [facility]  07/05/2017 5:44 PM    [facility]

## 2017-07-06 ENCOUNTER — Ambulatory Visit: Payer: BLUE CROSS/BLUE SHIELD | Admitting: Cardiovascular Disease

## 2017-07-09 DIAGNOSIS — F411 Generalized anxiety disorder: Secondary | ICD-10-CM | POA: Diagnosis not present

## 2017-07-09 DIAGNOSIS — G249 Dystonia, unspecified: Secondary | ICD-10-CM | POA: Diagnosis not present

## 2017-07-09 DIAGNOSIS — G20 Parkinson's disease: Secondary | ICD-10-CM | POA: Diagnosis not present

## 2017-07-18 ENCOUNTER — Encounter: Payer: Self-pay | Admitting: Cardiovascular Disease

## 2017-07-21 DIAGNOSIS — H40033 Anatomical narrow angle, bilateral: Secondary | ICD-10-CM | POA: Diagnosis not present

## 2017-07-21 DIAGNOSIS — H04123 Dry eye syndrome of bilateral lacrimal glands: Secondary | ICD-10-CM | POA: Diagnosis not present

## 2017-07-28 ENCOUNTER — Telehealth: Payer: Self-pay | Admitting: Physician Assistant

## 2017-07-28 NOTE — Telephone Encounter (Addendum)
Kristina Lewis is a 52 y.o. female followed by Dr. Chilton Siiffany [place] for a hx of chest pain and palpitations.  She has a history of PVCs and early onset Parkinson's.  She called into the answering service today with complaints of chest discomfort.  She helped her father shoveled gravel yesterday and is quite sore.  She notes pain with movement as well as deep breathing.  She seems to be in a significant amount of pain on the phone.  She is not short of breath, nauseated or diaphoretic.  Of note, a Coronary CTA is pending.  I advised her that the only way to completely evaluate her is by being seen today.  I have recommended that she go to the emergency room.  She will consider this and make a decision later. Kristina NewcomerScott Jarion Hawthorne, PA-C    07/28/2017 8:19 AM

## 2017-09-12 ENCOUNTER — Telehealth: Payer: Self-pay | Admitting: *Deleted

## 2017-09-12 NOTE — Telephone Encounter (Signed)
Received notification from scheduling multiple attempts made to patient to schedule Cardiac CT and unable to reach patient. Spoke with patient and she has had a great deal going on, her father just passed away last week. She would like to proceed but there is the cost of it at this time. Did speak with billing department and test is around $1,800. Advised patient and she will call back if/when she would like to proceed

## 2017-09-18 DIAGNOSIS — G20 Parkinson's disease: Secondary | ICD-10-CM | POA: Diagnosis not present

## 2017-09-18 DIAGNOSIS — G43709 Chronic migraine without aura, not intractable, without status migrainosus: Secondary | ICD-10-CM | POA: Diagnosis not present

## 2017-09-18 DIAGNOSIS — F331 Major depressive disorder, recurrent, moderate: Secondary | ICD-10-CM | POA: Diagnosis not present

## 2017-09-24 DIAGNOSIS — F331 Major depressive disorder, recurrent, moderate: Secondary | ICD-10-CM | POA: Diagnosis not present

## 2017-09-28 DIAGNOSIS — N3010 Interstitial cystitis (chronic) without hematuria: Secondary | ICD-10-CM | POA: Diagnosis not present

## 2017-09-28 DIAGNOSIS — R350 Frequency of micturition: Secondary | ICD-10-CM | POA: Diagnosis not present

## 2017-09-28 DIAGNOSIS — R351 Nocturia: Secondary | ICD-10-CM | POA: Diagnosis not present

## 2017-10-29 ENCOUNTER — Telehealth: Payer: Self-pay | Admitting: Cardiovascular Disease

## 2017-10-29 NOTE — Telephone Encounter (Signed)
Discussed with Dr [facility] Salvia and she feels that patient needs to proceed with Cardiac CT as previously ordered versus cardiac cath given the potential risks with cath. Patient had a low risk ETT 03/20/17 and has low risk factors. Advised patient and she will think about the Cardiac CT since her deductible is high. She will call back with what she decides.

## 2017-10-29 NOTE — Telephone Encounter (Signed)
Pt calling:  Pt c/o of Chest Pain: 1. Are you having CP right now? No 2. Are you experiencing any other symptoms (ex. SOB, nausea, vomiting, sweating)? Chest Tightness/SOB 3. How long have you been experiencing CP? Chest Tightness 4. Is your CP continuous or coming and going? Coming and going 5. Have you taken Nitroglycerin? No   Pt stated she has a lot of stress in her life

## 2017-10-29 NOTE — Telephone Encounter (Signed)
Patient called in stating that she has been having some chest pain and chest tightness. She states that the pain is 3/10 but at times has been a 7/10. She did state that she is under a lot of stress right now and does not know if this is stress and/or cardiac related. The patient has been advised to go to the ED but has refused. She has also been offered an appointment but stated that she wants to know what Dr. Leonides Sake opinion is first on skipping a cardiac ct and having a heart cath instead. She has been advised that she would need an appointment first but declined. Message will be routed the the provider for her knowledge.

## 2017-11-19 DIAGNOSIS — M1812 Unilateral primary osteoarthritis of first carpometacarpal joint, left hand: Secondary | ICD-10-CM | POA: Diagnosis not present

## 2017-11-19 DIAGNOSIS — M79645 Pain in left finger(s): Secondary | ICD-10-CM | POA: Diagnosis not present

## 2017-12-03 DIAGNOSIS — F331 Major depressive disorder, recurrent, moderate: Secondary | ICD-10-CM | POA: Diagnosis not present

## 2017-12-18 DIAGNOSIS — Z803 Family history of malignant neoplasm of breast: Secondary | ICD-10-CM | POA: Diagnosis not present

## 2017-12-18 DIAGNOSIS — Z8042 Family history of malignant neoplasm of prostate: Secondary | ICD-10-CM | POA: Diagnosis not present

## 2017-12-18 DIAGNOSIS — Z6822 Body mass index (BMI) 22.0-22.9, adult: Secondary | ICD-10-CM | POA: Diagnosis not present

## 2017-12-18 DIAGNOSIS — Z01419 Encounter for gynecological examination (general) (routine) without abnormal findings: Secondary | ICD-10-CM | POA: Diagnosis not present

## 2017-12-18 DIAGNOSIS — Z1231 Encounter for screening mammogram for malignant neoplasm of breast: Secondary | ICD-10-CM | POA: Diagnosis not present

## 2017-12-25 DIAGNOSIS — G8918 Other acute postprocedural pain: Secondary | ICD-10-CM | POA: Diagnosis not present

## 2017-12-25 DIAGNOSIS — S6992XA Unspecified injury of left wrist, hand and finger(s), initial encounter: Secondary | ICD-10-CM | POA: Diagnosis not present

## 2017-12-25 DIAGNOSIS — M19032 Primary osteoarthritis, left wrist: Secondary | ICD-10-CM | POA: Diagnosis not present

## 2017-12-25 DIAGNOSIS — M25632 Stiffness of left wrist, not elsewhere classified: Secondary | ICD-10-CM | POA: Diagnosis not present

## 2017-12-25 DIAGNOSIS — M1812 Unilateral primary osteoarthritis of first carpometacarpal joint, left hand: Secondary | ICD-10-CM | POA: Diagnosis not present

## 2018-01-07 DIAGNOSIS — M1812 Unilateral primary osteoarthritis of first carpometacarpal joint, left hand: Secondary | ICD-10-CM | POA: Diagnosis not present

## 2018-01-07 DIAGNOSIS — G20 Parkinson's disease: Secondary | ICD-10-CM | POA: Diagnosis not present

## 2018-01-07 DIAGNOSIS — G249 Dystonia, unspecified: Secondary | ICD-10-CM | POA: Diagnosis not present

## 2018-01-21 DIAGNOSIS — M1812 Unilateral primary osteoarthritis of first carpometacarpal joint, left hand: Secondary | ICD-10-CM | POA: Diagnosis not present

## 2018-01-21 DIAGNOSIS — M79645 Pain in left finger(s): Secondary | ICD-10-CM | POA: Diagnosis not present

## 2018-02-06 DIAGNOSIS — M79645 Pain in left finger(s): Secondary | ICD-10-CM | POA: Diagnosis not present

## 2018-02-12 DIAGNOSIS — R2689 Other abnormalities of gait and mobility: Secondary | ICD-10-CM | POA: Diagnosis not present

## 2018-02-14 DIAGNOSIS — M79645 Pain in left finger(s): Secondary | ICD-10-CM | POA: Diagnosis not present

## 2018-02-20 DIAGNOSIS — R2689 Other abnormalities of gait and mobility: Secondary | ICD-10-CM | POA: Diagnosis not present

## 2018-02-20 DIAGNOSIS — M79645 Pain in left finger(s): Secondary | ICD-10-CM | POA: Diagnosis not present

## 2018-02-27 ENCOUNTER — Emergency Department (HOSPITAL_COMMUNITY)
Admission: EM | Admit: 2018-02-27 | Discharge: 2018-02-27 | Disposition: A | Discharge status: Home / Self Care | Payer: BLUE CROSS/BLUE SHIELD | Attending: Emergency Medicine | Admitting: Emergency Medicine

## 2018-02-27 ENCOUNTER — Emergency Department (HOSPITAL_COMMUNITY): Payer: BLUE CROSS/BLUE SHIELD

## 2018-02-27 ENCOUNTER — Encounter (HOSPITAL_COMMUNITY): Payer: Self-pay | Admitting: Emergency Medicine

## 2018-02-27 DIAGNOSIS — R079 Chest pain, unspecified: Secondary | ICD-10-CM

## 2018-02-27 DIAGNOSIS — Z79899 Other long term (current) drug therapy: Secondary | ICD-10-CM | POA: Diagnosis not present

## 2018-02-27 LAB — BASIC METABOLIC PANEL
ANION GAP: 13 (ref 5–15)
BUN: 14 mg/dL (ref 6–20)
CO2: 23 mmol/L (ref 22–32)
Calcium: 10 mg/dL (ref 8.9–10.3)
Chloride: 102 mmol/L (ref 98–111)
Creatinine, Ser: 1.01 mg/dL — ABNORMAL HIGH (ref 0.44–1.00)
GLUCOSE: 88 mg/dL (ref 70–99)
POTASSIUM: 3.2 mmol/L — AB (ref 3.5–5.1)
Sodium: 138 mmol/L (ref 135–145)

## 2018-02-27 LAB — CBC
HEMATOCRIT: 43.9 % (ref 36.0–46.0)
HEMOGLOBIN: 13.8 g/dL (ref 12.0–15.0)
MCH: 30.7 pg (ref 26.0–34.0)
MCHC: 31.4 g/dL (ref 30.0–36.0)
MCV: 97.6 fL (ref 78.0–100.0)
Platelets: 243 10*3/uL (ref 150–400)
RBC: 4.5 MIL/uL (ref 3.87–5.11)
RDW: 13.2 % (ref 11.5–15.5)
WBC: 5.4 10*3/uL (ref 4.0–10.5)

## 2018-02-27 LAB — I-STAT TROPONIN, ED
TROPONIN I, POC: 0.02 ng/mL (ref 0.00–0.08)
Troponin i, poc: 0.01 ng/mL (ref 0.00–0.08)

## 2018-02-27 LAB — I-STAT BETA HCG BLOOD, ED (MC, WL, AP ONLY): I-stat hCG, quantitative: <5 m[IU]/mL (ref <5)

## 2018-02-27 NOTE — ED Triage Notes (Signed)
Pt reports hx of PVC but never atrial fibrillation.

## 2018-02-27 NOTE — ED Provider Notes (Signed)
[facility] EMERGENCY DEPARTMENT Provider Note   CSN: [id number] Arrival date & time: 02/27/18  1806     History   Chief Complaint Chief Complaint  Patient presents with  . Chest Pain    HPI Kristina Lewis is a 52 y.o. female.  The history is provided by the patient. No language interpreter was used.  Chest Pain   This is a new problem. The problem occurs constantly. The problem has not changed since onset.   Past Medical History:  Diagnosis Date  . Anxiety   . Atypical chest pain 03/15/2017  . Bulging disc    CERVICAL  . Hemorrhoids   . IC (interstitial cystitis)    bladder stimulator 10/2012  . Left leg pain   . Palpitations 03/15/2017  . Parkinson's disease (HCC)   . PONV (postoperative nausea and vomiting)   . PVC (premature ventricular contraction) 04/04/2017  . Seasonal allergies   . Urge incontinence     Patient Active Problem List   Diagnosis Date Noted  . PVC (premature ventricular contraction) 04/04/2017  . Palpitations 03/15/2017  . Atypical chest pain 03/15/2017  . Globus sensation 05/17/2016  . Abdominal pain 05/17/2016  . First degree hemorrhoids   . Loss of weight 10/15/2014  . GERD (gastroesophageal reflux disease) 10/15/2014  . Dysphagia, pharyngoesophageal phase 10/14/2014  . Rectal bleeding 10/14/2014  . External hemorrhoid 08/01/2012  . Constipation 08/01/2012  . Dysmenorrhea 02/15/2012  . Leukorrhea 02/15/2012  . Chronic headaches 02/15/2012  . Perimenopausal vasomotor symptoms 11/28/2011  . IC (interstitial cystitis) 08/17/2011    Past Surgical History:  Procedure Laterality Date  . BIOPSY N/A 10/27/2014   Procedure: BIOPSY;  Surgeon: Corbin Ade, MD;  Location: [facility];  Service: Endoscopy;  Laterality: N/A;  duodenal, esophageal  . BREAST ENHANCEMENT SURGERY  AGE 55  . CESAREAN SECTION  12/2000  &  01-26-2003   X2  . COLONOSCOPY WITH PROPOFOL N/A 10/27/2014   Dr. Jena Gauss: Minimal internal heorrhoids;otherwise  normal rectum, colon and terminal ileum.   . CYSTO/ URETHRAL DILATION/ HOD  X2  LAST ONE 12-19-2004  . ESOPHAGEAL DILATION N/A 10/27/2014   Procedure: ESOPHAGEAL DILATION;  Surgeon: Corbin Ade, MD;  Location: [facility];  Service: Endoscopy;  Laterality: N/AElease Hashimoto 52  . ESOPHAGOGASTRODUODENOSCOPY (EGD) WITH PROPOFOL N/A 10/27/2014   Dr. Jena Gauss: Normal EGD. status post maloney dilation follwed by esophageal and duodenal biopsy. benign esophageal and duodenal biopsies  . HEMORRHOID BANDING  2016   Dr.Rourk  . INTERSTIM IMPLANT PLACEMENT N/A 10/08/2012   Procedure: [name] IMPLANT FIRST STAGE;  Surgeon: Martina Sinner, MD;  Location: [facility];  Service: Urology;  Laterality: N/A;  . INTERSTIM IMPLANT PLACEMENT N/A 10/08/2012   Procedure: [name] IMPLANT SECOND STAGE;  Surgeon: Martina Sinner, MD;  Location: [facility] [place];  Service: Urology;  Laterality: N/A;  . LASIK     bilateral  . TONSILLECTOMY  AGE 60'S     OB History    Gravida  3   Para  2   Term  2   Preterm      AB      Living  3     SAB      TAB      Ectopic      Multiple  1   Live Births  3            Home Medications    Prior to Admission medications  Medication Sig Start Date End Date Taking? Authorizing Provider  ALPRAZolam Prudy Feeler) 1 MG tablet Take by mouth at bedtime. 1 1/2 mg    [provider]  baclofen (LIORESAL) 10 MG tablet Take 10 mg by mouth 2 (two) times daily as needed.    [provider]  Carbidopa-Levodopa ER 23.75-95 MG CPCR Take 1 capsule by mouth every 3 (three) hours.     [provider]  Esomeprazole Magnesium (NEXIUM PO) Take by mouth daily.    [provider]  FLUoxetine (PROZAC) 20 MG tablet Take by mouth as directed. 2 tablet by mouth once daily    [provider]  ibuprofen (ADVIL,MOTRIN) 200 MG tablet Take 200 mg by mouth every 6 (six) hours as needed for pain.    [provider]    ranitidine (ZANTAC) 150 MG capsule Take 150 mg by mouth 2 (two) times daily.    [provider]  zonisamide (ZONEGRAN) 100 MG capsule Take 400 mg by mouth daily.     [provider]    Family History Family History  Problem Relation Age of Onset  . Diabetes Mother        Died, 31  . Diabetes Father        Living, 1  . Cancer Father        prostate  . Hypertension Father   . Atrial fibrillation Father   . CAD Father   . Hypertension Maternal Grandmother   . Heart disease Maternal Grandmother   . Diabetes Paternal Grandfather   . Breast cancer Maternal Aunt   . Cancer Cousin        MATERNAL  . Stroke Paternal Grandmother   . Colon cancer Neg Hx     Social History Social History   Tobacco Use  . Smoking status: Never Smoker  . Smokeless tobacco: Never Used  Substance Use Topics  . Alcohol use: No    Alcohol/week: 0.0 standard drinks  . Drug use: No     Allergies   Other; Tioconazole; Monistat [miconazole]; and Tape   Review of Systems Review of Systems  Cardiovascular: Positive for chest pain.  All other systems reviewed and are negative.    Physical Exam Updated Vital Signs BP 110/64   Pulse 74   Temp 98.4 F (36.9 C) (Oral)   Resp 19   Ht 5\' 3"  (1.6 m)   Wt 61.2 kg   SpO2 100%   BMI 23.91 kg/m   Physical Exam  Constitutional: She appears well-developed and well-nourished.  HENT:  Head: Normocephalic and atraumatic.  Eyes: Pupils are equal, round, and reactive to light.  Neck: Normal range of motion. Neck supple.  Cardiovascular: Normal rate, regular rhythm and normal pulses.  Pulmonary/Chest: Effort normal and breath sounds normal.  Abdominal: Soft.  Musculoskeletal:       Right lower leg: Normal.       Left lower leg: Normal.  Neurological: She is alert.  Skin: Skin is warm.  Psychiatric: She has a normal mood and affect.  Nursing note and vitals reviewed.    ED Treatments / Results  Labs (all labs ordered are  listed, but only abnormal results are displayed) Labs Reviewed  BASIC METABOLIC PANEL - Abnormal; Notable for the following components:      Result Value   Potassium 3.2 (*)    Creatinine, Ser 1.01 (*)    All other components within normal limits  CBC  I-STAT TROPONIN, ED  I-STAT BETA HCG BLOOD, ED (  [facility], [facility], [facility] ONLY)  I-STAT TROPONIN, ED    EKG EKG Interpretation  Date/Time:  Wednesday February 27 2018 19:45:36 EDT Ventricular Rate:  70 PR Interval:    QRS Duration: 97 QT Interval:  403 QTC Calculation: 435 R Axis:   87 Text Interpretation:  Sinus rhythm Confirmed by Tilden Fossaees, Elizabeth [id number]) on 02/27/2018 7:48:42 PM   Radiology Dg Chest 2 View  Result Date: 02/27/2018 CLINICAL DATA:  Chest pain and tightness ongoing for wall but started having sharp pain in the mid chest today. EXAM: CHEST - 2 VIEW COMPARISON:  09/30/2014 FINDINGS: The heart size and mediastinal contours are within normal limits. Both lungs are clear. Mild degenerative change along the midthoracic spine disc space narrowing and endplate sclerosis. IMPRESSION: No active cardiopulmonary disease. Electronically Signed   By: Tollie Ethavid  Kwon M.D.   On: 02/27/2018 20:39    Procedures Procedures (including critical care time)  Medications Ordered in ED Medications - No data to display   Initial Impression / Assessment and Plan / ED Course  I have reviewed the triage vital signs and the nursing notes.  Pertinent labs & imaging results that were available during my care of the patient were reviewed by me and considered in my medical decision making (see chart for details).     MDM Pt has 2 negative troponins. EKG no acute abnormality Pt advised to follow up with your Physician for recheck   Final Clinical Impressions(s) / ED Diagnoses   Final diagnoses:  Nonspecific chest pain    ED Discharge Orders    None    An After Visit Summary was printed and given to the patient.    Osie CheeksSofia, Dorance Spink K, PA-C 02/27/18  2252    Tilden Fossaees, Elizabeth, MD 02/28/18 [id number]

## 2018-02-27 NOTE — Discharge Instructions (Addendum)
Return if any problems.  See your Physician for recheck in 2-3 days °

## 2018-02-27 NOTE — ED Triage Notes (Signed)
Pt reports that she was driving and had a sudden onset of sharp midsternal chest pain, no radiation. Pt reports that the pain is gone right now and that she currently has a headache. States she has early on set parkinson's and feels that lately her medications haven't been as effective as they usually are.

## 2018-02-28 DIAGNOSIS — M79645 Pain in left finger(s): Secondary | ICD-10-CM | POA: Diagnosis not present

## 2018-03-05 DIAGNOSIS — M79645 Pain in left finger(s): Secondary | ICD-10-CM | POA: Diagnosis not present

## 2018-03-06 DIAGNOSIS — M1812 Unilateral primary osteoarthritis of first carpometacarpal joint, left hand: Secondary | ICD-10-CM | POA: Diagnosis not present

## 2018-03-07 DIAGNOSIS — G20 Parkinson's disease: Secondary | ICD-10-CM | POA: Diagnosis not present

## 2018-03-11 DIAGNOSIS — G20 Parkinson's disease: Secondary | ICD-10-CM | POA: Diagnosis not present

## 2018-03-12 DIAGNOSIS — G20 Parkinson's disease: Secondary | ICD-10-CM | POA: Diagnosis not present

## 2018-03-12 DIAGNOSIS — M79645 Pain in left finger(s): Secondary | ICD-10-CM | POA: Diagnosis not present

## 2018-03-18 DIAGNOSIS — G249 Dystonia, unspecified: Secondary | ICD-10-CM | POA: Diagnosis not present

## 2018-03-18 DIAGNOSIS — G20 Parkinson's disease: Secondary | ICD-10-CM | POA: Diagnosis not present

## 2018-03-18 DIAGNOSIS — F411 Generalized anxiety disorder: Secondary | ICD-10-CM | POA: Diagnosis not present

## 2018-03-19 DIAGNOSIS — G20 Parkinson's disease: Secondary | ICD-10-CM | POA: Diagnosis not present

## 2018-03-19 DIAGNOSIS — G43709 Chronic migraine without aura, not intractable, without status migrainosus: Secondary | ICD-10-CM | POA: Diagnosis not present

## 2018-03-20 DIAGNOSIS — M79645 Pain in left finger(s): Secondary | ICD-10-CM | POA: Diagnosis not present

## 2018-03-20 DIAGNOSIS — G20 Parkinson's disease: Secondary | ICD-10-CM | POA: Diagnosis not present

## 2018-03-21 DIAGNOSIS — G20 Parkinson's disease: Secondary | ICD-10-CM | POA: Diagnosis not present

## 2018-03-25 DIAGNOSIS — G20 Parkinson's disease: Secondary | ICD-10-CM | POA: Diagnosis not present

## 2018-03-26 DIAGNOSIS — G20 Parkinson's disease: Secondary | ICD-10-CM | POA: Diagnosis not present

## 2018-03-27 DIAGNOSIS — M1812 Unilateral primary osteoarthritis of first carpometacarpal joint, left hand: Secondary | ICD-10-CM | POA: Diagnosis not present

## 2018-03-27 DIAGNOSIS — Z4789 Encounter for other orthopedic aftercare: Secondary | ICD-10-CM | POA: Diagnosis not present

## 2018-03-27 DIAGNOSIS — M79645 Pain in left finger(s): Secondary | ICD-10-CM | POA: Diagnosis not present

## 2018-03-28 DIAGNOSIS — M79645 Pain in left finger(s): Secondary | ICD-10-CM | POA: Diagnosis not present

## 2018-03-29 DIAGNOSIS — G20 Parkinson's disease: Secondary | ICD-10-CM | POA: Diagnosis not present

## 2018-04-02 HISTORY — PX: OTHER SURGICAL HISTORY: SHX169

## 2018-04-03 DIAGNOSIS — G20 Parkinson's disease: Secondary | ICD-10-CM | POA: Diagnosis not present

## 2018-04-04 DIAGNOSIS — M79645 Pain in left finger(s): Secondary | ICD-10-CM | POA: Diagnosis not present

## 2018-04-09 DIAGNOSIS — G8929 Other chronic pain: Secondary | ICD-10-CM | POA: Diagnosis not present

## 2018-04-09 DIAGNOSIS — Z888 Allergy status to other drugs, medicaments and biological substances status: Secondary | ICD-10-CM | POA: Diagnosis not present

## 2018-04-09 DIAGNOSIS — K219 Gastro-esophageal reflux disease without esophagitis: Secondary | ICD-10-CM | POA: Diagnosis not present

## 2018-04-09 DIAGNOSIS — G20 Parkinson's disease: Secondary | ICD-10-CM | POA: Diagnosis not present

## 2018-04-09 DIAGNOSIS — Z883 Allergy status to other anti-infective agents status: Secondary | ICD-10-CM | POA: Diagnosis not present

## 2018-04-09 DIAGNOSIS — G9389 Other specified disorders of brain: Secondary | ICD-10-CM | POA: Diagnosis not present

## 2018-04-09 DIAGNOSIS — F40240 Claustrophobia: Secondary | ICD-10-CM | POA: Diagnosis not present

## 2018-04-09 DIAGNOSIS — F419 Anxiety disorder, unspecified: Secondary | ICD-10-CM | POA: Diagnosis not present

## 2018-04-09 DIAGNOSIS — Z91018 Allergy to other foods: Secondary | ICD-10-CM | POA: Diagnosis not present

## 2018-04-09 DIAGNOSIS — Z79899 Other long term (current) drug therapy: Secondary | ICD-10-CM | POA: Diagnosis not present

## 2018-04-10 DIAGNOSIS — Z79899 Other long term (current) drug therapy: Secondary | ICD-10-CM | POA: Diagnosis not present

## 2018-04-10 DIAGNOSIS — Z888 Allergy status to other drugs, medicaments and biological substances status: Secondary | ICD-10-CM | POA: Diagnosis not present

## 2018-04-10 DIAGNOSIS — Z91018 Allergy to other foods: Secondary | ICD-10-CM | POA: Diagnosis not present

## 2018-04-10 DIAGNOSIS — F40240 Claustrophobia: Secondary | ICD-10-CM | POA: Diagnosis not present

## 2018-04-10 DIAGNOSIS — G20 Parkinson's disease: Secondary | ICD-10-CM | POA: Diagnosis not present

## 2018-04-10 DIAGNOSIS — Z883 Allergy status to other anti-infective agents status: Secondary | ICD-10-CM | POA: Diagnosis not present

## 2018-04-10 DIAGNOSIS — K219 Gastro-esophageal reflux disease without esophagitis: Secondary | ICD-10-CM | POA: Diagnosis not present

## 2018-04-10 DIAGNOSIS — G9389 Other specified disorders of brain: Secondary | ICD-10-CM | POA: Diagnosis not present

## 2018-04-10 DIAGNOSIS — F419 Anxiety disorder, unspecified: Secondary | ICD-10-CM | POA: Diagnosis not present

## 2018-04-10 DIAGNOSIS — G8929 Other chronic pain: Secondary | ICD-10-CM | POA: Diagnosis not present

## 2018-04-11 DIAGNOSIS — G20 Parkinson's disease: Secondary | ICD-10-CM | POA: Diagnosis not present

## 2018-04-16 DIAGNOSIS — K219 Gastro-esophageal reflux disease without esophagitis: Secondary | ICD-10-CM | POA: Diagnosis not present

## 2018-04-16 DIAGNOSIS — G20 Parkinson's disease: Secondary | ICD-10-CM | POA: Diagnosis not present

## 2018-04-16 DIAGNOSIS — F419 Anxiety disorder, unspecified: Secondary | ICD-10-CM | POA: Diagnosis not present

## 2018-04-16 DIAGNOSIS — Z79899 Other long term (current) drug therapy: Secondary | ICD-10-CM | POA: Diagnosis not present

## 2018-04-16 DIAGNOSIS — G8929 Other chronic pain: Secondary | ICD-10-CM | POA: Diagnosis not present

## 2018-04-16 DIAGNOSIS — Z883 Allergy status to other anti-infective agents status: Secondary | ICD-10-CM | POA: Diagnosis not present

## 2018-04-16 DIAGNOSIS — G43909 Migraine, unspecified, not intractable, without status migrainosus: Secondary | ICD-10-CM | POA: Diagnosis not present

## 2018-04-16 DIAGNOSIS — Z888 Allergy status to other drugs, medicaments and biological substances status: Secondary | ICD-10-CM | POA: Diagnosis not present

## 2018-04-24 DIAGNOSIS — M1812 Unilateral primary osteoarthritis of first carpometacarpal joint, left hand: Secondary | ICD-10-CM | POA: Diagnosis not present

## 2018-04-24 DIAGNOSIS — M79645 Pain in left finger(s): Secondary | ICD-10-CM | POA: Diagnosis not present

## 2018-05-01 DIAGNOSIS — M79645 Pain in left finger(s): Secondary | ICD-10-CM | POA: Diagnosis not present

## 2018-05-02 DIAGNOSIS — G20 Parkinson's disease: Secondary | ICD-10-CM | POA: Diagnosis not present

## 2018-05-02 DIAGNOSIS — G249 Dystonia, unspecified: Secondary | ICD-10-CM | POA: Diagnosis not present

## 2018-05-02 DIAGNOSIS — Z9689 Presence of other specified functional implants: Secondary | ICD-10-CM | POA: Diagnosis not present

## 2018-07-25 DIAGNOSIS — G20 Parkinson's disease: Secondary | ICD-10-CM | POA: Diagnosis not present

## 2018-07-25 DIAGNOSIS — H539 Unspecified visual disturbance: Secondary | ICD-10-CM | POA: Diagnosis not present

## 2018-07-25 DIAGNOSIS — H43813 Vitreous degeneration, bilateral: Secondary | ICD-10-CM | POA: Diagnosis not present

## 2018-07-25 DIAGNOSIS — H35371 Puckering of macula, right eye: Secondary | ICD-10-CM | POA: Diagnosis not present

## 2018-07-25 DIAGNOSIS — H4312 Vitreous hemorrhage, left eye: Secondary | ICD-10-CM | POA: Diagnosis not present

## 2018-07-25 DIAGNOSIS — Z9889 Other specified postprocedural states: Secondary | ICD-10-CM | POA: Diagnosis not present

## 2018-07-25 DIAGNOSIS — Z9689 Presence of other specified functional implants: Secondary | ICD-10-CM | POA: Diagnosis not present

## 2018-08-08 ENCOUNTER — Ambulatory Visit: Payer: BLUE CROSS/BLUE SHIELD | Admitting: Gastroenterology

## 2018-08-09 ENCOUNTER — Telehealth: Payer: Self-pay | Admitting: Gastroenterology

## 2018-08-09 NOTE — Telephone Encounter (Signed)
Durward Mallard, can you open a slot for me on Feb 13th at 11 am for [place]? Misty Stanley, she can come this day, and she is aware of appointment.

## 2018-08-09 NOTE — Telephone Encounter (Signed)
Appointment slot has been opened.  

## 2018-08-12 NOTE — Telephone Encounter (Signed)
PATIENT SCHEDULED  °

## 2018-08-13 ENCOUNTER — Ambulatory Visit: Payer: BLUE CROSS/BLUE SHIELD | Admitting: Gastroenterology

## 2018-08-13 ENCOUNTER — Encounter: Payer: Self-pay | Admitting: Gastroenterology

## 2018-08-13 VITALS — BP 106/62 | HR 76 | Temp 96.9°F | Ht 63.0 in | Wt 142.4 lb

## 2018-08-13 DIAGNOSIS — K5900 Constipation, unspecified: Secondary | ICD-10-CM

## 2018-08-13 DIAGNOSIS — R1013 Epigastric pain: Secondary | ICD-10-CM

## 2018-08-13 MED ORDER — ESOMEPRAZOLE MAGNESIUM 40 MG PO CPDR: 40.0000 mg | DELAYED_RELEASE_CAPSULE | Freq: Every day | ORAL | 3 refills | Status: DC

## 2018-08-13 NOTE — Patient Instructions (Signed)
I increased the dosage of Nexium to 40 milligrams daily and sent to [facility].  I would like for you to try Linzess 290 microgram capsule once each morning, 30 minutes before breakfast. It works best on an empty stomach to reduce diarrhea. You may have some loose stool for a few days until you get evened out. If continued loose stool, drop down to the 145 microgram dosage. There is also a smaller dosage than that, but I feel one of these samples will work best.   There are other options we can try as well, so keep in touch!! Also, if you have persistent bloating and concerning symptoms, we will order a CT.  I will see you in 2 months regardless!  It was a pleasure to see you today. I strive to create trusting relationships with patients to provide genuine, compassionate, and quality care. I value your feedback. If you receive a survey regarding your visit,  I greatly appreciate you taking time to fill this out.   Gelene Mink, PhD, ANP-BC [facility]

## 2018-08-13 NOTE — Progress Notes (Signed)
Referring Provider: Samuella Lewis Primary Care Physician:  Kristina Dapper, PA-C  Primary GI: Dr. Jena Gauss   Chief Complaint  Patient presents with  . Bloated  . Diarrhea  . Gas    HPI:   Kristina Lewis is a 53 y.o. female presenting today with a history of GERD, dysphagia, Parkinson's disease diagnosed in 2015, followed by [facility]. Last EGD 2016 with patent esophagus s/p empiric dilatation. Last colonoscopy 2016 with internal hemorrhoids otherwise normal. She contacted me last week with concerns for worsening constipation.   In interim from last visit, she has undergone cranial neurostimulator implantation Oct 2019 at [facility]. Feels bloated. Lots of gas. Feels like it is difficult to empty. Feels something moving in upper abdomen and rolling. Last few days has had watery diarrhea in setting of laxatives. Still feels not emptying completely and bloating. Burping. On Nexium 20 mg OTC.    Past Medical History:  Diagnosis Date  . Anxiety   . Atypical chest pain 03/15/2017  . Bulging disc    CERVICAL  . Hemorrhoids   . IC (interstitial cystitis)    bladder stimulator 10/2012  . Left leg pain   . Palpitations 03/15/2017  . Parkinson's disease (HCC)   . PONV (postoperative nausea and vomiting)   . PVC (premature ventricular contraction) 04/04/2017  . Seasonal allergies   . Urge incontinence     Past Surgical History:  Procedure Laterality Date  . BIOPSY N/A 10/27/2014   Procedure: BIOPSY;  Surgeon: Corbin Ade, MD;  Location: [facility];  Service: Endoscopy;  Laterality: N/A;  duodenal, esophageal  . BREAST ENHANCEMENT SURGERY  AGE 83  . CESAREAN SECTION  12/2000  &  01-26-2003   X2  . COLONOSCOPY WITH PROPOFOL N/A 10/27/2014   Dr. Jena Gauss: Minimal internal heorrhoids;otherwise normal rectum, colon and terminal ileum.   . cranial neurostimulator implant  04/2018   for deep brain simulation. [facility]  . CYSTO/ URETHRAL DILATION/ HOD  X2  LAST ONE 12-19-2004  . ESOPHAGEAL DILATION N/A  10/27/2014   Procedure: ESOPHAGEAL DILATION;  Surgeon: Corbin Ade, MD;  Location: [facility];  Service: Endoscopy;  Laterality: N/AElease Hashimoto 53  . ESOPHAGOGASTRODUODENOSCOPY (EGD) WITH PROPOFOL N/A 10/27/2014   Dr. Jena Gauss: Normal EGD. status post maloney dilation follwed by esophageal and duodenal biopsy. benign esophageal and duodenal biopsies  . HEMORRHOID BANDING  2016   Dr.Rourk  . INTERSTIM IMPLANT PLACEMENT N/A 10/08/2012   Procedure: [name] IMPLANT FIRST STAGE;  Surgeon: Martina Sinner, MD;  Location[facility];  Service: Urology;  Laterality: N/A;  . INTERSTIM IMPLANT PLACEMENT N/A 10/08/2012   Procedure: [name] IMPLANT SECOND STAGE;  Surgeon: Martina Sinner, MD;  Location: [facility];  Service: Urology;  Laterality: N/A;  . LASIK     bilateral  . TONSILLECTOMY  AGE 32'S    Current Outpatient Medications  Medication Sig Dispense Refill  . ALPRAZolam (XANAX) 1 MG tablet Take 1.5 mg by mouth at bedtime.     . baclofen (LIORESAL) 10 MG tablet Take 10 mg by mouth 2 (two) times daily as needed for muscle spasms.     . Cholecalciferol (VITAMIN D PO) Take 1 tablet by mouth.     Marland Kitchen FLUoxetine (PROZAC) 20 MG tablet Take 40 mg by mouth daily. 2 tablet by mouth once daily    . ibuprofen (ADVIL,MOTRIN) 200 MG tablet Take 800 mg by mouth every 6 (six) hours as needed for pain.     Marland Kitchen  Melatonin 3 MG TABS Take 3 mg by mouth at bedtime.    Marland Kitchen. zonisamide (ZONEGRAN) 100 MG capsule Take 400 mg by mouth at bedtime.     . Carbidopa-Levodopa ER 23.75-95 MG CPCR Take 1 capsule by mouth every 3 (three) hours.     Marland Kitchen. esomeprazole (NEXIUM) 40 MG capsule Take 1 capsule (40 mg total) by mouth daily before breakfast. 90 capsule 3  . ranitidine (ZANTAC) 150 MG capsule Take 150 mg by mouth 2 (two) times daily as needed.      No current facility-administered medications for this visit.     Allergies as of 08/13/2018 - Review Complete 08/13/2018  Allergen Reaction Noted    . Other Diarrhea and Swelling 05/18/2014  . Tioconazole Itching and Swelling 12/16/2014  . Monistat [miconazole] Swelling 11/28/2011  . Tape Itching 10/04/2012    Family History  Problem Relation Age of Onset  . Diabetes Mother        Died, 3775  . Diabetes Father        Living, 8681  . Cancer Father        prostate  . Hypertension Father   . Atrial fibrillation Father   . CAD Father   . Hypertension Maternal Grandmother   . Heart disease Maternal Grandmother   . Diabetes Paternal Grandfather   . Breast cancer Maternal Aunt   . Cancer Cousin        MATERNAL  . Stroke Paternal Grandmother   . Colon cancer Neg Hx     Social History   Socioeconomic History  . Marital status: Married    Spouse name: Not on file  . Number of children: 3  . Years of education: Not on file  . Highest education level: Not on file  Occupational History  . Not on file  Social Needs  . Financial resource strain: Not on file  . Food insecurity:    Worry: Not on file    Inability: Not on file  . Transportation needs:    Medical: Not on file    Non-medical: Not on file  Tobacco Use  . Smoking status: Never Smoker  . Smokeless tobacco: Never Used  Substance and Sexual Activity  . Alcohol use: No    Alcohol/week: 0.0 standard drinks  . Drug use: No  . Sexual activity: Not on file  Lifestyle  . Physical activity:    Days per week: Not on file    Minutes per session: Not on file  . Stress: Not on file  Relationships  . Social connections:    Talks on phone: Not on file    Gets together: Not on file    Attends religious service: Not on file    Active member of club or organization: Not on file    Attends meetings of clubs or organizations: Not on file    Relationship status: Not on file  Other Topics Concern  . 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  She had three healthy children at home.  Husband is Programmer, multimediapreacher.      Review of Systems: Gen: Denies fever, chills,  anorexia. Denies fatigue, weakness, weight loss.  CV: Denies chest pain, palpitations, syncope, peripheral edema, and claudication. Resp: Denies dyspnea at rest, cough, wheezing, coughing up blood, and pleurisy. GI: see HPI Derm: Denies rash, itching, dry skin Psych: Denies depression, anxiety, memory loss, confusion. No homicidal or suicidal ideation.  Heme: Denies bruising, bleeding, and enlarged lymph nodes.  Physical Exam: BP  106/62   Pulse 76   Temp (!) 96.9 F (36.1 C) (Oral)   Ht 5\' 3"  (1.6 m)   Wt 142 lb 6.4 oz (64.6 kg)   BMI 25.23 kg/m  General:   Alert and oriented. No distress noted. Pleasant and cooperative.  Head:  Normocephalic and atraumatic. Eyes:  Conjuctiva clear without scleral icterus. Mouth:  Oral mucosa pink and moist.  Abdomen:  +BS, soft, non-tender and non-distended. No rebound or guarding. No HSM or masses noted. Msk:  Symmetrical without gross deformities. Normal posture. Extremities:  Without edema. Neurologic:  Alert and  oriented x4 Psych:  Alert and cooperative. Normal mood and affect.

## 2018-08-15 ENCOUNTER — Ambulatory Visit: Payer: BLUE CROSS/BLUE SHIELD | Admitting: Gastroenterology

## 2018-08-19 NOTE — Progress Notes (Signed)
CC'D TO PCP °

## 2018-08-19 NOTE — Assessment & Plan Note (Signed)
Currently taking OTC Nexium. Increase to 40 mg daily. Prescription sent to pharmacy.

## 2018-08-19 NOTE — Assessment & Plan Note (Signed)
Likely multifactorial in setting of medication, known Parkinson's. No improvement with laxatives OTC. Will trial Linzess 290 mcg once daily first. If this is too strong, will decrease to Linzess 145 mcg daily. Both samples provided. I have decided to start with Linzess first as she does note some nausea, and Amitiza could potentially have side effect of nausea if not taken with food; she feels she would do better with once daily dosing, which I agree. Could also consider [employer]grity. If she continues to have bloating and vague abdominal complaints despite maximizing bowel regimen, would favor imaging. Return in 2 months regardless.

## 2018-08-21 ENCOUNTER — Telehealth: Payer: Self-pay | Admitting: Gastroenterology

## 2018-08-21 NOTE — Telephone Encounter (Signed)
Patient is not doing well with Linzess that was started at last office visit due to diarrhea.  Please leave Amitiza 8 mcg samples at desk for her. She will be coming by today. Thanks!

## 2018-08-21 NOTE — Telephone Encounter (Signed)
Noted. Samples are ready for pickup.  

## 2018-08-29 DIAGNOSIS — Z6823 Body mass index (BMI) 23.0-23.9, adult: Secondary | ICD-10-CM | POA: Diagnosis not present

## 2018-08-29 DIAGNOSIS — J019 Acute sinusitis, unspecified: Secondary | ICD-10-CM | POA: Diagnosis not present

## 2018-08-29 DIAGNOSIS — J309 Allergic rhinitis, unspecified: Secondary | ICD-10-CM | POA: Diagnosis not present

## 2018-09-02 DIAGNOSIS — N952 Postmenopausal atrophic vaginitis: Secondary | ICD-10-CM | POA: Diagnosis not present

## 2018-09-09 ENCOUNTER — Telehealth: Payer: Self-pay | Admitting: Gastroenterology

## 2018-09-09 DIAGNOSIS — R140 Abdominal distension (gaseous): Secondary | ICD-10-CM

## 2018-09-09 DIAGNOSIS — R110 Nausea: Secondary | ICD-10-CM

## 2018-09-09 MED ORDER — LUBIPROSTONE 24 MCG PO CAPS: 24.0000 ug | ORAL_CAPSULE | Freq: Two times a day (BID) | ORAL | 3 refills | Status: DC

## 2018-09-09 NOTE — Telephone Encounter (Signed)
GES scheduled for 3/11 at 10:00am, npo after midnight, no stomach meds after midnight.  Patient aware of appt details. Also aware rx sent in below as stated by AB

## 2018-09-09 NOTE — Telephone Encounter (Signed)
Patient stating stool is still not productive with Amitiza 8 mcg. Feels bloated, nauseated at times. Worried about gastroparesis. Discussed constipation could also be playing a role, but we will pursue GES due to her symptoms and medical history.    [facility] clinical pool: please arrange GES.   I have sent in Amitiza 24 mcg po BID with food.

## 2018-09-11 ENCOUNTER — Ambulatory Visit (HOSPITAL_COMMUNITY)
Admission: RE | Admit: 2018-09-11 | Discharge: 2018-09-11 | Disposition: A | Discharge status: Home / Self Care | Payer: BLUE CROSS/BLUE SHIELD | Source: Ambulatory Visit | Attending: Gastroenterology | Admitting: Gastroenterology

## 2018-09-11 ENCOUNTER — Other Ambulatory Visit: Payer: Self-pay

## 2018-09-11 ENCOUNTER — Encounter (HOSPITAL_COMMUNITY): Payer: Self-pay

## 2018-09-11 DIAGNOSIS — R140 Abdominal distension (gaseous): Secondary | ICD-10-CM

## 2018-09-11 DIAGNOSIS — R6881 Early satiety: Secondary | ICD-10-CM | POA: Diagnosis not present

## 2018-09-11 DIAGNOSIS — R110 Nausea: Secondary | ICD-10-CM | POA: Insufficient documentation

## 2018-09-11 MED ORDER — TECHNETIUM TC 99M SULFUR COLLOID
2.0000 | Freq: Once | INTRAVENOUS | Status: AC | PRN
  Administered 2018-09-11: 2.08 via ORAL

## 2018-09-12 ENCOUNTER — Telehealth: Payer: Self-pay | Admitting: Internal Medicine

## 2018-09-12 NOTE — Progress Notes (Signed)
Alica: her gastric emptying study is normal. How is she doing with Amitiza 24 mcg BID with food? If she continues to have bloating and concerning symptoms, we had talked about a CT scan.

## 2018-09-12 NOTE — Telephone Encounter (Signed)
Pt had a GES done yesterday and was calling to get the results. I told her it normally takes 7-10 business days for the results to become available, but she was told different at the hospital and was told they would be available today. Please call her at [phone]

## 2018-09-12 NOTE — Telephone Encounter (Signed)
Spoke with pt. She was notified that we will contact her with results once her provider signs off on it. Pt was ok with waiting until provider signs off on it.

## 2018-10-22 ENCOUNTER — Ambulatory Visit: Payer: BLUE CROSS/BLUE SHIELD | Admitting: Gastroenterology

## 2018-10-22 ENCOUNTER — Other Ambulatory Visit: Payer: Self-pay

## 2018-10-24 DIAGNOSIS — H9313 Tinnitus, bilateral: Secondary | ICD-10-CM | POA: Diagnosis not present

## 2018-10-24 DIAGNOSIS — G20 Parkinson's disease: Secondary | ICD-10-CM | POA: Diagnosis not present

## 2018-12-02 DIAGNOSIS — H5211 Myopia, right eye: Secondary | ICD-10-CM | POA: Diagnosis not present

## 2018-12-22 DIAGNOSIS — H6092 Unspecified otitis externa, left ear: Secondary | ICD-10-CM | POA: Diagnosis not present

## 2018-12-22 DIAGNOSIS — J029 Acute pharyngitis, unspecified: Secondary | ICD-10-CM | POA: Diagnosis not present

## 2018-12-26 DIAGNOSIS — G43709 Chronic migraine without aura, not intractable, without status migrainosus: Secondary | ICD-10-CM | POA: Diagnosis not present

## 2019-01-13 ENCOUNTER — Telehealth: Payer: Self-pay | Admitting: Gastroenterology

## 2019-01-13 DIAGNOSIS — R1084 Generalized abdominal pain: Secondary | ICD-10-CM

## 2019-01-13 DIAGNOSIS — R140 Abdominal distension (gaseous): Secondary | ICD-10-CM

## 2019-01-13 NOTE — Telephone Encounter (Signed)
Called [employer] for PA of CT and spoke Cleta Alberts. Unable to complete request as [employer] can't confirm [facility] being in network nor [facility]. Unable to manually enter facility. She advised for me to call [employer] directly: [phone].

## 2019-01-13 NOTE — Telephone Encounter (Signed)
Contacted by patient that she is having persisting bloating, abdominal fullness, distension. Previously have tried to maximize bowel regimen but unsuccessful. Needs CT abd/pelvis with contrast as persistent symptoms are concerning. Please have completed this week if at all possible.

## 2019-01-14 NOTE — Telephone Encounter (Signed)
Called [phone] and spoke to Ranae Palms., unable to complete PA request. [facility], [facility], and [facility] not listed as in network.   Per [employer], CPT code 219 [id number] does not require PA.  CT abd/pelvis w/contrast scheduled for 01/15/19 at 12:00pm, arrive at 11:45am. NPO 4 hours prior to prior to test. Pick up contrast prior to scan. Tried to call pt, no answer, LMOVM for return call.

## 2019-01-14 NOTE — Telephone Encounter (Signed)
Called and informed pt of CT appt.  

## 2019-01-15 ENCOUNTER — Ambulatory Visit (HOSPITAL_COMMUNITY): Admission: RE | Admit: 2019-01-15 | Payer: BC Managed Care – PPO | Source: Ambulatory Visit

## 2019-01-31 ENCOUNTER — Ambulatory Visit (HOSPITAL_COMMUNITY): Admission: RE | Admit: 2019-01-31 | Payer: BC Managed Care – PPO | Source: Ambulatory Visit

## 2019-03-11 ENCOUNTER — Other Ambulatory Visit: Payer: Self-pay

## 2019-03-11 DIAGNOSIS — R6889 Other general symptoms and signs: Secondary | ICD-10-CM | POA: Diagnosis not present

## 2019-03-11 DIAGNOSIS — Z20822 Contact with and (suspected) exposure to covid-19: Secondary | ICD-10-CM

## 2019-03-12 LAB — NOVEL CORONAVIRUS, NAA: SARS-CoV-2, NAA: NOT DETECTED

## 2019-04-08 DIAGNOSIS — Z23 Encounter for immunization: Secondary | ICD-10-CM | POA: Diagnosis not present

## 2019-04-22 DIAGNOSIS — R350 Frequency of micturition: Secondary | ICD-10-CM | POA: Diagnosis not present

## 2019-04-22 DIAGNOSIS — N3010 Interstitial cystitis (chronic) without hematuria: Secondary | ICD-10-CM | POA: Diagnosis not present

## 2019-05-15 DIAGNOSIS — R109 Unspecified abdominal pain: Secondary | ICD-10-CM | POA: Diagnosis not present

## 2019-05-15 DIAGNOSIS — N3010 Interstitial cystitis (chronic) without hematuria: Secondary | ICD-10-CM | POA: Diagnosis not present

## 2019-06-06 DIAGNOSIS — T85193A Other mechanical complication of implanted electronic neurostimulator, generator, initial encounter: Secondary | ICD-10-CM | POA: Diagnosis not present

## 2019-06-06 DIAGNOSIS — N3941 Urge incontinence: Secondary | ICD-10-CM | POA: Diagnosis not present

## 2019-06-06 DIAGNOSIS — T85191A Other mechanical complication of implanted electronic neurostimulator (electrode) of peripheral nerve, initial encounter: Secondary | ICD-10-CM | POA: Diagnosis not present

## 2019-06-17 DIAGNOSIS — R350 Frequency of micturition: Secondary | ICD-10-CM | POA: Diagnosis not present

## 2019-06-17 DIAGNOSIS — N3010 Interstitial cystitis (chronic) without hematuria: Secondary | ICD-10-CM | POA: Diagnosis not present

## 2019-07-01 DIAGNOSIS — N3010 Interstitial cystitis (chronic) without hematuria: Secondary | ICD-10-CM | POA: Diagnosis not present

## 2019-07-05 IMAGING — NM NUCLEAR MEDICINE GASTRIC EMPTYING STUDY
8 series · 8 of 8 positions shown · non-contrast
Comparison: None.

CLINICAL DATA: Early satiety and nausea

EXAM:
NUCLEAR MEDICINE GASTRIC EMPTYING SCAN
TECHNIQUE: After oral ingestion of radiolabeled meal, sequential abdominal
images were obtained for 3 hours. Percentage of activity emptying
the stomach was calculated at 1 hour, 2 hours, and 3 hours.
RADIOPHARMACEUTICALS:  2.08 mCi Cc-GGm sulfur colloid in
standardized meal including egg

[Series 1: 0 min · 4.14mm/px · 1 of 1 slices shown (1 of 2)]
[im 1/1]
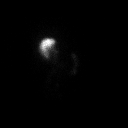

[Series 1: 0 min · 4.14mm/px · 1 of 1 slices shown (2 of 2)]
[im 1/1]
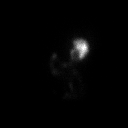

[Series 2: 60 min · 4.14mm/px · 1 of 1 slices shown (1 of 2)]
[im 1/1]
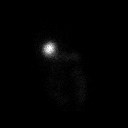

[Series 2: 60 min · 4.14mm/px · 1 of 1 slices shown (2 of 2)]
[im 1/1]
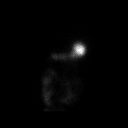

[Series 3: 120 min · 4.14mm/px · 1 of 1 slices shown (1 of 2)]
[im 1/1]
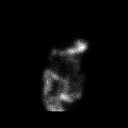

[Series 3: 120 min · 4.14mm/px · 1 of 1 slices shown (2 of 2)]
[im 1/1]
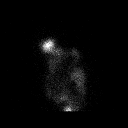

[Series 4: 180 min · 4.14mm/px · 1 of 1 slices shown (1 of 2)]
[im 1/1]
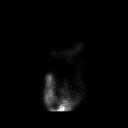

[Series 4: 180 min · 4.14mm/px · 1 of 1 slices shown (2 of 2)]
[im 1/1]
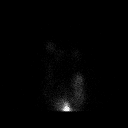

[8 of 8 positions shown; findings below may reference images not displayed]

FINDINGS: Expected location of the stomach in the left upper quadrant.
Ingested meal empties the stomach gradually over the course of the
study.

26.7% emptied at 1 hr ( normal >= 10%)

68.6% emptied at 2 hr ( normal >= 40%)

90.8% emptied at 3 hr ( normal >= 70%)

Normal greater than 90% emptying after 4 hours post radiotracer
administration.
IMPRESSION: Normal gastric emptying study.

## 2019-07-16 ENCOUNTER — Other Ambulatory Visit: Payer: Self-pay | Admitting: Gastroenterology

## 2019-07-18 DIAGNOSIS — Z6822 Body mass index (BMI) 22.0-22.9, adult: Secondary | ICD-10-CM | POA: Diagnosis not present

## 2019-07-18 DIAGNOSIS — Z01419 Encounter for gynecological examination (general) (routine) without abnormal findings: Secondary | ICD-10-CM | POA: Diagnosis not present

## 2019-08-12 ENCOUNTER — Observation Stay (HOSPITAL_COMMUNITY)
Admission: EM | Admit: 2019-08-12 | Discharge: 2019-08-13 | Disposition: A | Discharge status: Home / Self Care | Payer: BC Managed Care – PPO | Attending: Physician Assistant | Admitting: Physician Assistant

## 2019-08-12 ENCOUNTER — Encounter (HOSPITAL_COMMUNITY): Payer: Self-pay | Admitting: Anesthesiology

## 2019-08-12 ENCOUNTER — Encounter (HOSPITAL_COMMUNITY)
Admission: EM | Disposition: A | Discharge status: Home / Self Care | Payer: Self-pay | Source: Home / Self Care | Attending: Emergency Medicine

## 2019-08-12 ENCOUNTER — Emergency Department (HOSPITAL_COMMUNITY): Payer: BC Managed Care – PPO

## 2019-08-12 ENCOUNTER — Other Ambulatory Visit: Payer: Self-pay

## 2019-08-12 ENCOUNTER — Encounter (HOSPITAL_COMMUNITY): Payer: Self-pay | Admitting: Emergency Medicine

## 2019-08-12 DIAGNOSIS — Z833 Family history of diabetes mellitus: Secondary | ICD-10-CM | POA: Insufficient documentation

## 2019-08-12 DIAGNOSIS — I493 Ventricular premature depolarization: Secondary | ICD-10-CM | POA: Insufficient documentation

## 2019-08-12 DIAGNOSIS — Z8249 Family history of ischemic heart disease and other diseases of the circulatory system: Secondary | ICD-10-CM | POA: Diagnosis not present

## 2019-08-12 DIAGNOSIS — Z791 Long term (current) use of non-steroidal anti-inflammatories (NSAID): Secondary | ICD-10-CM | POA: Diagnosis not present

## 2019-08-12 DIAGNOSIS — R0789 Other chest pain: Secondary | ICD-10-CM | POA: Insufficient documentation

## 2019-08-12 DIAGNOSIS — R079 Chest pain, unspecified: Secondary | ICD-10-CM | POA: Diagnosis not present

## 2019-08-12 DIAGNOSIS — N946 Dysmenorrhea, unspecified: Secondary | ICD-10-CM | POA: Insufficient documentation

## 2019-08-12 DIAGNOSIS — R519 Headache, unspecified: Secondary | ICD-10-CM | POA: Insufficient documentation

## 2019-08-12 DIAGNOSIS — K5900 Constipation, unspecified: Secondary | ICD-10-CM | POA: Diagnosis not present

## 2019-08-12 DIAGNOSIS — R002 Palpitations: Secondary | ICD-10-CM | POA: Diagnosis not present

## 2019-08-12 DIAGNOSIS — T8189XA Other complications of procedures, not elsewhere classified, initial encounter: Secondary | ICD-10-CM | POA: Diagnosis not present

## 2019-08-12 DIAGNOSIS — Z20822 Contact with and (suspected) exposure to covid-19: Secondary | ICD-10-CM | POA: Diagnosis not present

## 2019-08-12 DIAGNOSIS — Z79899 Other long term (current) drug therapy: Secondary | ICD-10-CM | POA: Insufficient documentation

## 2019-08-12 DIAGNOSIS — Z803 Family history of malignant neoplasm of breast: Secondary | ICD-10-CM | POA: Insufficient documentation

## 2019-08-12 DIAGNOSIS — Z883 Allergy status to other anti-infective agents status: Secondary | ICD-10-CM | POA: Insufficient documentation

## 2019-08-12 DIAGNOSIS — G20 Parkinson's disease: Secondary | ICD-10-CM | POA: Insufficient documentation

## 2019-08-12 DIAGNOSIS — N898 Other specified noninflammatory disorders of vagina: Secondary | ICD-10-CM | POA: Insufficient documentation

## 2019-08-12 DIAGNOSIS — Z91048 Other nonmedicinal substance allergy status: Secondary | ICD-10-CM | POA: Insufficient documentation

## 2019-08-12 DIAGNOSIS — Z91018 Allergy to other foods: Secondary | ICD-10-CM | POA: Insufficient documentation

## 2019-08-12 DIAGNOSIS — K668 Other specified disorders of peritoneum: Secondary | ICD-10-CM | POA: Diagnosis not present

## 2019-08-12 DIAGNOSIS — X58XXXA Exposure to other specified factors, initial encounter: Secondary | ICD-10-CM | POA: Insufficient documentation

## 2019-08-12 DIAGNOSIS — Z809 Family history of malignant neoplasm, unspecified: Secondary | ICD-10-CM | POA: Insufficient documentation

## 2019-08-12 DIAGNOSIS — K644 Residual hemorrhoidal skin tags: Secondary | ICD-10-CM | POA: Diagnosis not present

## 2019-08-12 DIAGNOSIS — N3010 Interstitial cystitis (chronic) without hematuria: Secondary | ICD-10-CM | POA: Insufficient documentation

## 2019-08-12 DIAGNOSIS — I319 Disease of pericardium, unspecified: Secondary | ICD-10-CM | POA: Diagnosis not present

## 2019-08-12 DIAGNOSIS — F419 Anxiety disorder, unspecified: Secondary | ICD-10-CM | POA: Insufficient documentation

## 2019-08-12 DIAGNOSIS — Z888 Allergy status to other drugs, medicaments and biological substances status: Secondary | ICD-10-CM | POA: Diagnosis not present

## 2019-08-12 DIAGNOSIS — Z823 Family history of stroke: Secondary | ICD-10-CM | POA: Insufficient documentation

## 2019-08-12 DIAGNOSIS — K219 Gastro-esophageal reflux disease without esophagitis: Secondary | ICD-10-CM | POA: Insufficient documentation

## 2019-08-12 DIAGNOSIS — N3941 Urge incontinence: Secondary | ICD-10-CM | POA: Diagnosis not present

## 2019-08-12 DIAGNOSIS — R1084 Generalized abdominal pain: Secondary | ICD-10-CM | POA: Diagnosis not present

## 2019-08-12 DIAGNOSIS — R1013 Epigastric pain: Secondary | ICD-10-CM | POA: Diagnosis not present

## 2019-08-12 LAB — URINALYSIS, ROUTINE W REFLEX MICROSCOPIC
Bilirubin Urine: NEGATIVE
Glucose, UA: NEGATIVE mg/dL
Hgb urine dipstick: NEGATIVE
Ketones, ur: 5 mg/dL — AB
Leukocytes,Ua: NEGATIVE
Nitrite: NEGATIVE
Protein, ur: NEGATIVE mg/dL
Specific Gravity, Urine: 1.008 (ref 1.005–1.030)
pH: 6 (ref 5.0–8.0)

## 2019-08-12 LAB — BASIC METABOLIC PANEL
Anion gap: 12 (ref 5–15)
BUN: 19 mg/dL (ref 6–20)
CO2: 21 mmol/L — ABNORMAL LOW (ref 22–32)
Calcium: 9.3 mg/dL (ref 8.9–10.3)
Chloride: 104 mmol/L (ref 98–111)
Creatinine, Ser: 1.05 mg/dL — ABNORMAL HIGH (ref 0.44–1.00)
GFR calc Af Amer: >60 mL/min (ref >60)
GFR calc non Af Amer: >60 mL/min (ref >60)
Glucose, Bld: 95 mg/dL (ref 70–99)
Potassium: 3.7 mmol/L (ref 3.5–5.1)
Sodium: 137 mmol/L (ref 135–145)

## 2019-08-12 LAB — TROPONIN I (HIGH SENSITIVITY)
Troponin I (High Sensitivity): 6 ng/L (ref <18)
Troponin I (High Sensitivity): 3 ng/L (ref <18)

## 2019-08-12 LAB — HEPATIC FUNCTION PANEL
ALT: 28 U/L (ref 0–44)
AST: 28 U/L (ref 15–41)
Albumin: 4.2 g/dL (ref 3.5–5.0)
Alkaline Phosphatase: 101 U/L (ref 38–126)
Bilirubin, Direct: <0.1 mg/dL (ref 0.0–0.2)
Total Bilirubin: 0.5 mg/dL (ref 0.3–1.2)
Total Protein: 7 g/dL (ref 6.5–8.1)

## 2019-08-12 LAB — I-STAT BETA HCG BLOOD, ED (MC, WL, AP ONLY): I-stat hCG, quantitative: <5 m[IU]/mL (ref <5)

## 2019-08-12 LAB — RESPIRATORY PANEL BY RT PCR (FLU A&B, COVID)
Influenza A by PCR: NEGATIVE
Influenza B by PCR: NEGATIVE
SARS Coronavirus 2 by RT PCR: NEGATIVE

## 2019-08-12 LAB — CBC
HCT: 44.8 % (ref 36.0–46.0)
Hemoglobin: 14.4 g/dL (ref 12.0–15.0)
MCH: 30.6 pg (ref 26.0–34.0)
MCHC: 32.1 g/dL (ref 30.0–36.0)
MCV: 95.3 fL (ref 80.0–100.0)
Platelets: 257 10*3/uL (ref 150–400)
RBC: 4.7 MIL/uL (ref 3.87–5.11)
RDW: 13.2 % (ref 11.5–15.5)
WBC: 5.4 10*3/uL (ref 4.0–10.5)
nRBC: 0 % (ref 0.0–0.2)

## 2019-08-12 SURGERY — CREATION, TRACHEOSTOMY
Anesthesia: General

## 2019-08-12 SURGERY — LAPAROTOMY, EXPLORATORY
Anesthesia: General

## 2019-08-12 MED ORDER — MORPHINE SULFATE (PF) 2 MG/ML IV SOLN
1.0000 mg | INTRAVENOUS | Status: DC | PRN
  Administered 2019-08-12: 1 mg via INTRAVENOUS
  Filled 2019-08-12: qty 1

## 2019-08-12 MED ORDER — SODIUM CHLORIDE 0.9 % IV SOLN
INTRAVENOUS | Status: DC
  Filled 2019-08-12: qty 1000

## 2019-08-12 MED ORDER — ACETAMINOPHEN 650 MG RE SUPP: 650.0000 mg | Freq: Four times a day (QID) | RECTAL | Status: DC | PRN

## 2019-08-12 MED ORDER — SODIUM CHLORIDE 0.9 % IV SOLN: INTRAVENOUS | Status: DC

## 2019-08-12 MED ORDER — MIDAZOLAM HCL 2 MG/2ML IJ SOLN
INTRAMUSCULAR | Status: AC
  Filled 2019-08-12: qty 2

## 2019-08-12 MED ORDER — PROPOFOL 10 MG/ML IV BOLUS
INTRAVENOUS | Status: AC
  Filled 2019-08-12: qty 20

## 2019-08-12 MED ORDER — ONDANSETRON HCL 4 MG/2ML IJ SOLN: 4.0000 mg | Freq: Four times a day (QID) | INTRAMUSCULAR | Status: DC | PRN

## 2019-08-12 MED ORDER — MORPHINE SULFATE (PF) 2 MG/ML IV SOLN: 2.0000 mg | INTRAVENOUS | Status: DC | PRN

## 2019-08-12 MED ORDER — FENTANYL CITRATE (PF) 250 MCG/5ML IJ SOLN
INTRAMUSCULAR | Status: AC
  Filled 2019-08-12: qty 5

## 2019-08-12 MED ORDER — PHENYLEPHRINE 40 MCG/ML (10ML) SYRINGE FOR IV PUSH (FOR BLOOD PRESSURE SUPPORT)
PREFILLED_SYRINGE | INTRAVENOUS | Status: AC
  Filled 2019-08-12: qty 10

## 2019-08-12 MED ORDER — PIPERACILLIN-TAZOBACTAM 3.375 G IVPB
3.3750 g | Freq: Three times a day (TID) | INTRAVENOUS | Status: DC
  Administered 2019-08-12 – 2019-08-13 (×2): 3.375 g via INTRAVENOUS
  Filled 2019-08-12 (×4): qty 50

## 2019-08-12 MED ORDER — CHLORHEXIDINE GLUCONATE CLOTH 2 % EX PADS: 6.0000 | MEDICATED_PAD | Freq: Once | CUTANEOUS | Status: DC

## 2019-08-12 MED ORDER — 0.9 % SODIUM CHLORIDE (POUR BTL) OPTIME: TOPICAL | Status: DC | PRN

## 2019-08-12 MED ORDER — LIDOCAINE 2% (20 MG/ML) 5 ML SYRINGE
INTRAMUSCULAR | Status: AC
  Filled 2019-08-12: qty 5

## 2019-08-12 MED ORDER — ONDANSETRON 4 MG PO TBDP: 4.0000 mg | ORAL_TABLET | Freq: Four times a day (QID) | ORAL | Status: DC | PRN

## 2019-08-12 MED ORDER — ROCURONIUM BROMIDE 10 MG/ML (PF) SYRINGE
PREFILLED_SYRINGE | INTRAVENOUS | Status: AC
  Filled 2019-08-12: qty 10

## 2019-08-12 MED ORDER — ONDANSETRON HCL 4 MG/2ML IJ SOLN
INTRAMUSCULAR | Status: AC
  Filled 2019-08-12: qty 2

## 2019-08-12 MED ORDER — CHLORHEXIDINE GLUCONATE CLOTH 2 % EX PADS
6.0000 | MEDICATED_PAD | Freq: Once | CUTANEOUS | Status: AC
  Administered 2019-08-12: 6 via TOPICAL

## 2019-08-12 MED ORDER — IOHEXOL 300 MG/ML  SOLN
100.0000 mL | Freq: Once | INTRAMUSCULAR | Status: AC | PRN
  Administered 2019-08-12: 100 mL via INTRAVENOUS

## 2019-08-12 MED ORDER — DEXAMETHASONE SODIUM PHOSPHATE 10 MG/ML IJ SOLN
INTRAMUSCULAR | Status: AC
  Filled 2019-08-12: qty 1

## 2019-08-12 MED ORDER — SODIUM CHLORIDE 0.9% FLUSH: 3.0000 mL | Freq: Once | INTRAVENOUS | Status: DC

## 2019-08-12 MED ORDER — ACETAMINOPHEN 325 MG PO TABS: 650.0000 mg | ORAL_TABLET | Freq: Four times a day (QID) | ORAL | Status: DC | PRN

## 2019-08-12 MED ORDER — ENOXAPARIN SODIUM 40 MG/0.4ML ~~LOC~~ SOLN
40.0000 mg | SUBCUTANEOUS | Status: DC
  Filled 2019-08-12: qty 0.4

## 2019-08-12 NOTE — ED Notes (Signed)
Pt transported to CT ?

## 2019-08-12 NOTE — Consult Note (Signed)
Reason for Consult: chest pain following recent [place] procedure Referring Physician: Dr. Trey Sailors is an 54 y.o. female. Presenting with chest and shoulder pain. She underwent an uncomplicated MLT procedure today by Dr. Lynnette Caffey in the office. Had sudden onset of CP and shoulder pain upon standing. Since then, has waxed and waned.It worse with standing and minimal at rest. Pain is mostly epigastric with some radiation to shoulder blades. She denies pelvic pain/cramping. Vagina feels sore.   Reports strong hx GERD requiring daily nexium. In the past has missed a dose and required ED visit s/s pain from reflux.   Pertinent Gynecological History: Menses: post-menopausal Bleeding: N/A Contraception: post menopausal status OB History: G4, P3013   Menstrual History: No LMP recorded. Patient is perimenopausal.    Past Medical History:  Diagnosis Date  . Anxiety   . Atypical chest pain 03/15/2017  . Bulging disc    CERVICAL  . Hemorrhoids   . IC (interstitial cystitis)    bladder stimulator 10/2012  . Left leg pain   . Palpitations 03/15/2017  . Parkinson's disease ([place])   . PONV (postoperative nausea and vomiting)   . PVC (premature ventricular contraction) 04/04/2017  . Seasonal allergies   . Urge incontinence     Past Surgical History:  Procedure Laterality Date  . BIOPSY N/A 10/27/2014   Procedure: BIOPSY;  Surgeon: Daneil Dolin, MD;  Location: [facility];  Service: Endoscopy;  Laterality: N/A;  duodenal, esophageal  . BREAST ENHANCEMENT SURGERY  AGE 15  . CESAREAN SECTION  12/2000  &  01-26-2003   X2  . COLONOSCOPY WITH PROPOFOL N/A 10/27/2014   Dr. Gala Romney: Minimal internal heorrhoids;otherwise normal rectum, colon and terminal ileum.   . cranial neurostimulator implant  04/2018   for deep brain simulation. [facility]  . CYSTO/ URETHRAL DILATION/ HOD  X2  LAST ONE 12-19-2004  . ESOPHAGEAL DILATION N/A 10/27/2014   Procedure: ESOPHAGEAL DILATION;  Surgeon: Daneil Dolin, MD;  Location: [facility];  Service: Endoscopy;  Laterality: N/AVenia Minks 75  . ESOPHAGOGASTRODUODENOSCOPY (EGD) WITH PROPOFOL N/A 10/27/2014   Dr. Gala Romney: Normal EGD. status post maloney dilation follwed by esophageal and duodenal biopsy. benign esophageal and duodenal biopsies  . HEMORRHOID BANDING  2016   Dr.Rourk  . INTERSTIM IMPLANT PLACEMENT N/A 10/08/2012   Procedure: Barrie Lyme IMPLANT FIRST STAGE;  Surgeon: Reece Packer, MD;  Location: [facility];  Service: Urology;  Laterality: N/A;  . INTERSTIM IMPLANT PLACEMENT N/A 10/08/2012   Procedure: Barrie Lyme IMPLANT SECOND STAGE;  Surgeon: Reece Packer, MD;  Location: [facility];  Service: Urology;  Laterality: N/A;  . LASIK     bilateral  . TONSILLECTOMY  AGE 40'S    Family History  Problem Relation Age of Onset  . Diabetes Mother        Died, 94  . Diabetes Father        Living, 83  . Cancer Father        prostate  . Hypertension Father   . Atrial fibrillation Father   . CAD Father   . Hypertension Maternal Grandmother   . Heart disease Maternal Grandmother   . Diabetes Paternal Grandfather   . Breast cancer Maternal Aunt   . Cancer Cousin        MATERNAL  . Stroke Paternal Grandmother   . Colon cancer Neg Hx     Social History:  reports that she has never smoked. She has  never used smokeless tobacco. She reports that she does not drink alcohol or use drugs.  Allergies:  Allergies  Allergen Reactions  . Other Diarrhea and Swelling    celery and tomatoes  . Tioconazole Itching and Swelling    Other reaction(s): Other (See Comments) swelling   . Monistat [Miconazole] Swelling  . Tape Itching    VERY IRRITATED VERY IRRITATED VERY IRRITATED    Medications: I have reviewed the patient's current medications.  Review of Systems  Blood pressure 107/74, pulse 83, temperature 97.7 F (36.5 C), temperature source Oral, resp. rate 16, SpO2 98 %. Physical Exam  Gen: well  appearing, NAD CV: Reg rate Pulm: NWOB Abd: soft, nondistended, nontender, no masses GYN: Bilateral atrophy externally and internally uterus 6 week size, no adnexa ttp/CMT. Cervix closed.  No bleeding No evidence of perforation or burns Ext: No edema b/l   Results for orders placed or performed during the hospital encounter of 08/12/19 (from the past 48 hour(s))  Basic metabolic panel     Status: Abnormal   Collection Time: 08/12/19 11:42 AM  Result Value Ref Range   Sodium 137 135 - 145 mmol/L   Potassium 3.7 3.5 - 5.1 mmol/L   Chloride 104 98 - 111 mmol/L   CO2 21 (L) 22 - 32 mmol/L   Glucose, Bld 95 70 - 99 mg/dL   BUN 19 6 - 20 mg/dL   Creatinine, Ser 8.36 (H) 0.44 - 1.00 mg/dL   Calcium 9.3 8.9 - 62.9 mg/dL   GFR calc non Af Amer >60 >60 mL/min   GFR calc Af Amer >60 >60 mL/min   Anion gap 12 5 - 15    Comment: Performed at [facility], [address] [place], [place] [address]  CBC     Status: None   Collection Time: 08/12/19 11:42 AM  Result Value Ref Range   WBC 5.4 4.0 - 10.5 K/uL   RBC 4.70 3.87 - 5.11 MIL/uL   Hemoglobin 14.4 12.0 - 15.0 g/dL   HCT 65.0 35.4 - 65.6 %   MCV 95.3 80.0 - 100.0 fL   MCH 30.6 26.0 - 34.0 pg   MCHC 32.1 30.0 - 36.0 g/dL   RDW 81.2 75.1 - 70.0 %   Platelets 257 150 - 400 K/uL   nRBC 0.0 0.0 - 0.2 %    Comment: Performed at [facility], [address] [place], [place] [address]  Troponin I (High Sensitivity)     Status: None   Collection Time: 08/12/19 11:42 AM  Result Value Ref Range   Troponin I (High Sensitivity) 6 <18 ng/L    Comment: (NOTE) Elevated high sensitivity troponin I (hsTnI) values and significant  changes across serial measurements may suggest ACS but many other  chronic and acute conditions are known to elevate hsTnI results.  Refer to the "Links" section for chest pain algorithms and additional  guidance. Performed at [facility], [address], [place], [place] [address]   I-Stat beta  hCG blood, ED     Status: None   Collection Time: 08/12/19 11:47 AM  Result Value Ref Range   I-stat hCG, quantitative <5.0 <5 mIU/mL   Comment 3            Comment:   GEST. AGE      CONC.  (mIU/mL)   <=1 WEEK        5 - 50     2 WEEKS  50 - 500     3 WEEKS       100 - 10,000     4 WEEKS     1,000 - 30,000        FEMALE AND NON-PREGNANT FEMALE:     LESS THAN 5 mIU/mL   Hepatic function panel     Status: None   Collection Time: 08/12/19 12:42 PM  Result Value Ref Range   Total Protein 7.0 6.5 - 8.1 g/dL   Albumin 4.2 3.5 - 5.0 g/dL   AST 28 15 - 41 U/L   ALT 28 0 - 44 U/L   Alkaline Phosphatase 101 38 - 126 U/L   Total Bilirubin 0.5 0.3 - 1.2 mg/dL   Bilirubin, Direct <8.2 0.0 - 0.2 mg/dL   Indirect Bilirubin NOT CALCULATED 0.3 - 0.9 mg/dL    Comment: Performed at [facility], [address], [place], [place] [address]  Urinalysis, Routine w reflex microscopic     Status: Abnormal   Collection Time: 08/12/19 12:44 PM  Result Value Ref Range   Color, Urine STRAW (A) YELLOW   APPearance CLEAR CLEAR   Specific Gravity, Urine 1.008 1.005 - 1.030   pH 6.0 5.0 - 8.0   Glucose, UA NEGATIVE NEGATIVE mg/dL   Hgb urine dipstick NEGATIVE NEGATIVE   Bilirubin Urine NEGATIVE NEGATIVE   Ketones, ur 5 (A) NEGATIVE mg/dL   Protein, ur NEGATIVE NEGATIVE mg/dL   Nitrite NEGATIVE NEGATIVE   Leukocytes,Ua NEGATIVE NEGATIVE    Comment: Performed at [facility], [address], [place], [place] [address]  Respiratory Panel by RT PCR (Flu A&B, Covid) - Nasopharyngeal Swab     Status: None   Collection Time: 08/12/19  1:27 PM   Specimen: Nasopharyngeal Swab  Result Value Ref Range   SARS Coronavirus 2 by RT PCR NEGATIVE NEGATIVE    Comment: (NOTE) SARS-CoV-2 target nucleic acids are NOT DETECTED. The SARS-CoV-2 RNA is generally detectable in upper respiratoy specimens during the acute phase of infection. The lowest concentration of SARS-CoV-2 viral copies this assay can  detect is 131 copies/mL. A negative result does not preclude SARS-Cov-2 infection and should not be used as the sole basis for treatment or other patient management decisions. A negative result may occur with  improper specimen collection/handling, submission of specimen other than nasopharyngeal swab, presence of viral mutation(s) within the areas targeted by this assay, and inadequate number of viral copies (<131 copies/mL). A negative result must be combined with clinical observations, patient history, and epidemiological information. The expected result is Negative. Fact Sheet for Patients:  https://www.moore.com/ Fact Sheet for Healthcare Providers:  https://www.young.biz/ This test is not yet ap proved or cleared by the [place] FDA and  has been authorized for detection and/or diagnosis of SARS-CoV-2 by FDA under an Emergency Use Authorization (EUA). This EUA will remain  in effect (meaning this test can be used) for the duration of the COVID-19 declaration under Section 564(b)(1) of the Act, 21 U.S.C. section 360bbb-3(b)(1), unless the authorization is terminated or revoked sooner.    Influenza A by PCR NEGATIVE NEGATIVE   Influenza B by PCR NEGATIVE NEGATIVE    Comment: (NOTE) The Xpert Xpress SARS-CoV-2/FLU/RSV assay is intended as an aid in  the diagnosis of influenza from Nasopharyngeal swab specimens and  should not be used as a sole basis for treatment. Nasal washings and  aspirates are unacceptable for Xpert Xpress SARS-CoV-2/FLU/RSV  testing. Fact Sheet for Patients: https://www.moore.com/ Fact Sheet  for Healthcare Providers: https://www.young.biz/ This test is not yet approved or cleared by the [place] and  has been authorized for detection and/or diagnosis of SARS-CoV-2 by  FDA under an Emergency Use Authorization (EUA). This EUA will remain  in effect (meaning this test  can be used) for the duration of the  Covid-19 declaration under Section 564(b)(1) of the Act, 21  U.S.C. section 360bbb-3(b)(1), unless the authorization is  terminated or revoked. Performed at [facility], [address], [place], [place] [address]   Troponin I (High Sensitivity)     Status: None   Collection Time: 08/12/19  1:49 PM  Result Value Ref Range   Troponin I (High Sensitivity) 3 <18 ng/L    Comment: (NOTE) Elevated high sensitivity troponin I (hsTnI) values and significant  changes across serial measurements may suggest ACS but many other  chronic and acute conditions are known to elevate hsTnI results.  Refer to the "Links" section for chest pain algorithms and additional  guidance. Performed at [facility], [address] [place], [place] [address]     DG Chest 2 View  Result Date: 08/12/2019 CLINICAL DATA:  Chest pain. Status post Drue Stager procedure EXAM: CHEST - 2 VIEW COMPARISON:  None. FINDINGS: The heart size is normal. No pleural effusion or edema. No airspace consolidation identified. Moderate pneumoperitoneum is identified of uncertain clinical significance. Visualized osseous structures are unremarkable. IMPRESSION: Exam positive for moderate pneumoperitoneum as seen by increased lucencies under bilateral hemidiaphragms. Although this may be a iatrogenic following a surgical procedure perforated viscus cannot be excluded. Electronically Signed   By: Signa Kell M.D.   On: 08/12/2019 12:26   CT ABDOMEN PELVIS W CONTRAST  Result Date: 08/12/2019 CLINICAL DATA:  Status post gynecologic procedure now with epigastric pain and pneumoperitoneum on chest radiograph EXAM: CT ABDOMEN AND PELVIS WITH CONTRAST TECHNIQUE: Multidetector CT imaging of the abdomen and pelvis was performed using the standard protocol following bolus administration of intravenous contrast. CONTRAST:  OMNIPAQUE IOHEXOL 300 MG/ML  SOLN COMPARISON:  None. FINDINGS: Lower chest:  No acute abnormality. Hepatobiliary: No focal liver abnormality is seen. No gallstones, gallbladder wall thickening, or biliary dilatation. Pancreas: Unremarkable. No pancreatic ductal dilatation or surrounding inflammatory changes. Spleen: Normal in size without focal abnormality. Adrenals/Urinary Tract: Normal appearance of the adrenal glands. The kidneys are unremarkable. Urinary bladder appears normal. Stomach/Bowel: Stomach is within normal limits. Appendix appears normal. No evidence of bowel wall thickening, distention, or inflammatory changes. Vascular/Lymphatic: No significant vascular findings are present. No enlarged abdominal or pelvic lymph nodes. Reproductive: Gas-filled vaginal cavity is identified. Additionally, there is gas within the cervix and endometrial cavity extending to the fundus. Additionally, several foci of gas are identified around the left ovary. No defect identified within the wall of the uterus or the vagina. Other: No free fluid and no fluid collections identified. A moderate to large volume of pneumoperitoneum is identified Musculoskeletal: No acute or significant osseous findings. Sciatic nerve stimulator is identified with battery pack located in the right gluteal subcutaneous soft tissues. A second battery pack is identified within the left lower quadrant ventral abdominal wall with leads extending cranially. IMPRESSION: 1. Moderate to large volume of pneumoperitoneum identified. This is felt to likely be iatrogenic secondary to recent vaginal laser rejuvination procedure. It is likely the gas traveled in a retrograde fashion from the uterus through the fallopian tubes into the peritoneal cavity. Less favored, would be a perforation through the wall of  the vagina or uterus. There is no patent defect identified within the vagina or uterus. Advise correlation with clinical exam findings. If patient's symptoms persist and/or worsen recommend gynecologic consultation 2. No free fluid  or fluid collections identified to suggest bowel perforation. These results were called by telephone at the time of interpretation on 08/12/2019 at 3:01 pm to provider [facility] , who verbally acknowledged these results. Electronically Signed   By: Signa Kell M.D.   On: 08/12/2019 15:02    Assessment/Plan:  54 yo presenting with gas filled pelvis and pneumoperitoneum s/p mona lisa touch procedure. She has a benign exam overall and vital signs have been stable.  Cannot exclude gastric or GI etiologies of pain but favor air extravasation through non-perforated uterus into abdominal cavity.  Thorough exam w/out any evidence of perforation or burns. Anticipate if pain is s/s CO2 gas from procedure, should improve and resolve quickly. If pain worsens or patient becomes unstable and diagnostic LSC is warranted to exclude other etiologies, please alert provider on call for [facility] so that we may be present to evaluate pelvic organs.  We will follow patient in the hospital.    Ranae Pila 08/12/2019

## 2019-08-12 NOTE — ED Notes (Signed)
Car keys given to family.

## 2019-08-12 NOTE — ED Notes (Signed)
This tech attempted to get EKG. Unable to perform EKG due to bladder stimulator.

## 2019-08-12 NOTE — ED Provider Notes (Signed)
[facility] EMERGENCY DEPARTMENT Provider Note   CSN: [id number] Arrival date & time: 08/12/19  1131     History Chief Complaint  Patient presents with  . Chest Pain    Kristina Lewis is a 54 y.o. female with a past medical history significant for Parkinson's disease status post neurostimulator, interstitial cystitis status post bladder stimulator, history of PVCs, and GERD who presents to the ED due to epigastric pain x2 hours.  Patient states she was at her gynecological appointment undergoing Drue Stager laser procedure when she started having sudden onset of epigastric pain that radiated up to her bilateral shoulders and bilateral neck, most significant in the left shoulder.  She admits to intermittent episodes of chest tightness x4 to 5 months.  She recently had a bladder stimulator replaced in November.  Patient denies nausea and vomiting.  Denies fever and chills.  Epigastric pain is associated with shortness of breath and worse with deep inspiration.  Patient denies recent illness. Patient admits to changes to her bowel movements over the past few months that change from diarrhea to constipation.     Past Medical History:  Diagnosis Date  . Anxiety   . Atypical chest pain 03/15/2017  . Bulging disc    CERVICAL  . Hemorrhoids   . IC (interstitial cystitis)    bladder stimulator 10/2012  . Left leg pain   . Palpitations 03/15/2017  . Parkinson's disease (HCC)   . PONV (postoperative nausea and vomiting)   . PVC (premature ventricular contraction) 04/04/2017  . Seasonal allergies   . Urge incontinence     Patient Active Problem List   Diagnosis Date Noted  . Dyspepsia 08/13/2018  . PVC (premature ventricular contraction) 04/04/2017  . Palpitations 03/15/2017  . Atypical chest pain 03/15/2017  . Globus sensation 05/17/2016  . Abdominal pain 05/17/2016  . First degree hemorrhoids   . Loss of weight 10/15/2014  . GERD (gastroesophageal reflux disease)  10/15/2014  . Dysphagia, pharyngoesophageal phase 10/14/2014  . Rectal bleeding 10/14/2014  . External hemorrhoid 08/01/2012  . Constipation 08/01/2012  . Dysmenorrhea 02/15/2012  . Leukorrhea 02/15/2012  . Chronic headaches 02/15/2012  . Perimenopausal vasomotor symptoms 11/28/2011  . IC (interstitial cystitis) 08/17/2011    Past Surgical History:  Procedure Laterality Date  . BIOPSY N/A 10/27/2014   Procedure: BIOPSY;  Surgeon: Corbin Ade, MD;  Location: [facility];  Service: Endoscopy;  Laterality: N/A;  duodenal, esophageal  . BREAST ENHANCEMENT SURGERY  AGE 69  . CESAREAN SECTION  12/2000  &  01-26-2003   X2  . COLONOSCOPY WITH PROPOFOL N/A 10/27/2014   Dr. Jena Gauss: Minimal internal heorrhoids;otherwise normal rectum, colon and terminal ileum.   . cranial neurostimulator implant  04/2018   for deep brain simulation. [facility]  . CYSTO/ URETHRAL DILATION/ HOD  X2  LAST ONE 12-19-2004  . ESOPHAGEAL DILATION N/A 10/27/2014   Procedure: ESOPHAGEAL DILATION;  Surgeon: Corbin Ade, MD;  Location: [facility];  Service: Endoscopy;  Laterality: N/AElease Hashimoto 53  . ESOPHAGOGASTRODUODENOSCOPY (EGD) WITH PROPOFOL N/A 10/27/2014   Dr. Jena Gauss: Normal EGD. status post maloney dilation follwed by esophageal and duodenal biopsy. benign esophageal and duodenal biopsies  . HEMORRHOID BANDING  2016   Dr.Rourk  . INTERSTIM IMPLANT PLACEMENT N/A 10/08/2012   Procedure: [name] IMPLANT FIRST STAGE;  Surgeon: Martina Sinner, MD;  Location[facility];  Service: Urology;  Laterality: N/A;  . INTERSTIM IMPLANT PLACEMENT N/A 10/08/2012   Procedure: [name] IMPLANT  SECOND STAGE;  Surgeon: Martina Sinner, MD;  Location: [facility];  Service: Urology;  Laterality: N/A;  . LASIK     bilateral  . TONSILLECTOMY  AGE 67'S     OB History    Gravida  3   Para  2   Term  2   Preterm      AB      Living  3     SAB      TAB      Ectopic      Multiple  1   Live  Births  3           Family History  Problem Relation Age of Onset  . Diabetes Mother        Died, 68  . Diabetes Father        Living, 34  . Cancer Father        prostate  . Hypertension Father   . Atrial fibrillation Father   . CAD Father   . Hypertension Maternal Grandmother   . Heart disease Maternal Grandmother   . Diabetes Paternal Grandfather   . Breast cancer Maternal Aunt   . Cancer Cousin        MATERNAL  . Stroke Paternal Grandmother   . Colon cancer Neg Hx     Social History   Tobacco Use  . Smoking status: Never Smoker  . Smokeless tobacco: Never Used  Substance Use Topics  . Alcohol use: No    Alcohol/week: 0.0 standard drinks  . Drug use: No    Home Medications Prior to Admission medications   Medication Sig Start Date End Date Taking? Authorizing Provider  ALPRAZolam Prudy Feeler) 1 MG tablet Take 1.5 mg by mouth at bedtime.    Yes [provider]  baclofen (LIORESAL) 10 MG tablet Take 10 mg by mouth 2 (two) times daily as needed for muscle spasms.    Yes [provider]  Cholecalciferol (VITAMIN D PO) Take 1 tablet by mouth daily.    Yes [provider]  esomeprazole (NEXIUM) 40 MG capsule TAKE 1 CAPSULE DAILY BEFORE BREAKFAST Patient taking differently: Take 40 mg by mouth daily.  07/16/19  Yes Gelene Mink, NP  FLUoxetine (PROZAC) 20 MG tablet Take 40 mg by mouth daily.    Yes [provider]  ibuprofen (ADVIL,MOTRIN) 200 MG tablet Take 800 mg by mouth every 6 (six) hours as needed for pain.    Yes [provider]  Melatonin 3 MG TABS Take 3 mg by mouth at bedtime. 01/21/18  Yes [provider]  zonisamide (ZONEGRAN) 100 MG capsule Take 400 mg by mouth at bedtime.    Yes [provider]  acetaminophen (TYLENOL) 500 MG tablet Take 1,000 mg by mouth every 8 (eight) hours as needed for pain.    [provider]    Allergies    Other, Tioconazole, Monistat [miconazole], and  Tape  Review of Systems   Review of Systems  Constitutional: Negative for chills and fever.  Respiratory: Positive for shortness of breath.   Cardiovascular: Negative for chest pain.  Gastrointestinal: Positive for abdominal pain, constipation and diarrhea. Negative for abdominal distention, nausea and vomiting.  Genitourinary: Positive for dysuria.  Musculoskeletal: Positive for arthralgias (bilateral shoulders) and neck pain.  All other systems reviewed and are negative.   Physical Exam Updated Vital Signs BP 120/81 (BP Location: Right Arm)   Pulse 75   Temp 97.7 F (36.5 C) (Oral)  Resp 15   SpO2 100%   Physical Exam Vitals and nursing note reviewed.  Constitutional:      General: She is not in acute distress.    Appearance: She is not toxic-appearing.  HENT:     Head: Normocephalic.  Eyes:     Pupils: Pupils are equal, round, and reactive to light.  Cardiovascular:     Rate and Rhythm: Normal rate and regular rhythm.     Pulses: Normal pulses.     Heart sounds: Normal heart sounds. No murmur. No friction rub. No gallop.   Pulmonary:     Effort: Pulmonary effort is normal.     Breath sounds: Normal breath sounds.  Abdominal:     General: Abdomen is flat. Bowel sounds are normal. There is no distension.     Palpations: Abdomen is soft.     Tenderness: There is abdominal tenderness. There is no guarding or rebound.     Comments: Diffuse tenderness to palpation most significant in central abdomen with occasional voluntary guarding.   Musculoskeletal:     Cervical back: Neck supple.     Comments: Able to move all 4 extremities without difficulty.  Skin:    General: Skin is warm and dry.  Neurological:     General: No focal deficit present.     Mental Status: She is alert.  Psychiatric:        Mood and Affect: Mood normal.        Behavior: Behavior normal.     ED Results / Procedures / Treatments   Labs (all labs ordered are listed, but only abnormal results  are displayed) Labs Reviewed  BASIC METABOLIC PANEL  CBC  I-STAT BETA HCG BLOOD, ED ([facility], [facility], [facility] ONLY)  TROPONIN I (HIGH SENSITIVITY)    EKG None  Radiology DG Chest 2 View  Result Date: 08/12/2019 CLINICAL DATA:  Chest pain. Status post Drue Stager procedure EXAM: CHEST - 2 VIEW COMPARISON:  None. FINDINGS: The heart size is normal. No pleural effusion or edema. No airspace consolidation identified. Moderate pneumoperitoneum is identified of uncertain clinical significance. Visualized osseous structures are unremarkable. IMPRESSION: Exam positive for moderate pneumoperitoneum as seen by increased lucencies under bilateral hemidiaphragms. Although this may be a iatrogenic following a surgical procedure perforated viscus cannot be excluded. Electronically Signed   By: Signa Kell M.D.   On: 08/12/2019 12:26    Procedures Procedures (including critical care time)  Medications Ordered in ED Medications  sodium chloride flush (NS) 0.9 % injection 3 mL (has no administration in time range)    ED Course  I have reviewed the triage vital signs and the nursing notes.  Pertinent labs & imaging results that were available during my care of the patient were reviewed by me and considered in my medical decision making (see chart for details).  Clinical Course as of Aug 11 1598  Tue Aug 12, 2019  1518 Spoke to [facility] office who notes that Dr. Langston Masker is speaking to Dr. Dwain Sarna about patient's case.    [CA]    Clinical Course User Index [CA] Mannie Stabile, PA-C   MDM Rules/Calculators/A&P                     54 year old female presents to the ED due to sudden onset of severe epigastric pain after undergoing a mental Misty Stanley procedure at her OB/GYN office.  It was all within normal limits.  Patient is afebrile, not tachycardic or hypoxic.  Patient in  no acute distress and nontoxic-appearing.  Physical exam significant for diffuse abdominal tenderness with occasional voluntary guarding most  significant in the epigastric region.  Chest x-ray personally reviewed which demonstrates: Exam positive for moderate pneumoperitoneum as seen by increased  lucencies under bilateral hemidiaphragms. Although this may be a  iatrogenic following a surgical procedure perforated viscus cannot  be excluded.   Will obtain CT scan to evaluate for possible perforation. Discussed case with Dr. Alvino Chapel who evaluated patient at bedside and agrees with assessment and plan.  CBC unremarkable with no leukocytosis.  Initial troponin normal at 6. Doubt cardiac etiology Pregnancy test negative.  BMP reassuring with mild creatinine elevation at 1.05.  Normal hepatic function panel. CT scan personally reviewed which demonstrates: 1. Moderate to large volume of pneumoperitoneum identified. This is  felt to likely be iatrogenic secondary to recent vaginal laser  rejuvination procedure. It is likely the gas traveled in a  retrograde fashion from the uterus through the fallopian tubes into  the peritoneal cavity. Less favored, would be a perforation through  the wall of the vagina or uterus. There is no patent defect  identified within the vagina or uterus. Advise correlation with  clinical exam findings. If patient's symptoms persist and/or worsen  recommend gynecologic consultation  2. No free fluid or fluid collections identified to suggest bowel  perforation.   UA negative for signs of infection.  Delta troponin normal.  Doubt ACS and cardiac related etiology.  Suspect pain is coming from pneumoperitoneum. Dr. Alvino Chapel and myself spoke to Dr. Donne Hazel with general surgery who evaluated patient at bedside. OBGYN agrees to evaluate patient at bedside to perform a pelvic exam to evaluate for acute abnormalities. Patient will be admitted to the hospital for further evaluation and treatment.  Final Clinical Impression(s) / ED Diagnoses Final diagnoses:  None    Rx / DC Orders ED Discharge Orders    None        Karie Kirks 08/12/19 1622    Davonna Belling, MD 08/13/19 [phone]

## 2019-08-12 NOTE — H&P (Addendum)
Kristina Lewis is an 54 y.o. female.   Chief Complaint: ab pain HPI: 63 yof underwent mona lisa touch procedure today.  Had acute onset of upper abdominal pain today after completing procedure.  Has had some upper abdominal pain for a year.  She uses some nsaids.  She has some nausea no emesis.  She has history of early onset parkinsons and has deep brain stimulator.  She was sent here and was noted to have pneumoperitoneum on xray.   Past Medical History:  Diagnosis Date  . Anxiety   . Atypical chest pain 03/15/2017  . Bulging disc    CERVICAL  . Hemorrhoids   . IC (interstitial cystitis)    bladder stimulator 10/2012  . Left leg pain   . Palpitations 03/15/2017  . Parkinson's disease (HCC)   . PONV (postoperative nausea and vomiting)   . PVC (premature ventricular contraction) 04/04/2017  . Seasonal allergies   . Urge incontinence     Past Surgical History:  Procedure Laterality Date  . BIOPSY N/A 10/27/2014   Procedure: BIOPSY;  Surgeon: Corbin Ade, MD;  Location: [facility];  Service: Endoscopy;  Laterality: N/A;  duodenal, esophageal  . BREAST ENHANCEMENT SURGERY  AGE 54  . CESAREAN SECTION  12/2000  &  01-26-2003   X2  . COLONOSCOPY WITH PROPOFOL N/A 10/27/2014   Dr. Jena Gauss: Minimal internal heorrhoids;otherwise normal rectum, colon and terminal ileum.   . cranial neurostimulator implant  04/2018   for deep brain simulation. [facility]  . CYSTO/ URETHRAL DILATION/ HOD  X2  LAST ONE 12-19-2004  . ESOPHAGEAL DILATION N/A 10/27/2014   Procedure: ESOPHAGEAL DILATION;  Surgeon: Corbin Ade, MD;  Location: [facility];  Service: Endoscopy;  Laterality: N/AElease Hashimoto 53  . ESOPHAGOGASTRODUODENOSCOPY (EGD) WITH PROPOFOL N/A 10/27/2014   Dr. Jena Gauss: Normal EGD. status post maloney dilation follwed by esophageal and duodenal biopsy. benign esophageal and duodenal biopsies  . HEMORRHOID BANDING  2016   Dr.Rourk  . INTERSTIM IMPLANT PLACEMENT N/A 10/08/2012   Procedure: [name] IMPLANT FIRST STAGE;   Surgeon: Martina Sinner, MD;  Location: [facility];  Service: Urology;  Laterality: N/A;  . INTERSTIM IMPLANT PLACEMENT N/A 10/08/2012   Procedure: [name] IMPLANT SECOND STAGE;  Surgeon: Martina Sinner, MD;  Location: [facility];  Service: Urology;  Laterality: N/A;  . LASIK     bilateral  . TONSILLECTOMY  AGE 31'S    Family History  Problem Relation Age of Onset  . Diabetes Mother        Died, 52  . Diabetes Father        Living, 22  . Cancer Father        prostate  . Hypertension Father   . Atrial fibrillation Father   . CAD Father   . Hypertension Maternal Grandmother   . Heart disease Maternal Grandmother   . Diabetes Paternal Grandfather   . Breast cancer Maternal Aunt   . Cancer Cousin        MATERNAL  . Stroke Paternal Grandmother   . Colon cancer Neg Hx    Social History:  reports that she has never smoked. She has never used smokeless tobacco. She reports that she does not drink alcohol or use drugs.  Allergies:  Allergies  Allergen Reactions  . Other Diarrhea and Swelling    celery and tomatoes  . Tioconazole Itching and Swelling    Other reaction(s): Other (See Comments) swelling   . Monistat [  Miconazole] Swelling  . Tape Itching    VERY IRRITATED VERY IRRITATED VERY IRRITATED    Meds reviewed  Results for orders placed or performed during the hospital encounter of 08/12/19 (from the past 48 hour(s))  Basic metabolic panel     Status: Abnormal   Collection Time: 08/12/19 11:42 AM  Result Value Ref Range   Sodium 137 135 - 145 mmol/L   Potassium 3.7 3.5 - 5.1 mmol/L   Chloride 104 98 - 111 mmol/L   CO2 21 (L) 22 - 32 mmol/L   Glucose, Bld 95 70 - 99 mg/dL   BUN 19 6 - 20 mg/dL   Creatinine, Ser 1.05 (H) 0.44 - 1.00 mg/dL   Calcium 9.3 8.9 - 10.3 mg/dL   GFR calc non Af Amer >60 >60 mL/min   GFR calc Af Amer >60 >60 mL/min   Anion gap 12 5 - 15    Comment: Performed at [facility], [place] [address], [place] [address]  CBC     Status: None   Collection Time: 08/12/19 11:42 AM  Result Value Ref Range   WBC 5.4 4.0 - 10.5 K/uL   RBC 4.70 3.87 - 5.11 MIL/uL   Hemoglobin 14.4 12.0 - 15.0 g/dL   HCT 44.8 36.0 - 46.0 %   MCV 95.3 80.0 - 100.0 fL   MCH 30.6 26.0 - 34.0 pg   MCHC 32.1 30.0 - 36.0 g/dL   RDW 13.2 11.5 - 15.5 %   Platelets 257 150 - 400 K/uL   nRBC 0.0 0.0 - 0.2 %    Comment: Performed at [facility], [place] [address] [place], [place] [address]  Troponin I (High Sensitivity)     Status: None   Collection Time: 08/12/19 11:42 AM  Result Value Ref Range   Troponin I (High Sensitivity) 6 <18 ng/L    Comment: (NOTE) Elevated high sensitivity troponin I (hsTnI) values and significant  changes across serial measurements may suggest ACS but many other  chronic and acute conditions are known to elevate hsTnI results.  Refer to the "Links" section for chest pain algorithms and additional  guidance. Performed at [facility], [place] [address], [place], [place] [address]   I-Stat beta hCG blood, ED     Status: None   Collection Time: 08/12/19 11:47 AM  Result Value Ref Range   I-stat hCG, quantitative <5.0 <5 mIU/mL   Comment 3            Comment:   GEST. AGE      CONC.  (mIU/mL)   <=1 WEEK        5 - 50     2 WEEKS       50 - 500     3 WEEKS       100 - 10,000     4 WEEKS     1,000 - 30,000        FEMALE AND NON-PREGNANT FEMALE:     LESS THAN 5 mIU/mL   Hepatic function panel     Status: None   Collection Time: 08/12/19 12:42 PM  Result Value Ref Range   Total Protein 7.0 6.5 - 8.1 g/dL   Albumin 4.2 3.5 - 5.0 g/dL   AST 28 15 - 41 U/L   ALT 28 0 - 44 U/L   Alkaline Phosphatase 101 38 - 126 U/L   Total Bilirubin 0.5 0.3 - 1.2 mg/dL   Bilirubin, Direct <  0.1 0.0 - 0.2 mg/dL   Indirect Bilirubin NOT CALCULATED 0.3 - 0.9 mg/dL    Comment: Performed at [facility], [address] [place], [place] [address]  Urinalysis, Routine w reflex  microscopic     Status: Abnormal   Collection Time: 08/12/19 12:44 PM  Result Value Ref Range   Color, Urine STRAW (A) YELLOW   APPearance CLEAR CLEAR   Specific Gravity, Urine 1.008 1.005 - 1.030   pH 6.0 5.0 - 8.0   Glucose, UA NEGATIVE NEGATIVE mg/dL   Hgb urine dipstick NEGATIVE NEGATIVE   Bilirubin Urine NEGATIVE NEGATIVE   Ketones, ur 5 (A) NEGATIVE mg/dL   Protein, ur NEGATIVE NEGATIVE mg/dL   Nitrite NEGATIVE NEGATIVE   Leukocytes,Ua NEGATIVE NEGATIVE    Comment: Performed at [facility], [address], [place], [place] [address]   DG Chest 2 View  Result Date: 08/12/2019 CLINICAL DATA:  Chest pain. Status post Drue Stager procedure EXAM: CHEST - 2 VIEW COMPARISON:  None. FINDINGS: The heart size is normal. No pleural effusion or edema. No airspace consolidation identified. Moderate pneumoperitoneum is identified of uncertain clinical significance. Visualized osseous structures are unremarkable. IMPRESSION: Exam positive for moderate pneumoperitoneum as seen by increased lucencies under bilateral hemidiaphragms. Although this may be a iatrogenic following a surgical procedure perforated viscus cannot be excluded. Electronically Signed   By: Signa Kell M.D.   On: 08/12/2019 12:26    Review of Systems  Gastrointestinal: Positive for abdominal pain and nausea.  All other systems reviewed and are negative.   Blood pressure 107/71, pulse 79, temperature 97.7 F (36.5 C), temperature source Oral, resp. rate 15, SpO2 99 %. Physical Exam  Vitals reviewed. Constitutional: She is oriented to person, place, and time. She appears well-developed and well-nourished.  HENT:  Head: Normocephalic and atraumatic.  Mouth/Throat: Oropharynx is clear and moist.  Eyes: Pupils are equal, round, and reactive to light. No scleral icterus.  Neck: No thyromegaly present.  Cardiovascular: Normal rate, regular rhythm, normal heart sounds and intact distal pulses.  Respiratory: Effort normal  and breath sounds normal.  GI: Soft. Bowel sounds are normal. She exhibits no distension. There is abdominal tenderness in the right upper quadrant and epigastric area. No hernia.    Musculoskeletal:     Cervical back: Neck supple.  Lymphadenopathy:    She has no cervical adenopathy.  Neurological: She is alert and oriented to person, place, and time. She has normal strength. GCS eye subscore is 4. GCS verbal subscore is 5. GCS motor subscore is 6.  Skin: Skin is warm and dry.  Psychiatric: She has a normal mood and affect. Her behavior is normal.     Assessment/Plan Pneumoperitoneum -radiology believes this could be related to procedure earlier, temporally this is related. I thought iniitally she just needs to go to OR.  There is no fluid on scan and she is only very mildly tender.  I discussed with Dr Elon Spanner and she is going to come examine her and will come up with a plan. Might be reasonable to observe overnight and possibly do laparoscopy if worsens or not better.  I will await gyn. I discussed this plan with her husband.  Emelia Loron, MD 08/12/2019, 2:44 PM   Addendum: I discussed with Dr Elon Spanner and talked to pateint again. I again am not sure how this occurred with the procedure earlier but temporally it is related.  She really has no tenderness now, vitals are all normal. We  discussed options of laparoscopy vs observation. There are variety of factors making observation reasonable.  There is some risk if this is not successful but we both agree that this would be reasonable to observe.

## 2019-08-12 NOTE — ED Notes (Signed)
Dr. Wakefield at bedside. 

## 2019-08-12 NOTE — ED Notes (Signed)
Pt aware that urine sample is needed, but is unable to provide one at this time 

## 2019-08-12 NOTE — ED Notes (Signed)
Unable to preform EKG due to pt has back and bladder stimulator. EKG will not pick up and I have tried multiple machines. RN aware. Pts son coming to bring cut off for stimulator.

## 2019-08-12 NOTE — ED Triage Notes (Signed)
Pt in with central chest/epigastric pain x 2 hrs. States she was at gynecological appt, got a Drue Stager laser procedure done and then began having cp. States it is hard to breathe, worse pain w/deep breath. Pain radiates into bilateral neck and L shoulder

## 2019-08-13 LAB — CBC
HCT: 41.3 % (ref 36.0–46.0)
Hemoglobin: 13.4 g/dL (ref 12.0–15.0)
MCH: 30.8 pg (ref 26.0–34.0)
MCHC: 32.4 g/dL (ref 30.0–36.0)
MCV: 94.9 fL (ref 80.0–100.0)
Platelets: 225 10*3/uL (ref 150–400)
RBC: 4.35 MIL/uL (ref 3.87–5.11)
RDW: 13.5 % (ref 11.5–15.5)
WBC: 5.2 10*3/uL (ref 4.0–10.5)
nRBC: 0 % (ref 0.0–0.2)

## 2019-08-13 LAB — BASIC METABOLIC PANEL
Anion gap: 8 (ref 5–15)
BUN: 16 mg/dL (ref 6–20)
CO2: 23 mmol/L (ref 22–32)
Calcium: 9.3 mg/dL (ref 8.9–10.3)
Chloride: 108 mmol/L (ref 98–111)
Creatinine, Ser: 1.17 mg/dL — ABNORMAL HIGH (ref 0.44–1.00)
GFR calc Af Amer: >60 mL/min (ref >60)
GFR calc non Af Amer: 54 mL/min — ABNORMAL LOW (ref >60)
Glucose, Bld: 73 mg/dL (ref 70–99)
Potassium: 3.7 mmol/L (ref 3.5–5.1)
Sodium: 139 mmol/L (ref 135–145)

## 2019-08-13 MED ORDER — SIMETHICONE 80 MG PO CHEW: 80.0000 mg | CHEWABLE_TABLET | Freq: Four times a day (QID) | ORAL | Status: DC | PRN

## 2019-08-13 NOTE — Progress Notes (Signed)
Discharge instructions reviewed with pt.  Pt verbalized understanding and had no questions.  Pt discharged in stable condition via wheelchair with husband.  Shiloh Swopes Lindsay  

## 2019-08-13 NOTE — Discharge Instructions (Signed)
Ambulation will help with reabsorption of gas. You can also take simethicone (GasX) as needed to help with gas pains.   Follow up with GYN as planned.  Simethicone chewable tablets What is this medicine? SIMETHICONE (sye METH i kone) is used to decrease the discomfort caused by gas. This medicine may be used for other purposes; ask your health care provider or pharmacist if you have questions. COMMON BRAND NAME(S): Gas Relief, Gas-X, Gas-X Extra Strength, Genasyme, Mylanta Gas, Mytab Gas, Phazyme What should I tell my health care provider before I take this medicine? They need to know if you have any of these conditions:  phenylketonuria  an unusual or allergic reaction to simethicone, other medicines, foods, dyes, or preservatives  pregnant or trying to get pregnant  breast-feeding How should I use this medicine? Take this medicine by mouth. Follow the directions on the prescription or product label. Chew or crush it completely before swallowing. Do not take your medicine more often than directed. Talk to your pediatrician regarding the use of this medicine in children. While this medicine may be used in children as young as 12 years for selected conditions, precautions do apply. Overdosage: If you think you have taken too much of this medicine contact a poison control center or emergency room at once. NOTE: This medicine is only for you. Do not share this medicine with others. What if I miss a dose? This does not apply. You will only use this medicine as needed for gas pain. Do not use double or extra doses. What may interact with this medicine? Interactions are not expected. This list may not describe all possible interactions. Give your health care provider a list of all the medicines, herbs, non-prescription drugs, or dietary supplements you use. Also tell them if you smoke, drink alcohol, or use illegal drugs. Some items may interact with your medicine. What should I watch for  while using this medicine? Tell your doctor or health care professional if your symptoms get worse, or if you have severe pain, diarrhea, constipation, or blood in your stool. These could be signs of a more serious condition. What side effects may I notice from receiving this medicine? There are no reported side effects of this medicine. This list may not describe all possible side effects. Call your doctor for medical advice about side effects. You may report side effects to FDA at 1-800-FDA-1088. Where should I keep my medicine? Keep out of the reach of children. Store at room temperature between 15 and 30 degrees C (59 and 86 degrees F). Keep container tightly closed. Throw away any unused medicine after the expiration date. NOTE: This sheet is a summary. It may not cover all possible information. If you have questions about this medicine, talk to your doctor, pharmacist, or health care provider.  2020 Elsevier/Gold Standard (2019-04-01 19:05:27)

## 2019-08-13 NOTE — Discharge Summary (Signed)
[facility] Discharge Summary   Patient ID: Kristina Lewis MRN: [id number] DOB/AGE: 1965-10-22 54 y.o.  Admit date: 08/12/2019 Discharge date: 08/13/2019  Admitting Diagnosis: Pneumoperitoneum s/p Josph Macho procedure  Discharge Diagnosis Pneumoperitoneum s/p Josph Macho procedure  Consultants GYN  Imaging: DG Chest 2 View  Result Date: 08/12/2019 CLINICAL DATA:  Chest pain. Status post Josph Macho procedure EXAM: CHEST - 2 VIEW COMPARISON:  None. FINDINGS: The heart size is normal. No pleural effusion or edema. No airspace consolidation identified. Moderate pneumoperitoneum is identified of uncertain clinical significance. Visualized osseous structures are unremarkable. IMPRESSION: Exam positive for moderate pneumoperitoneum as seen by increased lucencies under bilateral hemidiaphragms. Although this may be a iatrogenic following a surgical procedure perforated viscus cannot be excluded. Electronically Signed   By: Kerby Moors M.D.   On: 08/12/2019 12:26   CT ABDOMEN PELVIS W CONTRAST  Result Date: 08/12/2019 CLINICAL DATA:  Status post gynecologic procedure now with epigastric pain and pneumoperitoneum on chest radiograph EXAM: CT ABDOMEN AND PELVIS WITH CONTRAST TECHNIQUE: Multidetector CT imaging of the abdomen and pelvis was performed using the standard protocol following bolus administration of intravenous contrast. CONTRAST:  177mL OMNIPAQUE IOHEXOL 300 MG/ML  SOLN COMPARISON:  None. FINDINGS: Lower chest: No acute abnormality. Hepatobiliary: No focal liver abnormality is seen. No gallstones, gallbladder wall thickening, or biliary dilatation. Pancreas: Unremarkable. No pancreatic ductal dilatation or surrounding inflammatory changes. Spleen: Normal in size without focal abnormality. Adrenals/Urinary Tract: Normal appearance of the adrenal glands. The kidneys are unremarkable. Urinary bladder appears normal. Stomach/Bowel: Stomach is within normal limits. Appendix appears  normal. No evidence of bowel wall thickening, distention, or inflammatory changes. Vascular/Lymphatic: No significant vascular findings are present. No enlarged abdominal or pelvic lymph nodes. Reproductive: Gas-filled vaginal cavity is identified. Additionally, there is gas within the cervix and endometrial cavity extending to the fundus. Additionally, several foci of gas are identified around the left ovary. No defect identified within the wall of the uterus or the vagina. Other: No free fluid and no fluid collections identified. A moderate to large volume of pneumoperitoneum is identified Musculoskeletal: No acute or significant osseous findings. Sciatic nerve stimulator is identified with battery pack located in the right gluteal subcutaneous soft tissues. A second battery pack is identified within the left lower quadrant ventral abdominal wall with leads extending cranially. IMPRESSION: 1. Moderate to large volume of pneumoperitoneum identified. This is felt to likely be iatrogenic secondary to recent vaginal laser rejuvination procedure. It is likely the gas traveled in a retrograde fashion from the uterus through the fallopian tubes into the peritoneal cavity. Less favored, would be a perforation through the wall of the vagina or uterus. There is no patent defect identified within the vagina or uterus. Advise correlation with clinical exam findings. If patient's symptoms persist and/or worsen recommend gynecologic consultation 2. No free fluid or fluid collections identified to suggest bowel perforation. These results were called by telephone at the time of interpretation on 08/12/2019 at 3:01 pm to provider [facility] , who verbally acknowledged these results. Electronically Signed   By: Kerby Moors M.D.   On: 08/12/2019 15:02    Procedures Dr. Lynnette Caffey (08/12/19) - Josph Macho procedure  Hospital Course:  Patient is 54 year old female who presented to [facility] with abdominal pain s/p GYN procedure  earlier in the day.  Workup showed pneumoperitoneum. Patient was not very tender on exam and patient felt to be stable to observe. Admitted for observation and GYN consulted. Determined that if  patient worsened at all she may require laparoscopy. On 08/13/19, the patient was voiding well, tolerating diet, ambulating well, pain well controlled, vital signs stable and felt stable for discharge home.  Patient will follow up with GYN as planned.   Physical Exam: General: pleasant, WD, WN white female who is laying in bed in NAD HEENT: head is normocephalic, atraumatic.  Sclera are noninjected.  PERRL.  Ears and nose without any masses or lesions.  Mouth is pink and moist Heart: regular, rate, and rhythm.  Normal s1,s2. No obvious murmurs, gallops, or rubs noted.  Palpable radial and pedal pulses bilaterally Lungs: CTAB, no wheezes, rhonchi, or rales noted.  Respiratory effort nonlabored Abd: soft, NT, mildly distended, +BS, no masses, hernias, or organomegaly MS: all 4 extremities are symmetrical with no cyanosis, clubbing, or edema. Skin: warm and dry with no masses, lesions, or rashes Neuro: Cranial nerves 2-12 grossly intact, speech is normal Psych: A&Ox3 with an appropriate affect.   Allergies as of 08/13/2019      Reactions   Other Diarrhea, Swelling   celery and tomatoes   Tioconazole Itching, Swelling   Other reaction(s): Other (See Comments) swelling   Monistat [miconazole] Swelling   Tape Itching   VERY IRRITATED VERY IRRITATED VERY IRRITATED      Medication List    TAKE these medications   acetaminophen 500 MG tablet Commonly known as: TYLENOL Take 1,000 mg by mouth every 8 (eight) hours as needed for pain.   ALPRAZolam 1 MG tablet Commonly known as: XANAX Take 1.5 mg by mouth at bedtime.   baclofen 10 MG tablet Commonly known as: LIORESAL Take 10 mg by mouth 2 (two) times daily as needed for muscle spasms.   esomeprazole 40 MG capsule Commonly known as: NEXIUM TAKE 1  CAPSULE DAILY BEFORE BREAKFAST What changed: See the new instructions.   FLUoxetine 20 MG tablet Commonly known as: PROZAC Take 40 mg by mouth daily.   ibuprofen 200 MG tablet Commonly known as: ADVIL Take 800 mg by mouth every 6 (six) hours as needed for pain.   Melatonin 3 MG Tabs Take 3 mg by mouth at bedtime.   simethicone 80 MG chewable tablet Commonly known as: Gas-X Chew 1 tablet (80 mg total) by mouth 4 (four) times daily as needed for flatulence.   VITAMIN D PO Take 1 tablet by mouth daily.   zonisamide 100 MG capsule Commonly known as: ZONEGRAN Take 400 mg by mouth at bedtime.        Follow-up Information    Morris, Aundra Millet, DO. Call.   Specialty: Obstetrics and Gynecology Why: Follow up as planned  Contact information: [address], [address] [place] [address] [phone]           Signed: Wells Guiles, [facility] 08/13/2019, 4:31 PM Please see Amion for pager number during day hours 7:00am-4:30pm

## 2019-08-13 NOTE — Progress Notes (Signed)
Subjective: Patient reports unchanged severity of pain since admission.  She had BM and is passing flatus with no relief in abdominal pain.  Currently, her pain is epigastric, RLQ and left shoulder.  Location of pain changes positionally.  No vaginal bleeding or other symptoms.  Has appetite this morning.  No difficulties with voiding.    Objective: I have reviewed patient's vital signs, intake and output, medications and labs.  General: alert, cooperative and appears stated age GI: soft, minimal distension.  RLQ pain with palpation; no rebound or guarding.   Extremities: extremities normal, atraumatic, no cyanosis or edema   Assessment/Plan: 54yo HD2/POD1 s/p MonaLisa Touch (CO2 vaginal laser treatment) with postop abdominal pain I informed patient that I believe her pain to be, at least in part, due to free air introduced by MLT yesterday.  While this is not a common occurrence following treatment, it makes sense based on CT findings that this is the cause.  Certainly given her many medical comorbidities, there may be additional causes for her pain and I defer to admitting service for management of those.  I counseled the patient that unfortunately, there is no way to expedite the process of reabsorption of CO2.  Will continue to follow along.     LOS: 0 days    Mitchel Honour 08/13/2019, 8:23 AM

## 2019-08-27 ENCOUNTER — Encounter: Payer: Self-pay | Admitting: Gastroenterology

## 2019-08-27 ENCOUNTER — Other Ambulatory Visit: Payer: Self-pay

## 2019-08-27 ENCOUNTER — Telehealth: Payer: Self-pay

## 2019-08-27 ENCOUNTER — Ambulatory Visit (INDEPENDENT_AMBULATORY_CARE_PROVIDER_SITE_OTHER): Payer: BC Managed Care – PPO | Admitting: Gastroenterology

## 2019-08-27 ENCOUNTER — Encounter: Payer: Self-pay | Admitting: Internal Medicine

## 2019-08-27 VITALS — BP 105/75 | HR 93 | Temp 97.5°F | Ht 64.0 in | Wt 131.4 lb

## 2019-08-27 DIAGNOSIS — Z8371 Family history of colonic polyps: Secondary | ICD-10-CM

## 2019-08-27 DIAGNOSIS — R1310 Dysphagia, unspecified: Secondary | ICD-10-CM

## 2019-08-27 DIAGNOSIS — K5900 Constipation, unspecified: Secondary | ICD-10-CM | POA: Diagnosis not present

## 2019-08-27 DIAGNOSIS — Z83719 Family history of colon polyps, unspecified: Secondary | ICD-10-CM | POA: Insufficient documentation

## 2019-08-27 MED ORDER — MOTEGRITY 2 MG PO TABS: 1.0000 | ORAL_TABLET | Freq: Every day | ORAL | 5 refills | Status: DC

## 2019-08-27 MED ORDER — SUPREP BOWEL PREP KIT 17.5-3.13-1.6 GM/177ML PO SOLN: 1.0000 | ORAL | 0 refills | Status: DC

## 2019-08-27 NOTE — Progress Notes (Signed)
Referring Provider: Ginger Organ Primary Care Physician:  Cory Munch, PA-C Primary GI: Dr. Gala Romney   Chief Complaint  Patient presents with  . Abdominal Pain    HPI:   Kristina Lewis is a 54 y.o. female presenting today with a history of GERD, dysphagia, constipation, Parkinson's disease diagnosed in 2015, followed by [facility]. Last EGD 2016 with patent esophagus s/p empiric dilatation. Last colonoscopy 2016 with internal hemorrhoids otherwise normal.    Some occasional low volume hematochezia. Will go 4 days without BM. More on constipated side. Has had difficulty with finding a good bowel regimen. Linzess 290: diarrhea. Thinks there was diarrhea with lower dosages as well. Amitiza 8 mcg: not productive stool. Amitiza 24 mcgpo BID: not helpful. Appetite not the est. Feels gassy, bloated. Lots of rumbling in stomach. Now with recurrent dysphagia to solid food. She noted improvement with dilation in 2016.      Past Medical History:  Diagnosis Date  . Anxiety   . Atypical chest pain 03/15/2017  . Bulging disc    CERVICAL  . Hemorrhoids   . IC (interstitial cystitis)    bladder stimulator 10/2012  . Left leg pain   . Palpitations 03/15/2017  . Parkinson's disease ([place])   . PONV (postoperative nausea and vomiting)   . PVC (premature ventricular contraction) 04/04/2017  . Seasonal allergies   . Urge incontinence     Past Surgical History:  Procedure Laterality Date  . BIOPSY N/A 10/27/2014   Procedure: BIOPSY;  Surgeon: Daneil Dolin, MD;  Location: [facility];  Service: Endoscopy;  Laterality: N/A;  duodenal, esophageal  . BREAST ENHANCEMENT SURGERY  AGE 24  . CESAREAN SECTION  12/2000  &  01-26-2003   X2  . COLONOSCOPY WITH PROPOFOL N/A 10/27/2014   Dr. Gala Romney: Minimal internal heorrhoids;otherwise normal rectum, colon and terminal ileum.   . cranial neurostimulator implant  04/2018   for deep brain simulation. [facility]  . CYSTO/ URETHRAL DILATION/ HOD  X2  LAST ONE  12-19-2004  . ESOPHAGEAL DILATION N/A 10/27/2014   Procedure: ESOPHAGEAL DILATION;  Surgeon: Daneil Dolin, MD;  Location: [facility];  Service: Endoscopy;  Laterality: N/AVenia Minks 18  . ESOPHAGOGASTRODUODENOSCOPY (EGD) WITH PROPOFOL N/A 10/27/2014   Dr. Gala Romney: Normal EGD. status post maloney dilation follwed by esophageal and duodenal biopsy. benign esophageal and duodenal biopsies  . HEMORRHOID BANDING  2016   Dr.Rourk  . INTERSTIM IMPLANT PLACEMENT N/A 10/08/2012   Procedure: Barrie Lyme IMPLANT FIRST STAGE;  Surgeon: Reece Packer, MD;  Location: [facility];  Service: Urology;  Laterality: N/A;  . INTERSTIM IMPLANT PLACEMENT N/A 10/08/2012   Procedure: Barrie Lyme IMPLANT SECOND STAGE;  Surgeon: Reece Packer, MD;  Location: [place] [facility];  Service: Urology;  Laterality: N/A;  . LASIK     bilateral  . TONSILLECTOMY  AGE 59'S    Current Outpatient Medications  Medication Sig Dispense Refill  . acetaminophen (TYLENOL) 500 MG tablet Take 1,000 mg by mouth every 8 (eight) hours as needed for pain.    Marland Kitchen ALPRAZolam (XANAX) 1 MG tablet Take 1.5 mg by mouth at bedtime.     . baclofen (LIORESAL) 10 MG tablet Take 10 mg by mouth 2 (two) times daily as needed for muscle spasms.     . Cholecalciferol (VITAMIN D PO) Take 1 tablet by mouth daily.     Marland Kitchen esomeprazole (NEXIUM) 40 MG capsule TAKE 1 CAPSULE DAILY BEFORE BREAKFAST (Patient taking differently:  Take 40 mg by mouth daily. ) 90 capsule 3  . FLUoxetine (PROZAC) 20 MG tablet Take 40 mg by mouth daily.     Marland Kitchen ibuprofen (ADVIL,MOTRIN) 200 MG tablet Take 800 mg by mouth every 6 (six) hours as needed for pain.     . Melatonin 3 MG TABS Take 3 mg by mouth at bedtime.    Marland Kitchen zonisamide (ZONEGRAN) 100 MG capsule Take 400 mg by mouth at bedtime.     . Na Sulfate-K Sulfate-Mg Sulf (SUPREP BOWEL PREP KIT) 17.5-3.13-1.6 GM/177ML SOLN Take 1 kit by mouth as directed. 354 mL 0  . Prucalopride Succinate (MOTEGRITY) 2 MG TABS Take 1  tablet (2 mg total) by mouth daily. 30 tablet 5   No current facility-administered medications for this visit.    Allergies as of 08/27/2019 - Review Complete 08/27/2019  Allergen Reaction Noted  . Other Diarrhea and Swelling 05/18/2014  . Tioconazole Itching and Swelling 12/16/2014  . Monistat [miconazole] Swelling 11/28/2011  . Tape Itching 10/04/2012    Family History  Problem Relation Age of Onset  . Diabetes Mother        Died, 26  . Colon polyps Mother        less than age 22  . Diabetes Father        Living, 45  . Cancer Father        prostate  . Hypertension Father   . Atrial fibrillation Father   . CAD Father   . Hypertension Maternal Grandmother   . Heart disease Maternal Grandmother   . Diabetes Paternal Grandfather   . Breast cancer Maternal Aunt   . Cancer Cousin        MATERNAL  . Stroke Paternal Grandmother   . Colon cancer Neg Hx     Social History   Socioeconomic History  . Marital status: Married    Spouse name: Not on file  . Number of children: 3  . Years of education: Not on file  . Highest education level: Not on file  Occupational History  . Not on file  Tobacco Use  . Smoking status: Never Smoker  . Smokeless tobacco: Never Used  Substance and Sexual Activity  . Alcohol use: No    Alcohol/week: 0.0 standard drinks  . Drug use: No  . Sexual activity: Not on file  Other Topics Concern  . 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  She had three healthy children at home.  Husband is Environmental education officer.     Social Determinants of Health   Financial Resource Strain:   . Difficulty of Paying Living Expenses: Not on file  Food Insecurity:   . Worried About Charity fundraiser in the Last Year: Not on file  . Ran Out of Food in the Last Year: Not on file  Transportation Needs:   . Lack of Transportation (Medical): Not on file  . Lack of Transportation (Non-Medical): Not on file  Physical Activity:   . Days of Exercise per Week: Not  on file  . Minutes of Exercise per Session: Not on file  Stress:   . Feeling of Stress : Not on file  Social Connections:   . Frequency of Communication with Friends and Family: Not on file  . Frequency of Social Gatherings with Friends and Family: Not on file  . Attends Religious Services: Not on file  . Active Member of Clubs or Organizations: Not on file  . Attends Archivist Meetings:  Not on file  . Marital Status: Not on file    Review of Systems: Gen: see HPI CV: Denies chest pain, palpitations, syncope, peripheral edema, and claudication. Resp: Denies dyspnea at rest, cough, wheezing, coughing up blood, and pleurisy. GI: see HPI Derm: Denies rash, itching, dry skin Psych: Denies depression, anxiety, memory loss, confusion. No homicidal or suicidal ideation.  Heme: see HPI  Physical Exam: BP 105/75   Pulse 93   Temp (!) 97.5 F (36.4 C) (Temporal)   Ht 5' 4"  (1.626 m)   Wt 131 lb 6.4 oz (59.6 kg)   BMI 22.55 kg/m  General:   Alert and oriented. No distress noted. Pleasant and cooperative.  Head:  Normocephalic and atraumatic. Eyes:  Conjuctiva clear without scleral icterus. Lungs: clear bilaterally Cardiac: S1 S2 present without murmurs Abdomen:  +BS, soft, non-tender and non-distended. No rebound or guarding. No HSM or masses noted. Msk:  Symmetrical without gross deformities. Normal posture. Extremities:  Without edema. Neurologic:  Alert and  oriented x4 Psych:  Alert and cooperative. Normal mood and affect.  ASSESSMENT: Kristina Lewis is a 54 y.o. female presenting today with history of GERD, dysphagia, chronic constipation, Parkinson's disease diagnosed in 2015, now with recurrent solid food dysphagia, persistent constipation, and low-volume hematochezia.  Solid food dysphagia: likely multifactorial in setting of GERD and known Parkinson's. Prior improvement with dilation in 2016. Will pursue EGD/dilation in near future.  Constipation: not ideally  managed with Linzess or Amitiza. Will trial Motegrity, add Benefiber. Low-volume hematochezia likely benign anorectal source in setting of constipation. As last colonoscopy in 2016, will pursue updated now. She tells me that her mother had colon polyps less than age 81.    PLAN:   Proceed with TCS/EGD/dilation with Dr. Gala Romney in near future: the risks, benefits, and alternatives have been discussed with the patient in detail. The patient states understanding and desires to proceed. Propofol due to polypharmacy  Start Motegrity. Add Benefiber  Continue Nexium 40 mg daily  Follow-up in 6 months  Annitta Needs, PhD, [facility]

## 2019-08-27 NOTE — Telephone Encounter (Signed)
Called pt, TCS/EGD/DIL w/Propofol w/RMR scheduled for 11/13/19 at 12:15pm. Rx for prep sent to pharmacy. Orders entered.

## 2019-08-27 NOTE — Patient Instructions (Signed)
I sent Motegrity to your local pharmacy so you can see how much it will be with the savings card. We don't have samples here but have ordered some. If you would rather wait on samples, let us know!  I recommend Benefiber 2 teaspoons daily, working up to three times a day if needed. Start with the lower dosage and see how you do.  We are arranging a colonoscopy, upper endoscopy, and dilation in the near future!  I enjoyed seeing you again today! As you know, I value our relationship and want to provide genuine, compassionate, and quality care. I welcome your feedback. If you receive a survey regarding your visit,  I greatly appreciate you taking time to fill this out. See you next time!  Gelene Mink, PhD, ANP-BC [facility]

## 2019-08-29 DIAGNOSIS — Z20822 Contact with and (suspected) exposure to covid-19: Secondary | ICD-10-CM | POA: Diagnosis not present

## 2019-08-31 ENCOUNTER — Encounter: Payer: Self-pay | Admitting: Gastroenterology

## 2019-09-01 ENCOUNTER — Encounter: Payer: Self-pay | Admitting: *Deleted

## 2019-09-08 DIAGNOSIS — M79675 Pain in left toe(s): Secondary | ICD-10-CM | POA: Diagnosis not present

## 2019-09-08 DIAGNOSIS — S92912A Unspecified fracture of left toe(s), initial encounter for closed fracture: Secondary | ICD-10-CM | POA: Diagnosis not present

## 2019-10-09 DIAGNOSIS — G20 Parkinson's disease: Secondary | ICD-10-CM | POA: Diagnosis not present

## 2019-10-09 DIAGNOSIS — Z9689 Presence of other specified functional implants: Secondary | ICD-10-CM | POA: Diagnosis not present

## 2019-10-09 DIAGNOSIS — Z8669 Personal history of other diseases of the nervous system and sense organs: Secondary | ICD-10-CM | POA: Diagnosis not present

## 2019-11-04 ENCOUNTER — Telehealth: Payer: Self-pay | Admitting: Internal Medicine

## 2019-11-04 NOTE — Telephone Encounter (Signed)
Called patient and confirmed she wants to cancel procedure for now. She will call back to r/s.

## 2019-11-04 NOTE — Telephone Encounter (Signed)
Patient needs to cancel her upcoming procedure, changing insurance

## 2019-11-04 NOTE — Telephone Encounter (Signed)
FYI to AB.  

## 2019-11-07 ENCOUNTER — Telehealth (HOSPITAL_COMMUNITY): Payer: Self-pay | Admitting: *Deleted

## 2019-11-10 ENCOUNTER — Encounter (HOSPITAL_COMMUNITY): Admission: RE | Admit: 2019-11-10 | Payer: BC Managed Care – PPO | Source: Ambulatory Visit

## 2019-11-10 ENCOUNTER — Other Ambulatory Visit (HOSPITAL_COMMUNITY): Payer: BC Managed Care – PPO

## 2019-11-11 DIAGNOSIS — N3010 Interstitial cystitis (chronic) without hematuria: Secondary | ICD-10-CM | POA: Diagnosis not present

## 2019-11-11 DIAGNOSIS — R350 Frequency of micturition: Secondary | ICD-10-CM | POA: Diagnosis not present

## 2019-11-13 ENCOUNTER — Encounter (HOSPITAL_COMMUNITY): Admission: RE | Payer: Self-pay | Source: Home / Self Care

## 2019-11-13 ENCOUNTER — Ambulatory Visit (HOSPITAL_COMMUNITY)
Admission: RE | Admit: 2019-11-13 | Payer: BC Managed Care – PPO | Source: Home / Self Care | Admitting: Internal Medicine

## 2019-11-13 SURGERY — COLONOSCOPY WITH PROPOFOL
Anesthesia: Monitor Anesthesia Care

## 2019-12-29 ENCOUNTER — Telehealth: Payer: Self-pay | Admitting: Internal Medicine

## 2019-12-29 NOTE — Telephone Encounter (Signed)
Pt would like a refill of Nexium 40 mg daily #90 day supply sent to [facility].

## 2019-12-29 NOTE — Telephone Encounter (Signed)
Patient called to see if AB would call in a prescription of her generic Nexium to [facility] pharmacy on [address] in [place] and make it more a 90 day supply. [phone]

## 2019-12-30 MED ORDER — ESOMEPRAZOLE MAGNESIUM 40 MG PO CPDR: DELAYED_RELEASE_CAPSULE | ORAL | 3 refills | Status: DC

## 2019-12-30 NOTE — Telephone Encounter (Signed)
Completed.

## 2019-12-30 NOTE — Addendum Note (Signed)
Addended by: Gelene Mink on: 12/30/2019 01:07 PM   Modules accepted: Orders

## 2020-02-23 NOTE — Progress Notes (Deleted)
with a history of GERD, dysphagia, constipation, Parkinson's disease diagnosed in 2015, followed by [facility]. Last EGD 2016 with patent esophagus s/p empiric dilatation. Last colonoscopy 2016 with internal hemorrhoids otherwise normal.   Linzess 290: diarrhea. Thinks there was diarrhea with lower dosages as well. Amitiza 8 mcg: not productive stool. Amitiza 24 mcgpo BID: not helpful. Trial of Motegrity Feb 2021   Dysphagia at last appt. Needs EGD/dilation

## 2020-02-24 ENCOUNTER — Ambulatory Visit: Payer: BC Managed Care – PPO | Admitting: Gastroenterology

## 2020-02-24 ENCOUNTER — Encounter: Payer: Self-pay | Admitting: Internal Medicine

## 2020-07-07 ENCOUNTER — Ambulatory Visit
Admission: EM | Admit: 2020-07-07 | Discharge: 2020-07-07 | Disposition: A | Discharge status: Home / Self Care | Payer: 59 | Attending: Emergency Medicine | Admitting: Emergency Medicine

## 2020-07-07 DIAGNOSIS — U071 COVID-19: Secondary | ICD-10-CM

## 2020-07-07 DIAGNOSIS — Z20822 Contact with and (suspected) exposure to covid-19: Secondary | ICD-10-CM | POA: Diagnosis not present

## 2020-07-07 DIAGNOSIS — R0602 Shortness of breath: Secondary | ICD-10-CM | POA: Diagnosis not present

## 2020-07-07 MED ORDER — BENZONATATE 100 MG PO CAPS: 100.0000 mg | ORAL_CAPSULE | Freq: Three times a day (TID) | ORAL | 0 refills | Status: DC | PRN

## 2020-07-07 MED ORDER — ALBUTEROL SULFATE HFA 108 (90 BASE) MCG/ACT IN AERS: 1.0000 | INHALATION_SPRAY | Freq: Four times a day (QID) | RESPIRATORY_TRACT | 1 refills | Status: DC | PRN

## 2020-07-07 NOTE — Discharge Instructions (Signed)
Get plenty of rest and push fluids Tessalon Perles prescribed for cough Albuterol as prescribed Continue prednisone as prescribed and directed by PCP Use medications daily for symptom relief Use OTC medications like ibuprofen or tylenol as needed fever or pain Call or go to the ED if you have any new or worsening symptoms such as fever, worsening cough, shortness of breath, chest tightness, chest pain, turning blue, changes in mental status, etc..Marland Kitchen

## 2020-07-07 NOTE — ED Provider Notes (Signed)
[place]   [id number] 07/07/20 Arrival Time: 1602   CC: COVID infections  SUBJECTIVE: History from: patient.  Kristina Lewis is a 55 y.o. female who presented to the urgent care with a complaint of being COVID positive for the past few days.  Patient is reporting cough, congestion, chest tightness and shortness of breath.  She was prescribed prednisone, ivermectin, aspirin and azithromycin by PCP.  Reports symptom has been getting worse.  Denies sick exposure to  flu or strep.  Denies recent travel.  Denies previous symptoms in the past.   Denies fever, chills, fatigue, sinus pain, rhinorrhea, sore throat, wheezing, chest pain, nausea, changes in bowel or bladder habits.    ROS: As per HPI.  All other pertinent ROS negative.       Past Medical History:  Diagnosis Date  . Anxiety   . Atypical chest pain 03/15/2017  . Bulging disc    CERVICAL  . Hemorrhoids   . IC (interstitial cystitis)    bladder stimulator 10/2012  . Left leg pain   . Palpitations 03/15/2017  . Parkinson's disease ([place])   . PONV (postoperative nausea and vomiting)   . PVC (premature ventricular contraction) 04/04/2017  . Seasonal allergies   . Urge incontinence    Past Surgical History:  Procedure Laterality Date  . BIOPSY N/A 10/27/2014   Procedure: BIOPSY;  Surgeon: Daneil Dolin, MD;  Location: [facility];  Service: Endoscopy;  Laterality: N/A;  duodenal, esophageal  . BREAST ENHANCEMENT SURGERY  AGE 89  . CESAREAN SECTION  12/2000  &  01-26-2003   X2  . COLONOSCOPY WITH PROPOFOL N/A 10/27/2014   Dr. Gala Romney: Minimal internal heorrhoids;otherwise normal rectum, colon and terminal ileum.   . cranial neurostimulator implant  04/2018   for deep brain simulation. [facility]  . CYSTO/ URETHRAL DILATION/ HOD  X2  LAST ONE 12-19-2004  . ESOPHAGEAL DILATION N/A 10/27/2014   Procedure: ESOPHAGEAL DILATION;  Surgeon: Daneil Dolin, MD;  Location: [facility];  Service: Endoscopy;  Laterality: N/AVenia Minks 45  .  ESOPHAGOGASTRODUODENOSCOPY (EGD) WITH PROPOFOL N/A 10/27/2014   Dr. Gala Romney: Normal EGD. status post maloney dilation follwed by esophageal and duodenal biopsy. benign esophageal and duodenal biopsies  . HEMORRHOID BANDING  2016   Dr.Rourk  . INTERSTIM IMPLANT PLACEMENT N/A 10/08/2012   Procedure: Barrie Lyme IMPLANT FIRST STAGE;  Surgeon: Reece Packer, MD;  Location[facility];  Service: Urology;  Laterality: N/A;  . INTERSTIM IMPLANT PLACEMENT N/A 10/08/2012   Procedure: Barrie Lyme IMPLANT SECOND STAGE;  Surgeon: Reece Packer, MD;  Location: [place];  Service: Urology;  Laterality: N/A;  . LASIK     bilateral  . TONSILLECTOMY  AGE 81'S   Allergies  Allergen Reactions  . Other Diarrhea and Swelling    celery and tomatoes  . Tioconazole Itching and Swelling    Other reaction(s): Other (See Comments) swelling   . Monistat [Miconazole] Swelling  . Tape Itching    VERY IRRITATED Paper tape is Ok   No current facility-administered medications on file prior to encounter.   Current Outpatient Medications on File Prior to Encounter  Medication Sig Dispense Refill  . acetaminophen (TYLENOL) 500 MG tablet Take 1,000 mg by mouth every 8 (eight) hours as needed for pain.    Marland Kitchen ALPRAZolam (XANAX) 1 MG tablet Take 1 mg by mouth at bedtime.     . baclofen (LIORESAL) 10 MG tablet Take 10 mg by mouth 2 (two)  times daily as needed for muscle spasms.     . cetirizine (ZYRTEC) 10 MG tablet Take 10 mg by mouth daily.    . Cholecalciferol (VITAMIN D PO) Take 1 tablet by mouth daily.     . diclofenac Sodium (VOLTAREN) 1 % GEL Apply 1 application topically 3 (three) times daily as needed (pain).    Marland Kitchen esomeprazole (NEXIUM) 40 MG capsule TAKE 1 CAPSULE DAILY BEFORE BREAKFAST 90 capsule 3  . FLUoxetine (PROZAC) 20 MG tablet Take 40 mg by mouth daily.     . fluticasone (FLONASE) 50 MCG/ACT nasal spray Place 1 spray into both nostrils daily.    Marland Kitchen ibuprofen  (ADVIL,MOTRIN) 200 MG tablet Take 800 mg by mouth every 6 (six) hours as needed for pain.     . Melatonin 3 MG TABS Take 3 mg by mouth at bedtime.    . Na Sulfate-K Sulfate-Mg Sulf (SUPREP BOWEL PREP KIT) 17.5-3.13-1.6 GM/177ML SOLN Take 1 kit by mouth as directed. 354 mL 0  . Prucalopride Succinate (MOTEGRITY) 2 MG TABS Take 1 tablet (2 mg total) by mouth daily. (Patient not taking: Reported on 10/27/2019) 30 tablet 5  . Tetrahydrozoline-Zn Sulfate (ALLERGY RELIEF EYE DROPS OP) Place 1 drop into both eyes 2 (two) times daily as needed (allergies).    . vitamin B-12 (CYANOCOBALAMIN) 1000 MCG tablet Take 1,000 mcg by mouth daily.    Marland Kitchen zonisamide (ZONEGRAN) 100 MG capsule Take 400 mg by mouth at bedtime.      Social History   Socioeconomic History  . Marital status: Married    Spouse name: Not on file  . Number of children: 3  . Years of education: Not on file  . Highest education level: Not on file  Occupational History  . Not on file  Tobacco Use  . Smoking status: Never Smoker  . Smokeless tobacco: Never Used  Vaping Use  . Vaping Use: Never used  Substance and Sexual Activity  . Alcohol use: No    Alcohol/week: 0.0 standard drinks  . Drug use: No  . Sexual activity: Not on file  Other Topics Concern  . 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  She had three healthy children at home.  Husband is Environmental education officer.     Social Determinants of Health   Financial Resource Strain: Not on file  Food Insecurity: Not on file  Transportation Needs: Not on file  Physical Activity: Not on file  Stress: Not on file  Social Connections: Not on file  Intimate Partner Violence: Not on file   Family History  Problem Relation Age of Onset  . Diabetes Mother        Died, 67  . Colon polyps Mother        less than age 11  . Diabetes Father        Living, 75  . Cancer Father        prostate  . Hypertension Father   . Atrial fibrillation Father   . CAD Father   . Hypertension  Maternal Grandmother   . Heart disease Maternal Grandmother   . Diabetes Paternal Grandfather   . Breast cancer Maternal Aunt   . Cancer Cousin        MATERNAL  . Stroke Paternal Grandmother   . Colon cancer Neg Hx     OBJECTIVE:  Vitals:   07/07/20 1616  BP: 108/73  Pulse: 86  Resp: 19  Temp: 99 F (37.2 C)  SpO2: 94%  General appearance: alert; appears fatigued, but nontoxic; speaking in full sentences and tolerating own secretions HEENT: NCAT; Ears: EACs clear, TMs pearly gray; Eyes: PERRL.  EOM grossly intact. Sinuses: nontender; Nose: nares patent without rhinorrhea, Throat: oropharynx clear, tonsils non erythematous or enlarged, uvula midline  Neck: supple without LAD Lungs: unlabored respirations, symmetrical air entry; cough: moderate; no respiratory distress; CTAB Heart: regular rate and rhythm.  Radial pulses 2+ symmetrical bilaterally Skin: warm and dry Psychological: alert and cooperative; normal mood and affect  LABS:  No results found for this or any previous visit (from the past 24 hour(s)).   ASSESSMENT & PLAN:  1. COVID-19 virus infection   2. Shortness of breath with exposure to COVID-19 virus     Meds ordered this encounter  Medications  . albuterol (VENTOLIN HFA) 108 (90 Base) MCG/ACT inhaler    Sig: Inhale 1-2 puffs into the lungs every 6 (six) hours as needed for wheezing or shortness of breath.    Dispense:  18 g    Refill:  1  . benzonatate (TESSALON) 100 MG capsule    Sig: Take 1 capsule (100 mg total) by mouth 3 (three) times daily as needed for cough.    Dispense:  30 capsule    Refill:  0    Discharge instructions  Get plenty of rest and push fluids Tessalon Perles prescribed for cough Albuterol as prescribed Continue prednisone as prescribed and directed by PCP Use medications daily for symptom relief Use OTC medications like ibuprofen or tylenol as needed fever or pain Call or go to the ED if you have any new or worsening  symptoms such as fever, worsening cough, shortness of breath, chest tightness, chest pain, turning blue, changes in mental status, etc...   Reviewed expectations re: course of current medical issues. Questions answered. Outlined signs and symptoms indicating need for more acute intervention. Patient verbalized understanding. After Visit Summary given.         Emerson Monte, [place] 07/07/20 1742

## 2020-07-07 NOTE — ED Triage Notes (Addendum)
Pt presents with complaints of being positive for COVID, reports that she is having continued shortness of breath, cough, generalized weakness, and concern for dehydration. Symptoms started 1 week ago today.   Reports she was given these medications on 1/3 Prednisone Ivermectin Aspirin Azithromycin  States she just started the prednisone today.

## 2020-07-09 ENCOUNTER — Emergency Department (HOSPITAL_COMMUNITY)
Admission: EM | Admit: 2020-07-09 | Discharge: 2020-07-10 | Disposition: A | Discharge status: Home / Self Care | Payer: 59 | Attending: Emergency Medicine | Admitting: Emergency Medicine

## 2020-07-09 ENCOUNTER — Other Ambulatory Visit: Payer: Self-pay

## 2020-07-09 ENCOUNTER — Encounter (HOSPITAL_COMMUNITY): Payer: Self-pay | Admitting: Emergency Medicine

## 2020-07-09 ENCOUNTER — Emergency Department (HOSPITAL_COMMUNITY): Payer: 59

## 2020-07-09 DIAGNOSIS — R531 Weakness: Secondary | ICD-10-CM | POA: Insufficient documentation

## 2020-07-09 DIAGNOSIS — Z5321 Procedure and treatment not carried out due to patient leaving prior to being seen by health care provider: Secondary | ICD-10-CM | POA: Insufficient documentation

## 2020-07-09 LAB — URINALYSIS, ROUTINE W REFLEX MICROSCOPIC
Bacteria, UA: NONE SEEN
Bilirubin Urine: NEGATIVE
Glucose, UA: NEGATIVE mg/dL
Hgb urine dipstick: NEGATIVE
Ketones, ur: NEGATIVE mg/dL
Leukocytes,Ua: NEGATIVE
Nitrite: NEGATIVE
Protein, ur: 30 mg/dL — AB
Specific Gravity, Urine: 1.011 (ref 1.005–1.030)
pH: 7 (ref 5.0–8.0)

## 2020-07-09 LAB — CBC
HCT: 41.6 % (ref 36.0–46.0)
Hemoglobin: 13.7 g/dL (ref 12.0–15.0)
MCH: 30.2 pg (ref 26.0–34.0)
MCHC: 32.9 g/dL (ref 30.0–36.0)
MCV: 91.8 fL (ref 80.0–100.0)
Platelets: 357 10*3/uL (ref 150–400)
RBC: 4.53 MIL/uL (ref 3.87–5.11)
RDW: 13.1 % (ref 11.5–15.5)
WBC: 7.7 10*3/uL (ref 4.0–10.5)
nRBC: 0 % (ref 0.0–0.2)

## 2020-07-09 LAB — BASIC METABOLIC PANEL
Anion gap: 12 (ref 5–15)
BUN: 10 mg/dL (ref 6–20)
CO2: 23 mmol/L (ref 22–32)
Calcium: 9.1 mg/dL (ref 8.9–10.3)
Chloride: 95 mmol/L — ABNORMAL LOW (ref 98–111)
Creatinine, Ser: 1.06 mg/dL — ABNORMAL HIGH (ref 0.44–1.00)
GFR, Estimated: >60 mL/min (ref >60)
Glucose, Bld: 98 mg/dL (ref 70–99)
Potassium: 3.7 mmol/L (ref 3.5–5.1)
Sodium: 130 mmol/L — ABNORMAL LOW (ref 135–145)

## 2020-07-09 LAB — TROPONIN I (HIGH SENSITIVITY)
Troponin I (High Sensitivity): 11 ng/L (ref <18)
Troponin I (High Sensitivity): 10 ng/L (ref <18)

## 2020-07-09 LAB — I-STAT BETA HCG BLOOD, ED (MC, WL, AP ONLY): I-stat hCG, quantitative: <5 m[IU]/mL (ref <5)

## 2020-07-09 NOTE — ED Triage Notes (Signed)
Pt reports feeling worse with COVID sx. Reports generalized weakness. VSS.

## 2020-07-09 NOTE — ED Notes (Signed)
Unable to obtain EKG due to brain stimulator.

## 2020-07-16 ENCOUNTER — Telehealth (HOSPITAL_COMMUNITY): Payer: Self-pay

## 2020-11-28 ENCOUNTER — Ambulatory Visit
Admission: EM | Admit: 2020-11-28 | Discharge: 2020-11-28 | Disposition: A | Discharge status: Home / Self Care | Payer: BC Managed Care – PPO | Attending: Family Medicine | Admitting: Family Medicine

## 2020-11-28 ENCOUNTER — Encounter: Payer: Self-pay | Admitting: Emergency Medicine

## 2020-11-28 ENCOUNTER — Other Ambulatory Visit: Payer: Self-pay

## 2020-11-28 DIAGNOSIS — B349 Viral infection, unspecified: Secondary | ICD-10-CM | POA: Insufficient documentation

## 2020-11-28 DIAGNOSIS — N3010 Interstitial cystitis (chronic) without hematuria: Secondary | ICD-10-CM | POA: Diagnosis present

## 2020-11-28 DIAGNOSIS — R300 Dysuria: Secondary | ICD-10-CM | POA: Diagnosis present

## 2020-11-28 LAB — POCT URINALYSIS DIP (MANUAL ENTRY)
Bilirubin, UA: NEGATIVE
Blood, UA: NEGATIVE
Glucose, UA: NEGATIVE mg/dL
Ketones, POC UA: NEGATIVE mg/dL
Leukocytes, UA: NEGATIVE
Nitrite, UA: NEGATIVE
Protein Ur, POC: NEGATIVE mg/dL
Spec Grav, UA: 1.015 (ref 1.010–1.025)
Urobilinogen, UA: 0.2 E.U./dL
pH, UA: 7.5 (ref 5.0–8.0)

## 2020-11-28 NOTE — ED Triage Notes (Signed)
Sore throat, sneezing, cough since Thursday.  Frequent and urgent urination x 2 weeks.  Pain on urination and lower abd pain

## 2020-11-28 NOTE — Discharge Instructions (Signed)
You may have a urinary tract infection.   We are going to culture your urine and will call you as soon as we have the results.   Drink plenty of water, 8-10 glasses per day.   You may take AZO over the counter for painful urination.  Your COVID and Influenza tests are pending.  You should self quarantine until the test results are back.    Take Tylenol or ibuprofen as needed for fever or discomfort.  Rest and keep yourself hydrated.    Follow-up with your primary care provider if your symptoms are not improving.

## 2020-11-28 NOTE — ED Provider Notes (Signed)
[facility]    CSN: [id number] Arrival date & time: 11/28/20  1220      History   Chief Complaint No chief complaint on file.   HPI Kristina Lewis is a 55 y.o. female.   Reports sore throat, sneezing and cough for the last 4 days.  States that she works at [employer] so she is not sure if anyone is actually sick around her or not.  Has positive history of COVID.  Has not completed COVID vaccines.  Has completed flu vaccine.  Also reports dysuria and urinary frequency for the last 2 weeks.  Has history of interstitial cystitis.  Has spinal neurostimulator to help with IC pain.  Has not taken anything over-the-counter for her symptoms at this time.  Denies shortness of breath, abdominal pain, nausea, vomiting, diarrhea, rash, fever, other symptoms.  ROS per HPI   The history is provided by the patient.    Past Medical History:  Diagnosis Date  . Anxiety   . Atypical chest pain 03/15/2017  . Bulging disc    CERVICAL  . Hemorrhoids   . IC (interstitial cystitis)    bladder stimulator 10/2012  . Left leg pain   . Palpitations 03/15/2017  . Parkinson's disease ([place])   . PONV (postoperative nausea and vomiting)   . PVC (premature ventricular contraction) 04/04/2017  . Seasonal allergies   . Urge incontinence     Patient Active Problem List   Diagnosis Date Noted  . Dysphagia 08/27/2019  . Family history of polyps in the colon 08/27/2019  . Pneumoperitoneum 08/12/2019  . Dyspepsia 08/13/2018  . PVC (premature ventricular contraction) 04/04/2017  . Palpitations 03/15/2017  . Atypical chest pain 03/15/2017  . Globus sensation 05/17/2016  . Abdominal pain 05/17/2016  . First degree hemorrhoids   . Loss of weight 10/15/2014  . GERD (gastroesophageal reflux disease) 10/15/2014  . Dysphagia, pharyngoesophageal phase 10/14/2014  . Rectal bleeding 10/14/2014  . External hemorrhoid 08/01/2012  . Constipation 08/01/2012  . Dysmenorrhea 02/15/2012  . Leukorrhea  02/15/2012  . Chronic headaches 02/15/2012  . Perimenopausal vasomotor symptoms 11/28/2011  . IC (interstitial cystitis) 08/17/2011    Past Surgical History:  Procedure Laterality Date  . BIOPSY N/A 10/27/2014   Procedure: BIOPSY;  Surgeon: Daneil Dolin, MD;  Location: [facility];  Service: Endoscopy;  Laterality: N/A;  duodenal, esophageal  . BREAST ENHANCEMENT SURGERY  AGE 13  . CESAREAN SECTION  12/2000  &  01-26-2003   X2  . COLONOSCOPY WITH PROPOFOL N/A 10/27/2014   Dr. Gala Romney: Minimal internal heorrhoids;otherwise normal rectum, colon and terminal ileum.   . cranial neurostimulator implant  04/2018   for deep brain simulation. [facility]  . CYSTO/ URETHRAL DILATION/ HOD  X2  LAST ONE 12-19-2004  . ESOPHAGEAL DILATION N/A 10/27/2014   Procedure: ESOPHAGEAL DILATION;  Surgeon: Daneil Dolin, MD;  Location: [facility];  Service: Endoscopy;  Laterality: N/AVenia Minks 18  . ESOPHAGOGASTRODUODENOSCOPY (EGD) WITH PROPOFOL N/A 10/27/2014   Dr. Gala Romney: Normal EGD. status post maloney dilation follwed by esophageal and duodenal biopsy. benign esophageal and duodenal biopsies  . HEMORRHOID BANDING  2016   Dr.Rourk  . INTERSTIM IMPLANT PLACEMENT N/A 10/08/2012   Procedure: Barrie Lyme IMPLANT FIRST STAGE;  Surgeon: Reece Packer, MD;  Location: [facility];  Service: Urology;  Laterality: N/A;  . INTERSTIM IMPLANT PLACEMENT N/A 10/08/2012   Procedure: Barrie Lyme IMPLANT SECOND STAGE;  Surgeon: Reece Packer, MD;  Location: [facility];  Service: Urology;  Laterality: N/A;  . LASIK     bilateral  . TONSILLECTOMY  AGE 24'S    OB History    Gravida  3   Para  2   Term  2   Preterm      AB      Living  3     SAB      IAB      Ectopic      Multiple  1   Live Births  3            Home Medications    Prior to Admission medications   Medication Sig Start Date End Date Taking? Authorizing Provider  acetaminophen (TYLENOL) 500 MG tablet Take 1,000 mg  by mouth every 8 (eight) hours as needed for pain.    [provider]  albuterol (VENTOLIN HFA) 108 (90 Base) MCG/ACT inhaler Inhale 1-2 puffs into the lungs every 6 (six) hours as needed for wheezing or shortness of breath. 07/07/20   Avegno, Darrelyn Hillock, FNP  ALPRAZolam Duanne Moron) 1 MG tablet Take 1 mg by mouth at bedtime.     [provider]  baclofen (LIORESAL) 10 MG tablet Take 10 mg by mouth 2 (two) times daily as needed for muscle spasms.     [provider]  benzonatate (TESSALON) 100 MG capsule Take 1 capsule (100 mg total) by mouth 3 (three) times daily as needed for cough. 07/07/20   Avegno, Darrelyn Hillock, FNP  cetirizine (ZYRTEC) 10 MG tablet Take 10 mg by mouth daily.    [provider]  Cholecalciferol (VITAMIN D PO) Take 1 tablet by mouth daily.     [provider]  diclofenac Sodium (VOLTAREN) 1 % GEL Apply 1 application topically 3 (three) times daily as needed (pain).    [provider]  esomeprazole (NEXIUM) 40 MG capsule TAKE 1 CAPSULE DAILY BEFORE BREAKFAST 12/30/19   Annitta Needs, NP  FLUoxetine (PROZAC) 20 MG tablet Take 40 mg by mouth daily.     [provider]  fluticasone (FLONASE) 50 MCG/ACT nasal spray Place 1 spray into both nostrils daily.    [provider]  ibuprofen (ADVIL,MOTRIN) 200 MG tablet Take 800 mg by mouth every 6 (six) hours as needed for pain.     [provider]  Melatonin 3 MG TABS Take 3 mg by mouth at bedtime. 01/21/18   [provider]  Na Sulfate-K Sulfate-Mg Sulf (SUPREP BOWEL PREP KIT) 17.5-3.13-1.6 GM/177ML SOLN Take 1 kit by mouth as directed. 08/27/19   Rourk, Cristopher Estimable, MD  Prucalopride Succinate (MOTEGRITY) 2 MG TABS Take 1 tablet (2 mg total) by mouth daily. Patient not taking: Reported on 10/27/2019 08/27/19   Annitta Needs, NP  Tetrahydrozoline-Zn Sulfate (ALLERGY RELIEF EYE DROPS OP) Place 1 drop into both eyes 2 (two) times daily as needed (allergies).    [provider]  vitamin B-12 (CYANOCOBALAMIN) 1000 MCG tablet Take 1,000 mcg by mouth daily.    [provider]  zonisamide (ZONEGRAN) 100 MG capsule Take 400 mg by mouth at bedtime.     [provider]    Family History Family History  Problem Relation Age of Onset  . Diabetes Mother        Died, 81  . Colon polyps Mother        less than age 13  . Diabetes Father        Living, 50  . Cancer Father  prostate  . Hypertension Father   . Atrial fibrillation Father   . CAD Father   . Hypertension Maternal Grandmother   . Heart disease Maternal Grandmother   . Diabetes Paternal Grandfather   . Breast cancer Maternal Aunt   . Cancer Cousin        MATERNAL  . Stroke Paternal Grandmother   . Colon cancer Neg Hx     Social History Social History   Tobacco Use  . Smoking status: Never Smoker  . Smokeless tobacco: Never Used  Vaping Use  . Vaping Use: Never used  Substance Use Topics  . Alcohol use: No    Alcohol/week: 0.0 standard drinks  . Drug use: No     Allergies   Other, Tioconazole, Monistat [miconazole], and Tape   Review of Systems Review of Systems   Physical Exam Triage Vital Signs ED Triage Vitals  Enc Vitals Group     BP 11/28/20 1233 94/61     Pulse Rate 11/28/20 1233 71     Resp 11/28/20 1233 16     Temp 11/28/20 1233 (!) 97.5 F (36.4 C)     Temp Source 11/28/20 1233 Oral     SpO2 11/28/20 1233 95 %     Weight --      Height --      Head Circumference --      Peak Flow --      Pain Score 11/28/20 1235 4     Pain Loc --      Pain Edu? --      Excl. in [place]? --    No data found.  Updated Vital Signs BP 94/61 (BP Location: Right Arm)   Pulse 71   Temp (!) 97.5 F (36.4 C) (Oral)   Resp 16   LMP 04/07/2016   SpO2 95%       Physical Exam Vitals and nursing note reviewed.  Constitutional:      General: She is not in acute distress.    Appearance: Normal appearance. She is well-developed and normal weight.   HENT:     Head: Normocephalic and atraumatic.     Right Ear: Tympanic membrane, ear canal and external ear normal.     Left Ear: Tympanic membrane, ear canal and external ear normal.     Nose: Congestion present.     Mouth/Throat:     Mouth: Mucous membranes are moist.     Pharynx: Oropharynx is clear.  Eyes:     Extraocular Movements: Extraocular movements intact.     Conjunctiva/sclera: Conjunctivae normal.     Pupils: Pupils are equal, round, and reactive to light.  Cardiovascular:     Rate and Rhythm: Normal rate and regular rhythm.     Heart sounds: Normal heart sounds. No murmur heard.   Pulmonary:     Effort: Pulmonary effort is normal. No respiratory distress.     Breath sounds: Normal breath sounds. No stridor. No wheezing, rhonchi or rales.     Comments: Dry cough present in office  Chest:     Chest wall: No tenderness.  Abdominal:     General: There is no distension.     Palpations: Abdomen is soft. There is no mass.     Tenderness: There is abdominal tenderness (suprapubic). There is no right CVA tenderness, left CVA tenderness, guarding or rebound.     Hernia: No hernia is present.  Musculoskeletal:        General: Normal range of motion.  Cervical back: Normal range of motion and neck supple.  Lymphadenopathy:     Cervical: Cervical adenopathy present.  Skin:    General: Skin is warm and dry.     Capillary Refill: Capillary refill takes less than 2 seconds.  Neurological:     General: No focal deficit present.     Mental Status: She is alert and oriented to person, place, and time.  Psychiatric:        Mood and Affect: Mood normal.        Behavior: Behavior normal.        Thought Content: Thought content normal.      UC Treatments / Results  Labs (all labs ordered are listed, but only abnormal results are displayed) Labs Reviewed  POCT URINALYSIS DIP (MANUAL ENTRY) - Abnormal; Notable for the following components:      Result Value   Clarity,  UA cloudy (*)    All other components within normal limits  URINE CULTURE  COVID-19, FLU A+B NAA    EKG   Radiology No results found.  Procedures Procedures (including critical care time)  Medications Ordered in UC Medications - No data to display  Initial Impression / Assessment and Plan / UC Course  I have reviewed the triage vital signs and the nursing notes.  Pertinent labs & imaging results that were available during my care of the patient were reviewed by me and considered in my medical decision making (see chart for details).    Viral illness Dysuria Interstitial cystitis  UA cloudy, but on remarkable for infection Will culture given as a history May take Azo over-the-counter as needed for dysuria Drink plenty of fluids May take ibuprofen and Tylenol as needed May take Delsym over-the-counter for cough Work note provided Covid and flu swab obtained in office today.   Patient instructed to quarantine until results are back and negative.   If results are negative, patient may resume daily schedule as tolerated once they are fever free for 24 hours without the use of antipyretic medications.   If results are positive, patient instructed to quarantine for at least 5 days from symptom onset.  If after 5 days symptoms have resolved, may return to work with a well fitting mask for the next 5 days. If symptomatic after day 5, isolation should be extended to 10 days. Patient instructed to follow-up with primary care or with this office as needed.   Patient instructed to follow-up in the ER for trouble swallowing, trouble breathing, other concerning symptoms.   Final Clinical Impressions(s) / UC Diagnoses   Final diagnoses:  Dysuria  Viral illness  IC (interstitial cystitis)     Discharge Instructions     You may have a urinary tract infection.   We are going to culture your urine and will call you as soon as we have the results.   Drink plenty of water, 8-10  glasses per day.   You may take AZO over the counter for painful urination.  Your COVID and Influenza tests are pending.  You should self quarantine until the test results are back.    Take Tylenol or ibuprofen as needed for fever or discomfort.  Rest and keep yourself hydrated.    Follow-up with your primary care provider if your symptoms are not improving.        ED Prescriptions    None     PDMP not reviewed this encounter.   Faustino Congress, NP 11/28/20 1311

## 2020-11-29 LAB — COVID-19, FLU A+B NAA
Influenza A, NAA: NOT DETECTED
Influenza B, NAA: NOT DETECTED
SARS-CoV-2, NAA: NOT DETECTED

## 2020-11-30 LAB — URINE CULTURE: Culture: <10000 — AB

## 2021-06-27 ENCOUNTER — Other Ambulatory Visit: Payer: Self-pay

## 2021-06-27 ENCOUNTER — Ambulatory Visit
Admission: RE | Admit: 2021-06-27 | Discharge: 2021-06-27 | Disposition: A | Discharge status: Home / Self Care | Payer: BC Managed Care – PPO | Source: Ambulatory Visit | Attending: Internal Medicine | Admitting: Internal Medicine

## 2021-06-27 VITALS — BP 112/73 | HR 71 | Temp 98.3°F | Resp 18

## 2021-06-27 DIAGNOSIS — J069 Acute upper respiratory infection, unspecified: Secondary | ICD-10-CM | POA: Diagnosis present

## 2021-06-27 DIAGNOSIS — J029 Acute pharyngitis, unspecified: Secondary | ICD-10-CM | POA: Insufficient documentation

## 2021-06-27 LAB — POCT RAPID STREP A (OFFICE): Rapid Strep A Screen: NEGATIVE

## 2021-06-27 MED ORDER — OSELTAMIVIR PHOSPHATE 75 MG PO CAPS: 75.0000 mg | ORAL_CAPSULE | Freq: Two times a day (BID) | ORAL | 0 refills | Status: DC

## 2021-06-27 MED ORDER — BENZONATATE 100 MG PO CAPS: 100.0000 mg | ORAL_CAPSULE | Freq: Three times a day (TID) | ORAL | 0 refills | Status: DC | PRN

## 2021-06-27 NOTE — Discharge Instructions (Signed)
It appears that you have a viral upper respiratory infection.  Your rapid strep was negative.  Throat culture, COVID-19, flu swabs are pending.  You have been prescribed Tamiflu and a cough medication.

## 2021-06-27 NOTE — ED Triage Notes (Signed)
Pt c/o cough, headache, body aches, sore throat, edematous lymph nodes in throat  Onset ~ 06/25/21

## 2021-06-27 NOTE — ED Provider Notes (Signed)
[facility]    CSN: [id number] Arrival date & time: 06/27/21  1533      History   Chief Complaint Chief Complaint  Patient presents with   Cough    HPI Kristina Lewis is a 55 y.o. female.   She presents with 2-day history of sore throat, nonproductive cough, nasal congestion, body aches.  Denies any known sick contacts.  Denies any fever but states that she "felt feverish".  Patient has not taken any medications to help alleviate symptoms other than ibuprofen.  Denies chest pain, shortness of breath, ear pain, nausea, vomiting, diarrhea, abdominal pain.   Cough  Past Medical History:  Diagnosis Date   Anxiety    Atypical chest pain 03/15/2017   Bulging disc    CERVICAL   Hemorrhoids    IC (interstitial cystitis)    bladder stimulator 10/2012   Left leg pain    Palpitations 03/15/2017   Parkinson's disease ([place])    PONV (postoperative nausea and vomiting)    PVC (premature ventricular contraction) 04/04/2017   Seasonal allergies    Urge incontinence     Patient Active Problem List   Diagnosis Date Noted   Dysphagia 08/27/2019   Family history of polyps in the colon 08/27/2019   Pneumoperitoneum 08/12/2019   Dyspepsia 08/13/2018   PVC (premature ventricular contraction) 04/04/2017   Palpitations 03/15/2017   Atypical chest pain 03/15/2017   Globus sensation 05/17/2016   Abdominal pain 05/17/2016   First degree hemorrhoids    Loss of weight 10/15/2014   GERD (gastroesophageal reflux disease) 10/15/2014   Dysphagia, pharyngoesophageal phase 10/14/2014   Rectal bleeding 10/14/2014   External hemorrhoid 08/01/2012   Constipation 08/01/2012   Dysmenorrhea 02/15/2012   Leukorrhea 02/15/2012   Chronic headaches 02/15/2012   Perimenopausal vasomotor symptoms 11/28/2011   IC (interstitial cystitis) 08/17/2011    Past Surgical History:  Procedure Laterality Date   BIOPSY N/A 10/27/2014   Procedure: BIOPSY;  Surgeon: Daneil Dolin, MD;  Location: [facility];  Service: Endoscopy;  Laterality: N/A;  duodenal, esophageal   BREAST ENHANCEMENT SURGERY  AGE 23   CESAREAN SECTION  12/2000  &  01-26-2003   X2   COLONOSCOPY WITH PROPOFOL N/A 10/27/2014   Dr. Gala Romney: Minimal internal heorrhoids;otherwise normal rectum, colon and terminal ileum.    cranial neurostimulator implant  04/2018   for deep brain simulation. [facility]   CYSTO/ URETHRAL DILATION/ HOD  X2  LAST ONE 12-19-2004   ESOPHAGEAL DILATION N/A 10/27/2014   Procedure: ESOPHAGEAL DILATION;  Surgeon: Daneil Dolin, MD;  Location: [facility];  Service: Endoscopy;  Laterality: N/AVenia Minks 53   ESOPHAGOGASTRODUODENOSCOPY (EGD) WITH PROPOFOL N/A 10/27/2014   Dr. Gala Romney: Normal EGD. status post maloney dilation follwed by esophageal and duodenal biopsy. benign esophageal and duodenal biopsies   HEMORRHOID BANDING  2016   Dr.Rourk   INTERSTIM IMPLANT PLACEMENT N/A 10/08/2012   Procedure: Barrie Lyme IMPLANT FIRST STAGE;  Surgeon: Reece Packer, MD;  Location: [facility];  Service: Urology;  Laterality: N/A;   INTERSTIM IMPLANT PLACEMENT N/A 10/08/2012   Procedure: Barrie Lyme IMPLANT SECOND STAGE;  Surgeon: Reece Packer, MD;  Location: [facility];  Service: Urology;  Laterality: N/A;   LASIK     bilateral   TONSILLECTOMY  AGE 50'S    OB History     Gravida  3   Para  2   Term  2   Preterm      AB  Living  3      SAB      IAB      Ectopic      Multiple  1   Live Births  3            Home Medications    Prior to Admission medications   Medication Sig Start Date End Date Taking? Authorizing Provider  benzonatate (TESSALON) 100 MG capsule Take 1 capsule (100 mg total) by mouth every 8 (eight) hours as needed for cough. 06/27/21  Yes Ariyonna Twichell, Michele Rockers, FNP  oseltamivir (TAMIFLU) 75 MG capsule Take 1 capsule (75 mg total) by mouth every 12 (twelve) hours. 06/27/21  Yes Koi Zangara, Hildred Alamin E, FNP  acetaminophen (TYLENOL) 500 MG tablet Take 1,000 mg  by mouth every 8 (eight) hours as needed for pain.    [provider]  albuterol (VENTOLIN HFA) 108 (90 Base) MCG/ACT inhaler Inhale 1-2 puffs into the lungs every 6 (six) hours as needed for wheezing or shortness of breath. 07/07/20   Avegno, Darrelyn Hillock, FNP  ALPRAZolam Duanne Moron) 1 MG tablet Take 1 mg by mouth at bedtime.     [provider]  baclofen (LIORESAL) 10 MG tablet Take 10 mg by mouth 2 (two) times daily as needed for muscle spasms.     [provider]  cetirizine (ZYRTEC) 10 MG tablet Take 10 mg by mouth daily.    [provider]  Cholecalciferol (VITAMIN D PO) Take 1 tablet by mouth daily.     [provider]  diclofenac Sodium (VOLTAREN) 1 % GEL Apply 1 application topically 3 (three) times daily as needed (pain).    [provider]  esomeprazole (NEXIUM) 40 MG capsule TAKE 1 CAPSULE DAILY BEFORE BREAKFAST 12/30/19   Annitta Needs, NP  FLUoxetine (PROZAC) 20 MG tablet Take 40 mg by mouth daily.     [provider]  fluticasone (FLONASE) 50 MCG/ACT nasal spray Place 1 spray into both nostrils daily.    [provider]  ibuprofen (ADVIL,MOTRIN) 200 MG tablet Take 800 mg by mouth every 6 (six) hours as needed for pain.     [provider]  Melatonin 3 MG TABS Take 3 mg by mouth at bedtime. 01/21/18   [provider]  Na Sulfate-K Sulfate-Mg Sulf (SUPREP BOWEL PREP KIT) 17.5-3.13-1.6 GM/177ML SOLN Take 1 kit by mouth as directed. 08/27/19   Rourk, Cristopher Estimable, MD  Prucalopride Succinate (MOTEGRITY) 2 MG TABS Take 1 tablet (2 mg total) by mouth daily. Patient not taking: Reported on 10/27/2019 08/27/19   Annitta Needs, NP  Tetrahydrozoline-Zn Sulfate (ALLERGY RELIEF EYE DROPS OP) Place 1 drop into both eyes 2 (two) times daily as needed (allergies).    [provider]  vitamin B-12 (CYANOCOBALAMIN) 1000 MCG tablet Take 1,000 mcg by mouth daily.    [provider]  zonisamide (ZONEGRAN) 100 MG  capsule Take 400 mg by mouth at bedtime.     [provider]    Family History Family History  Problem Relation Age of Onset   Diabetes Mother        Died, 83   Colon polyps Mother        less than age 48   Diabetes Father        Living, 40   Cancer Father        prostate   Hypertension Father    Atrial fibrillation Father    CAD Father    Hypertension Maternal Grandmother  Heart disease Maternal Grandmother    Diabetes Paternal Grandfather    Breast cancer Maternal Aunt    Cancer Cousin        MATERNAL   Stroke Paternal Grandmother    Colon cancer Neg Hx     Social History Social History   Tobacco Use   Smoking status: Never   Smokeless tobacco: Never  Vaping Use   Vaping Use: Never used  Substance Use Topics   Alcohol use: No    Alcohol/week: 0.0 standard drinks   Drug use: No     Allergies   Other, Tioconazole, Monistat [miconazole], and Tape   Review of Systems Review of Systems Per HPI  Physical Exam Triage Vital Signs ED Triage Vitals [06/27/21 1615]  Enc Vitals Group     BP 112/73     Pulse Rate 71     Resp 18     Temp 98.3 F (36.8 C)     Temp Source Oral     SpO2 98 %     Weight      Height      Head Circumference      Peak Flow      Pain Score 0     Pain Loc      Pain Edu?      Excl. in [place]?    No data found.  Updated Vital Signs BP 112/73    Pulse 71    Temp 98.3 F (36.8 C) (Oral)    Resp 18    LMP 04/07/2016    SpO2 98%   Visual Acuity Right Eye Distance:   Left Eye Distance:   Bilateral Distance:    Right Eye Near:   Left Eye Near:    Bilateral Near:     Physical Exam Constitutional:      General: She is not in acute distress.    Appearance: Normal appearance. She is not toxic-appearing or diaphoretic.  HENT:     Head: Normocephalic and atraumatic.     Right Ear: Tympanic membrane and ear canal normal.     Left Ear: Tympanic membrane and ear canal normal.     Nose: Congestion present.      Mouth/Throat:     Mouth: Mucous membranes are moist.     Pharynx: Posterior oropharyngeal erythema present.  Eyes:     Extraocular Movements: Extraocular movements intact.     Conjunctiva/sclera: Conjunctivae normal.     Pupils: Pupils are equal, round, and reactive to light.  Cardiovascular:     Rate and Rhythm: Normal rate and regular rhythm.     Pulses: Normal pulses.     Heart sounds: Normal heart sounds.  Pulmonary:     Effort: Pulmonary effort is normal. No respiratory distress.     Breath sounds: Normal breath sounds. No stridor. No wheezing, rhonchi or rales.  Abdominal:     General: Abdomen is flat. Bowel sounds are normal.     Palpations: Abdomen is soft.  Musculoskeletal:        General: Normal range of motion.     Cervical back: Normal range of motion.  Skin:    General: Skin is warm and dry.  Neurological:     General: No focal deficit present.     Mental Status: She is alert and oriented to person, place, and time. Mental status is at baseline.  Psychiatric:        Mood and Affect: Mood normal.        Behavior: Behavior  normal.     UC Treatments / Results  Labs (all labs ordered are listed, but only abnormal results are displayed) Labs Reviewed  CULTURE, GROUP A STREP ([place])  COVID-19, FLU A+B NAA  POCT RAPID STREP A (OFFICE)    EKG   Radiology No results found.  Procedures Procedures (including critical care time)  Medications Ordered in UC Medications - No data to display  Initial Impression / Assessment and Plan / UC Course  I have reviewed the triage vital signs and the nursing notes.  Pertinent labs & imaging results that were available during my care of the patient were reviewed by me and considered in my medical decision making (see chart for details).     Patient presents with symptoms likely from a viral upper respiratory infection. Differential includes bacterial pneumonia, sinusitis, allergic rhinitis, Covid 19, flu. Do not suspect  underlying cardiopulmonary process. Symptoms seem unlikely related to ACS, CHF or COPD exacerbations, pneumonia, pneumothorax. Patient is nontoxic appearing and not in need of emergent medical intervention.  Rapid strep was negative.  Throat culture, COVID-19, flu test pending.  Recommended symptom control with over the counter medications.  Patient requesting Tamiflu.  Highly suspicious of the flu so will opt to treat with Tamiflu.  Return if symptoms fail to improve in 1-2 weeks or you develop shortness of breath, chest pain, severe headache. Patient states understanding and is agreeable.  Discharged with PCP followup.  Final Clinical Impressions(s) / UC Diagnoses   Final diagnoses:  Viral upper respiratory tract infection with cough  Sore throat     Discharge Instructions      It appears that you have a viral upper respiratory infection.  Your rapid strep was negative.  Throat culture, COVID-19, flu swabs are pending.  You have been prescribed Tamiflu and a cough medication.     ED Prescriptions     Medication Sig Dispense Auth. Provider   oseltamivir (TAMIFLU) 75 MG capsule Take 1 capsule (75 mg total) by mouth every 12 (twelve) hours. 10 capsule [place], [facility], [place]   benzonatate (TESSALON) 100 MG capsule Take 1 capsule (100 mg total) by mouth every 8 (eight) hours as needed for cough. 21 capsule [place], Michele Rockers, [place]      PDMP not reviewed this encounter.   Teodora Medici, [place] 06/27/21 [id number]

## 2021-06-28 LAB — COVID-19, FLU A+B NAA
Influenza A, NAA: NOT DETECTED
Influenza B, NAA: NOT DETECTED
SARS-CoV-2, NAA: NOT DETECTED

## 2021-07-01 LAB — CULTURE, GROUP A STREP (THRC)

## 2021-09-20 ENCOUNTER — Encounter: Payer: Self-pay | Admitting: Internal Medicine

## 2021-09-20 ENCOUNTER — Other Ambulatory Visit: Payer: Self-pay

## 2021-09-20 ENCOUNTER — Ambulatory Visit: Payer: BC Managed Care – PPO | Admitting: Internal Medicine

## 2021-09-20 VITALS — BP 100/60 | HR 89 | Temp 97.5°F | Ht 64.0 in | Wt 128.4 lb

## 2021-09-20 DIAGNOSIS — R194 Change in bowel habit: Secondary | ICD-10-CM | POA: Diagnosis not present

## 2021-09-20 DIAGNOSIS — R1319 Other dysphagia: Secondary | ICD-10-CM | POA: Diagnosis not present

## 2021-09-20 DIAGNOSIS — K219 Gastro-esophageal reflux disease without esophagitis: Secondary | ICD-10-CM | POA: Diagnosis not present

## 2021-09-20 NOTE — Patient Instructions (Signed)
It was good seeing you again today! ? ?As discussed, we will plan for an upper endoscopy with esophageal dilation as feasible/appropriate per plan ? ?In addition, we will perform a diagnostic colonoscopy due to a change in bowel habits in your mother's history of colon polyps (ASA 3) ? ?For now, continue esomeprazole or Nexium 40 mg daily. ? ?As discussed, some of your constipation issues may be related to pelvic floor muscle incoordination. ? ?Further recommendations to be made after upper endoscopy and colonoscopy have been carried out. ?

## 2021-09-20 NOTE — Progress Notes (Signed)
? ? ?Primary Care Physician:  Shawnie DapperMann, Benjamin L, PA-C ?Primary Gastroenterologist:  Dr.  Marland Kitchen? ?Pre-Procedure History & Physical: ?HPI:  Kristina Lewis is a 56 y.o. female here for further evaluation of altered bowel habits.  Patient notes chronic constipation.  May go days without a bowel movement;  has to strain to have a BM.  At other times, has diarrhea and episodes of incontinence.  Has seen scant blood per rectum.  Notes when she has a formed bowel movement these days she has very skinny caliber of stool.  GERD well controlled on Nexium.  However, she notes recurrent esophageal dysphagia.  She notes improvement in swallowing after empiric dilation of a normal-appearing esophagus 2016 until recently when dysphagia has now recurred.  Denies GERD; esomeprazole 40 mg daily. ?Mother with colonic polyps. ?Longstanding Parkinson's now.  She has a history of interstitial cystitis.  She has both a deep brain stimulator as well as a urinary bladder stimulator in place and active. ? ?She reports going through a divorce currently and is under a great deal of stress. ? ?Past Medical History:  ?Diagnosis Date  ? Anxiety   ? Atypical chest pain 03/15/2017  ? Bulging disc   ? CERVICAL  ? Hemorrhoids   ? IC (interstitial cystitis)   ? bladder stimulator 10/2012  ? Left leg pain   ? Palpitations 03/15/2017  ? Parkinson's disease ([facility])   ? PONV (postoperative nausea and vomiting)   ? PVC (premature ventricular contraction) 04/04/2017  ? Seasonal allergies   ? Urge incontinence   ? ? ?Past Surgical History:  ?Procedure Laterality Date  ? BIOPSY N/A 10/27/2014  ? Procedure: BIOPSY;  Surgeon: Corbin Adeobert M Yoniel Arkwright, MD;  Location: [facility];  Service: Endoscopy;  Laterality: N/A;  duodenal, esophageal  ? BREAST ENHANCEMENT SURGERY  AGE 27  ? CESAREAN SECTION  12/2000  &  01-26-2003  ? X2  ? COLONOSCOPY WITH PROPOFOL N/A 10/27/2014  ? Dr. Jena Gaussourk: Minimal internal heorrhoids;otherwise normal rectum, colon and terminal ileum.   ? cranial neurostimulator  implant  04/2018  ? for deep brain simulation. [facility]  ? CYSTO/ URETHRAL DILATION/ HOD  X2  LAST ONE 12-19-2004  ? ESOPHAGEAL DILATION N/A 10/27/2014  ? Procedure: ESOPHAGEAL DILATION;  Surgeon: Corbin Adeobert M Amaris Delafuente, MD;  Location: [facility];  Service: Endoscopy;  Laterality: N/AElease Hashimoto;  [place] 53  ? ESOPHAGOGASTRODUODENOSCOPY (EGD) WITH PROPOFOL N/A 10/27/2014  ? Dr. Jena Gaussourk: Normal EGD. status post maloney dilation follwed by esophageal and duodenal biopsy. benign esophageal and duodenal biopsies  ? HEMORRHOID BANDING  2016  ? Dr.Vika Buske  ? INTERSTIM IMPLANT PLACEMENT N/A 10/08/2012  ? Procedure: INTERSTIM IMPLANT FIRST STAGE;  Surgeon: Martina SinnerScott A MacDiarmid, MD;  [facility];  Service: Urology;  Laterality: N/A;  ? INTERSTIM IMPLANT PLACEMENT N/A 10/08/2012  ? Procedure: INTERSTIM IMPLANT SECOND STAGE;  Surgeon: Martina SinnerScott A MacDiarmid, MD;  [facility];  Service: Urology;  Laterality: N/A;  ? LASIK    ? bilateral  ? TONSILLECTOMY  AGE 27'S  ? ? ?Prior to Admission medications   ?Medication Sig Start Date End Date Taking? Authorizing Provider  ?ALPRAZolam (XANAX) 1 MG tablet Take 1 mg by mouth at bedtime.   Yes [provider]  ?baclofen (LIORESAL) 10 MG tablet Take 10 mg by mouth 2 (two) times daily as needed for muscle spasms. PRN   Yes [provider]  ?benzonatate (TESSALON) 100 MG capsule Take 1 capsule (100 mg total) by mouth every 8 (eight)  hours as needed for cough. 06/27/21  Yes Gustavus Bryant, FNP  ?carbidopa-levodopa (SINEMET IR) 25-100 MG tablet Take 0.5-1 tablets by mouth 3 (three) times daily. 05/05/21  Yes [provider]  ?cetirizine (ZYRTEC) 10 MG tablet Take 10 mg by mouth daily.   Yes [provider]  ?Cholecalciferol (VITAMIN D PO) Take 1 tablet by mouth daily.    Yes [provider]  ?citalopram (CELEXA) 20 MG tablet Take 20 mg by mouth daily. 04/04/21  Yes [provider]  ?diclofenac Sodium (VOLTAREN) 1 % GEL Apply 1  application topically 3 (three) times daily as needed (pain).   Yes [provider]  ?esomeprazole (NEXIUM) 40 MG capsule TAKE 1 CAPSULE DAILY BEFORE BREAKFAST 12/30/19  Yes Gelene Mink, NP  ?fluticasone (FLONASE) 50 MCG/ACT nasal spray Place 1 spray into both nostrils daily.   Yes [provider]  ?ibuprofen (ADVIL,MOTRIN) 200 MG tablet Take 800 mg by mouth every 6 (six) hours as needed for pain.    Yes [provider]  ?Tetrahydrozoline-Zn Sulfate (ALLERGY RELIEF EYE DROPS OP) Place 1 drop into both eyes 2 (two) times daily as needed (allergies).   Yes [provider]  ?vitamin B-12 (CYANOCOBALAMIN) 1000 MCG tablet Take 1,000 mcg by mouth daily.   Yes [provider]  ?zonisamide (ZONEGRAN) 100 MG capsule Take 400 mg by mouth at bedtime.    Yes [provider]  ?Melatonin 3 MG TABS Take 3 mg by mouth at bedtime. ?Patient not taking: Reported on 09/20/2021 01/21/18   [provider]  ? ? ?Allergies as of 09/20/2021 - Review Complete 09/20/2021  ?Allergen Reaction Noted  ? Other Diarrhea and Swelling 05/18/2014  ? Tioconazole Itching and Swelling 12/16/2014  ? Monistat [miconazole] Swelling 11/28/2011  ? Tape Itching 10/04/2012  ? ? ?Family History  ?Problem Relation Age of Onset  ? Diabetes Mother   ?     Died, 16  ? Colon polyps Mother   ?     less than age 74  ? Diabetes Father   ?     Living, 81  ? Cancer Father   ?     prostate  ? Hypertension Father   ? Atrial fibrillation Father   ? CAD Father   ? Hypertension Maternal Grandmother   ? Heart disease Maternal Grandmother   ? Diabetes Paternal Grandfather   ? Breast cancer Maternal Aunt   ? Cancer Cousin   ?     MATERNAL  ? Stroke Paternal Grandmother   ? Colon cancer Neg Hx   ? ? ?Social History  ? ?Socioeconomic History  ? Marital status: Married  ?  Spouse name: Not on file  ? Number of children: 3  ? Years of education: Not on file  ? Highest education level: Not on file  ?Occupational History  ?  Not on file  ?Tobacco Use  ? Smoking status: Never  ? Smokeless tobacco: Never  ?Vaping Use  ? Vaping Use: Never used  ?Substance and Sexual Activity  ? Alcohol use: No  ?  Alcohol/week: 0.0 standard drinks  ? Drug use: No  ? Sexual activity: Not Currently  ?  Birth control/protection: Pill  ?Other Topics Concern  ? Not on file  ?Social History Narrative  ? 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  She had three healthy children at home.  Husband is Programmer, multimedia.    ? ?Social Determinants of Health  ? ?Financial Resource Strain: Not on file  ?Food Insecurity: Not on file  ?  Transportation Needs: Not on file  ?Physical Activity: Not on file  ?Stress: Not on file  ?Social Connections: Not on file  ?Intimate Partner Violence: Not on file  ? ? ?Review of Systems: ?See HPI, otherwise negative ROS ? ?Physical Exam: ?BP 100/60 (BP Location: Right Arm, Patient Position: Sitting, Cuff Size: Normal)   Pulse 89   Temp (!) 97.5 ?F (36.4 ?C) (Temporal)   Ht 5\' 4"  (1.626 m)   Wt 128 lb 6.4 oz (58.2 kg)   LMP 04/07/2016   SpO2 97%   BMI 22.04 kg/m?  ?General:   Alert,  Well-developed, well-nourished, pleasant and cooperative in NAD ?Neck:  Supple; no masses or thyromegaly. No significant cervical adenopathy. ?Lungs:  Clear throughout to auscultation.   No wheezes, crackles, or rhonchi. No acute distress. ?Heart:  Regular rate and rhythm; no murmurs, clicks, rubs,  or gallops. ?Abdomen: Non-distended, normal bowel sounds.  Palpable deep brain stimulator unit under skin left lower quadrant.  No obvious organomegaly.  Nontender.  Soft and nontender without appreciable mass or hepatosplenomegaly.  ?Pulses:  Normal pulses noted. ?Extremities:  Without clubbing or edema. ?Rectal: No external lesions.  Good sphincter tone.  Rectal vault is empty.  No stool to Hemoccult. ? ?Impression/Plan: 56 year old lady with longstanding altered bowel function, predominantly constipation, punctuated by episodes of diarrhea and occasional fecal incontinence.  Perceived  difficulty with completing a BM; incomplete evacuation.  Diarrhea and incontinence at times.  Paper hematochezia.  Positive family history of colon polyps in first-degree relative. ?May well have pelvic floor dysfunction as a

## 2021-09-29 ENCOUNTER — Other Ambulatory Visit: Payer: Self-pay | Admitting: Family Medicine

## 2021-09-29 DIAGNOSIS — N6310 Unspecified lump in the right breast, unspecified quadrant: Secondary | ICD-10-CM

## 2021-09-29 DIAGNOSIS — Z9882 Breast implant status: Secondary | ICD-10-CM

## 2021-10-05 ENCOUNTER — Telehealth: Payer: Self-pay | Admitting: *Deleted

## 2021-10-05 ENCOUNTER — Encounter: Payer: Self-pay | Admitting: *Deleted

## 2021-10-05 MED ORDER — PEG 3350-KCL-NA BICARB-NACL 420 G PO SOLR: ORAL | 0 refills | Status: DC

## 2021-10-05 NOTE — Telephone Encounter (Signed)
Called pt. She has been scheduled for TCS/EGD/DIL ASA 3, Dr. Jena Gauss on 5/25 at 7:30am. Aware will mail prep instructions/pre-op appt. Will send rx for prep to pharmacy. ?

## 2021-10-18 ENCOUNTER — Telehealth: Payer: Self-pay | Admitting: Internal Medicine

## 2021-10-18 NOTE — Telephone Encounter (Signed)
Patient needs to reschedule procedure  

## 2021-10-18 NOTE — Telephone Encounter (Signed)
Called pt. Wanted to see if her procedure can be moved to 6/8. Dr. Jena Gauss on vacation that day. Patient will stay where she is for now. ?

## 2021-10-26 ENCOUNTER — Ambulatory Visit
Admission: RE | Admit: 2021-10-26 | Discharge: 2021-10-26 | Disposition: A | Discharge status: Home / Self Care | Payer: BC Managed Care – PPO | Source: Ambulatory Visit | Attending: Family Medicine | Admitting: Family Medicine

## 2021-10-26 DIAGNOSIS — N6310 Unspecified lump in the right breast, unspecified quadrant: Secondary | ICD-10-CM

## 2021-10-26 DIAGNOSIS — Z9882 Breast implant status: Secondary | ICD-10-CM

## 2021-11-17 ENCOUNTER — Telehealth: Payer: Self-pay

## 2021-11-17 NOTE — Telephone Encounter (Signed)
Pt called office requesting to cancel TCS/EGD scheduled for 11/24/21 because she is unable to take off work. She wants to wait until December to do procedure. Advised her to call office to schedule OV couple months before she wants to have procedure.  Endo scheduler informed to cancel procedure.

## 2021-11-21 ENCOUNTER — Other Ambulatory Visit (HOSPITAL_COMMUNITY): Payer: BC Managed Care – PPO

## 2021-11-22 ENCOUNTER — Encounter (HOSPITAL_COMMUNITY): Payer: BC Managed Care – PPO

## 2021-11-24 ENCOUNTER — Ambulatory Visit (HOSPITAL_COMMUNITY)
Admission: RE | Admit: 2021-11-24 | Payer: BC Managed Care – PPO | Source: Home / Self Care | Admitting: Internal Medicine

## 2021-11-24 ENCOUNTER — Encounter (HOSPITAL_COMMUNITY): Admission: RE | Payer: Self-pay | Source: Home / Self Care

## 2021-11-24 SURGERY — COLONOSCOPY WITH PROPOFOL
Anesthesia: Monitor Anesthesia Care

## 2021-12-14 ENCOUNTER — Ambulatory Visit (HOSPITAL_COMMUNITY)
Admission: RE | Admit: 2021-12-14 | Discharge: 2021-12-14 | Disposition: A | Discharge status: Home / Self Care | Payer: BC Managed Care – PPO | Source: Ambulatory Visit | Attending: Internal Medicine | Admitting: Internal Medicine

## 2021-12-14 ENCOUNTER — Other Ambulatory Visit (HOSPITAL_COMMUNITY): Payer: Self-pay | Admitting: Internal Medicine

## 2021-12-14 ENCOUNTER — Other Ambulatory Visit: Payer: Self-pay | Admitting: Internal Medicine

## 2021-12-14 DIAGNOSIS — S0990XA Unspecified injury of head, initial encounter: Secondary | ICD-10-CM | POA: Diagnosis present

## 2021-12-14 DIAGNOSIS — S060XAA Concussion with loss of consciousness status unknown, initial encounter: Secondary | ICD-10-CM | POA: Insufficient documentation

## 2021-12-14 DIAGNOSIS — G4489 Other headache syndrome: Secondary | ICD-10-CM | POA: Insufficient documentation

## 2021-12-14 DIAGNOSIS — H539 Unspecified visual disturbance: Secondary | ICD-10-CM | POA: Insufficient documentation

## 2022-03-16 ENCOUNTER — Other Ambulatory Visit (HOSPITAL_COMMUNITY): Payer: Self-pay | Admitting: Family Medicine

## 2022-03-16 ENCOUNTER — Ambulatory Visit (HOSPITAL_COMMUNITY)
Admission: RE | Admit: 2022-03-16 | Discharge: 2022-03-16 | Disposition: A | Discharge status: Home / Self Care | Payer: BC Managed Care – PPO | Source: Ambulatory Visit | Attending: Family Medicine | Admitting: Family Medicine

## 2022-03-16 DIAGNOSIS — J209 Acute bronchitis, unspecified: Secondary | ICD-10-CM | POA: Insufficient documentation

## 2022-08-09 ENCOUNTER — Telehealth (INDEPENDENT_AMBULATORY_CARE_PROVIDER_SITE_OTHER): Payer: Self-pay | Admitting: *Deleted

## 2022-08-09 ENCOUNTER — Encounter: Payer: Self-pay | Admitting: Gastroenterology

## 2022-08-09 ENCOUNTER — Ambulatory Visit: Payer: BC Managed Care – PPO | Admitting: Gastroenterology

## 2022-08-09 VITALS — BP 100/66 | HR 73 | Temp 97.6°F | Ht 63.0 in | Wt 125.2 lb

## 2022-08-09 DIAGNOSIS — K219 Gastro-esophageal reflux disease without esophagitis: Secondary | ICD-10-CM

## 2022-08-09 DIAGNOSIS — Z83719 Family history of colon polyps, unspecified: Secondary | ICD-10-CM | POA: Diagnosis not present

## 2022-08-09 DIAGNOSIS — R1319 Other dysphagia: Secondary | ICD-10-CM

## 2022-08-09 DIAGNOSIS — A09 Infectious gastroenteritis and colitis, unspecified: Secondary | ICD-10-CM | POA: Diagnosis not present

## 2022-08-09 DIAGNOSIS — R197 Diarrhea, unspecified: Secondary | ICD-10-CM | POA: Insufficient documentation

## 2022-08-09 NOTE — Patient Instructions (Addendum)
Please complete labs and stool test as soon as possible. Once we rule out infection, we can consider trial of medication to slow down stools.  Upper endoscopy and colonoscopy to be scheduled.   It was a pleasure to see you today. I want to create trusting relationships with patients and provide genuine, compassionate, and quality care. I truly value your feedback, so please be on the lookout for a survey regarding your visit with me today. I appreciate your time in completing this!

## 2022-08-09 NOTE — Progress Notes (Signed)
GI Office Note    Referring Provider: Ginger Organ Primary Care Physician:  Ginger Organ  Primary Gastroenterologist: Garfield Cornea, MD   Chief Complaint   Chief Complaint  Patient presents with   Diarrhea    History of Present Illness   Kristina Lewis is a 57 y.o. female presenting today for follow up.  Patient last seen in March 2023.  She has a history of altered bowel habits, scant bright bright red blood per rectum.  History of GERD well-controlled on Nexium, recurrent esophageal dysphagia, previously with improvement with empiric dilation of the normal-appearing esophagus in 2016.  Mother with history of colon polyps.  She has a history of longstanding Parkinson's.  She has history of interstitial cystitis.  She has a deep brain stimulator as well as a urinary bladder stimulator.  At last OV, she was having predominance of constipation. For three weeks she is having diarrhea every day. Typically at least five stools per day. Stools are loose. Dark at times. No fresh blood per rectum lately. Last antibiotic use about two months ago for URI. She has lower abdominal pain that radiates into the epigastric region. No vomiting. Continues to have solid food dysphagia. Heartburn if she forgets her Nexium. She has been consuming mostly gluten free because she is sensitive but she does cheat. Eating BRAT diet due to diarrhea. She has diarrhea no matter what she eats.    She wants to schedule her EGD/ED/colonoscopy, she had to cancel last year. She notes that she cannot tolerate large volume prep.    Medications   Current Outpatient Medications  Medication Sig Dispense Refill   ALPRAZolam (XANAX) 1 MG tablet Take 1.5 mg by mouth at bedtime.     carbidopa-levodopa (SINEMET IR) 25-100 MG tablet Take 0.5-1 tablets by mouth 3 (three) times daily.     cetirizine (ZYRTEC) 10 MG tablet Take 10 mg by mouth daily.     citalopram (CELEXA) 20 MG tablet Take 20 mg by mouth  daily.     diclofenac Sodium (VOLTAREN) 1 % GEL Apply 1 application topically 3 (three) times daily as needed (pain).     esomeprazole (NEXIUM) 40 MG capsule TAKE 1 CAPSULE DAILY BEFORE BREAKFAST 90 capsule 3   fluticasone (FLONASE) 50 MCG/ACT nasal spray Place 1 spray into both nostrils daily.     ibuprofen (ADVIL,MOTRIN) 200 MG tablet Take 800 mg by mouth every 6 (six) hours as needed for pain.      Tetrahydrozoline-Zn Sulfate (ALLERGY RELIEF EYE DROPS OP) Place 1 drop into both eyes 2 (two) times daily as needed (allergies).     zonisamide (ZONEGRAN) 100 MG capsule Take 400 mg by mouth at bedtime.      No current facility-administered medications for this visit.    Allergies   Allergies as of 08/09/2022 - Review Complete 08/09/2022  Allergen Reaction Noted   Other Diarrhea and Swelling 05/18/2014   Tioconazole Itching and Swelling 12/16/2014   Monistat [miconazole] Swelling 11/28/2011   Tape Itching 16/04/9603   Silicone Itching and Rash 07/20/2022     Past Medical History   Past Medical History:  Diagnosis Date   Anxiety    Atypical chest pain 03/15/2017   Bulging disc    CERVICAL   Hemorrhoids    IC (interstitial cystitis)    bladder stimulator 10/2012   Left leg pain    Palpitations 03/15/2017   Parkinson's disease    deep brain stimulator placed  PONV (postoperative nausea and vomiting)    PVC (premature ventricular contraction) 04/04/2017   Seasonal allergies    Urge incontinence     Past Surgical History   Past Surgical History:  Procedure Laterality Date   BIOPSY N/A 10/27/2014   Procedure: BIOPSY;  Surgeon: Daneil Dolin, MD;  Location: [facility];  Service: Endoscopy;  Laterality: N/A;  duodenal, esophageal   BREAST ENHANCEMENT SURGERY  AGE 31   CESAREAN SECTION  12/2000  &  01-26-2003   X2   COLONOSCOPY WITH PROPOFOL N/A 10/27/2014   Dr. Gala Romney: Minimal internal heorrhoids;otherwise normal rectum, colon and terminal ileum.    cranial neurostimulator implant   04/2018   for deep brain simulation. [facility]   CYSTO/ URETHRAL DILATION/ HOD  X2  LAST ONE 12-19-2004   ESOPHAGEAL DILATION N/A 10/27/2014   Procedure: ESOPHAGEAL DILATION;  Surgeon: Daneil Dolin, MD;  Location: [facility];  Service: Endoscopy;  Laterality: N/AVenia Minks 53   ESOPHAGOGASTRODUODENOSCOPY (EGD) WITH PROPOFOL N/A 10/27/2014   Dr. Gala Romney: Normal EGD. status post maloney dilation follwed by esophageal and duodenal biopsy. benign esophageal and duodenal biopsies   HEMORRHOID BANDING  2016   Dr.Rourk   INTERSTIM IMPLANT PLACEMENT N/A 10/08/2012   Procedure: Barrie Lyme IMPLANT FIRST STAGE;  Surgeon: Reece Packer, MD;  Location: [facility];  Service: Urology;  Laterality: N/A;   INTERSTIM IMPLANT PLACEMENT N/A 10/08/2012   Procedure: Barrie Lyme IMPLANT SECOND STAGE;  Surgeon: Reece Packer, MD;  Location: [facility];  Service: Urology;  Laterality: N/A;   LASIK     bilateral   TONSILLECTOMY  AGE 50'S    Past Family History   Family History  Problem Relation Age of Onset   Diabetes Mother        Died, 23   Colon polyps Mother        less than age 55   Diabetes Father        Living, 93   Cancer Father        prostate   Hypertension Father    Atrial fibrillation Father    CAD Father    Hypertension Maternal Grandmother    Heart disease Maternal Grandmother    Diabetes Paternal Grandfather    Breast cancer Maternal Aunt    Cancer Cousin        MATERNAL   Stroke Paternal Grandmother    Colon cancer Neg Hx     Past Social History   Social History   Socioeconomic History   Marital status: Married    Spouse name: Not on file   Number of children: 3   Years of education: Not on file   Highest education level: Not on file  Occupational History   Not on file  Tobacco Use   Smoking status: Never   Smokeless tobacco: Never  Vaping Use   Vaping Use: Never used  Substance and Sexual Activity   Alcohol use: No    Alcohol/week: 0.0  standard drinks of alcohol   Drug use: No   Sexual activity: Not Currently    Birth control/protection: Pill  Other Topics Concern   Not on file  Social History Narrative   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  She had three healthy children at home.  Husband is Environmental education officer.     Social Determinants of Health   Financial Resource Strain: Not on file  Food Insecurity: Not on file  Transportation Needs: Not on file  Physical Activity: Not on file  Stress: Not on  file  Social Connections: Not on file  Intimate Partner Violence: Not on file    Review of Systems   General: Negative for anorexia, weight loss, fever, chills, fatigue, weakness. ENT: Negative for hoarseness, difficulty swallowing , nasal congestion. CV: Negative for chest pain, angina, palpitations, dyspnea on exertion, peripheral edema.  Respiratory: Negative for dyspnea at rest, dyspnea on exertion, cough, sputum, wheezing.  GI: See history of present illness. GU:  Negative for dysuria, hematuria, urinary incontinence, urinary frequency, nocturnal urination.  Endo: Negative for unusual weight change.     Physical Exam   BP 100/66 (BP Location: Right Arm, Patient Position: Sitting, Cuff Size: Normal)   Pulse 73   Temp 97.6 F (36.4 C) (Oral)   Ht 5\' 3"  (1.6 m)   Wt 125 lb 3.2 oz (56.8 kg)   LMP 04/07/2016   SpO2 98%   BMI 22.18 kg/m    General: Well-nourished, well-developed in no acute distress.  Eyes: No icterus. Mouth: Oropharyngeal mucosa moist and pink   Lungs: Clear to auscultation bilaterally.  Heart: Regular rate and rhythm, no murmurs rubs or gallops.  Abdomen: Bowel sounds are normal, nontender, nondistended, no hepatosplenomegaly or masses,  no abdominal bruits or hernia , no rebound or guarding.  Rectal:not performed Extremities: No lower extremity edema. No clubbing or deformities. Neuro: Alert and oriented x 4   Skin: Warm and dry, no jaundice.   Psych: Alert and cooperative, normal mood and affect.  Labs   No  recent labs  Imaging Studies   No results found.  Assessment   Diarrhea/FH colon polyps: persistent for three weeks. Previously with altered bowel habits ranging from constipation to diarrhea. In the past has had scant brbpr, none recently. She had to cancel her colonoscopy last year and is interested in rescheduling now. She has FH of colon polyps, mother, two maternal cousins. We need to rule out infectious etiology. She has had duodenal biopsies in 2016 negative for celiac. Never had celiac serologies. Notes gluten sensitivity. She does eat some gluten but tries to limit.   GERD/Dysphagia: typical heartburn controlled on PPI. Ongoing solid food dysphagia. Previously responded to empiric dilation of esophagus in 2016.  She has occasional "choking" on liquids.    PLAN   Colonoscopy/EGD+/-ED with Dr. Gala Romney. ASA 3.  I have discussed the risks, alternatives, benefits with regards to but not limited to the risk of reaction to medication, bleeding, infection, perforation and the patient is agreeable to proceed. Written consent to be obtained. SHE HAS BOTH DEEP BRAIN STIMULATOR AND URINARY BLADDER STIMULATOR (INTERSTIM).  Sample of Clenpiq provided.  Check stools for infection. Celiac serologies. If no infection, can try antidiarrheal medication pending procedures.   Laureen Ochs. Bobby Rumpf, [place], [facility]

## 2022-08-09 NOTE — Telephone Encounter (Signed)
Called pt. Scheduled for TCS/EGD/ED with Dr. Gala Romney, asa 3 on 2/14 at 1:45pm. Aware will send instructions via mychart. She already has clenpiq.

## 2022-08-10 ENCOUNTER — Encounter: Payer: Self-pay | Admitting: *Deleted

## 2022-08-10 ENCOUNTER — Telehealth: Payer: Self-pay | Admitting: *Deleted

## 2022-08-10 LAB — CBC WITH DIFFERENTIAL/PLATELET
Basophils Absolute: 0 10*3/uL (ref 0.0–0.2)
Basos: 0 %
EOS (ABSOLUTE): 0 10*3/uL (ref 0.0–0.4)
Eos: 0 %
Hematocrit: 44.6 % (ref 34.0–46.6)
Hemoglobin: 14.8 g/dL (ref 11.1–15.9)
Immature Grans (Abs): 0 10*3/uL (ref 0.0–0.1)
Immature Granulocytes: 0 %
Lymphocytes Absolute: 1.2 10*3/uL (ref 0.7–3.1)
Lymphs: 25 %
MCH: 31.6 pg (ref 26.6–33.0)
MCHC: 33.2 g/dL (ref 31.5–35.7)
MCV: 95 fL (ref 79–97)
Monocytes Absolute: 0.4 10*3/uL (ref 0.1–0.9)
Monocytes: 9 %
Neutrophils Absolute: 3 10*3/uL (ref 1.4–7.0)
Neutrophils: 66 %
Platelets: 256 10*3/uL (ref 150–450)
RBC: 4.68 x10E6/uL (ref 3.77–5.28)
RDW: 12.3 % (ref 11.7–15.4)
WBC: 4.6 10*3/uL (ref 3.4–10.8)

## 2022-08-10 LAB — TISSUE TRANSGLUTAMINASE, IGA: Transglutaminase IgA: <2 U/mL (ref 0–3)

## 2022-08-10 LAB — IGA: IgA/Immunoglobulin A, Serum: 88 mg/dL (ref 87–352)

## 2022-08-10 NOTE — Telephone Encounter (Signed)
Pt called in. She is scheduled for her procedure 2/14 and stated she is trying to work the day prior and she would need to start drinking her 1st bottle of prep at 4pm. She stated she is trying to work until 6pm that day. Wanted to know if she can start her prep later that evening instead?

## 2022-08-10 NOTE — Telephone Encounter (Signed)
She can shift the instructions for the prep by 2 1/2 hours. Start first bottle at 6:30pm. Start second bottle at 12:30am. Is is very important that she being drinking her fluids all day.

## 2022-08-10 NOTE — Telephone Encounter (Signed)
Called pt and made aware of recs. She voiced understanding.

## 2022-08-12 LAB — GI PROFILE, STOOL, PCR

## 2022-08-13 LAB — C DIFFICILE, CYTOTOXIN B

## 2022-08-13 LAB — C DIFFICILE TOXINS A+B W/RFLX: C difficile Toxins A+B, EIA: NEGATIVE

## 2022-08-14 ENCOUNTER — Encounter (HOSPITAL_COMMUNITY)
Admission: RE | Admit: 2022-08-14 | Discharge: 2022-08-14 | Disposition: A | Discharge status: Home / Self Care | Payer: BC Managed Care – PPO | Source: Ambulatory Visit | Attending: Internal Medicine | Admitting: Internal Medicine

## 2022-08-14 ENCOUNTER — Telehealth: Payer: Self-pay | Admitting: *Deleted

## 2022-08-14 ENCOUNTER — Encounter (HOSPITAL_COMMUNITY): Payer: Self-pay

## 2022-08-14 HISTORY — DX: Interstitial cystitis (chronic) without hematuria: N30.10

## 2022-08-14 HISTORY — DX: Migraine, unspecified, not intractable, without status migrainosus: G43.909

## 2022-08-14 HISTORY — DX: Gastro-esophageal reflux disease without esophagitis: K21.9

## 2022-08-14 NOTE — Telephone Encounter (Signed)
Pt called wanting the codes for her upcoming procedures. Codes given to pt and she wanted to know about a work note. I told her to ask the nurses the day of procedure for the note. Pt verbalized understanding.

## 2022-08-14 NOTE — Patient Instructions (Signed)
Kristina Lewis  08/14/2022     @PREFPERIOPPHARMACY$ @   Your procedure is scheduled on 08/16/2022.  Report to [facility] at 11:45 A.M.  Call this number if you have problems the morning of surgery:  [phone]  If you experience any cold or flu symptoms such as cough, fever, chills, shortness of breath, etc. between now and your scheduled surgery, please notify us at the above number.   Remember:    Please follow the diet and prep instructions given to you by Dr Coralee North office.    Take these medicines the morning of surgery with A SIP OF WATER : Sinemet celexa nexium     Do not wear jewelry, make-up or nail polish.  Do not wear lotions, powders, or perfumes, or deodorant.  Do not shave 48 hours prior to surgery.  Men may shave face and neck.  Do not bring valuables to the hospital.  [facility] is not responsible for any belongings or valuables.  Contacts, dentures or bridgework may not be worn into surgery.  Leave your suitcase in the car.  After surgery it may be brought to your room.  For patients admitted to the hospital, discharge time will be determined by your treatment team.  Patients discharged the day of surgery will not be allowed to drive home.   Name and phone number of your driver:   family Special instructions:  N/A  Please read over the following fact sheets that you were given. Care and Recovery After Surgery  Upper Endoscopy, Adult Upper endoscopy is a procedure to look inside the upper GI (gastrointestinal) tract. The upper GI tract is made up of: The esophagus. This is the part of the body that moves food from your mouth to your stomach. The stomach. The duodenum. This is the first part of your small intestine. This procedure is also called esophagogastroduodenoscopy (EGD) or gastroscopy. In this procedure, your health care provider passes a thin, flexible tube (endoscope) through your mouth and down your esophagus into your stomach and into your  duodenum. A small camera is attached to the end of the tube. Images from the camera appear on a monitor in the exam room. During this procedure, your health care provider may also remove a small piece of tissue to be sent to a lab and examined under a microscope (biopsy). Your health care provider may do an upper endoscopy to diagnose cancers of the upper GI tract. You may also have this procedure to find the cause of other conditions, such as: Stomach pain. Heartburn. Pain or problems when swallowing. Nausea and vomiting. Stomach bleeding. Stomach ulcers. Tell a health care provider about: Any allergies you have. All medicines you are taking, including vitamins, herbs, eye drops, creams, and over-the-counter medicines. Any problems you or family members have had with anesthetic medicines. Any bleeding problems you have. Any surgeries you have had. Any medical conditions you have. Whether you are pregnant or may be pregnant. What are the risks? Your healthcare provider will talk with you about risks. These may include: Infection. Bleeding. Allergic reactions to medicines. A tear or hole (perforation) in the esophagus, stomach, or duodenum. What happens before the procedure? When to stop eating and drinking Follow instructions from your health care provider about what you may eat and drink. These may include: 8 hours before your procedure Stop eating most foods. Do not eat meat, fried foods, or fatty foods. Eat only light foods, such as toast or crackers. All liquids are okay  except energy drinks and alcohol. 6 hours before your procedure Stop eating. Drink only clear liquids, such as water, clear fruit juice, black coffee, plain tea, and sports drinks. Do not drink energy drinks or alcohol. 2 hours before your procedure Stop drinking all liquids. You may be allowed to take medicines with small sips of water. If you do not follow your health care provider's instructions, your  procedure may be delayed or canceled. Medicines Ask your health care provider about: Changing or stopping your regular medicines. This is especially important if you are taking diabetes medicines or blood thinners. Taking medicines such as aspirin and ibuprofen. These medicines can thin your blood. Do not take these medicines unless your health care provider tells you to take them. Taking over-the-counter medicines, vitamins, herbs, and supplements. General instructions If you will be going home right after the procedure, plan to have a responsible adult: Take you home from the hospital or clinic. You will not be allowed to drive. Care for you for the time you are told. What happens during the procedure?  An IV will be inserted into one of your veins. You may be given one or more of the following: A medicine to help you relax (sedative). A medicine to numb the throat (local anesthetic). You will lie on your left side on an exam table. Your health care provider will pass the endoscope through your mouth and down your esophagus. Your health care provider will use the scope to check the inside of your esophagus, stomach, and duodenum. Biopsies may be taken. The endoscope will be removed. The procedure may vary among health care providers and hospitals. What happens after the procedure? Your blood pressure, heart rate, breathing rate, and blood oxygen level will be monitored until you leave the hospital or clinic. When your throat is no longer numb, you may be given some fluids to drink. If you were given a sedative during the procedure, it can affect you for several hours. Do not drive or operate machinery until your health care provider says that it is safe. It is up to you to get the results of your procedure. Ask your health care provider, or the department that is doing the procedure, when your results will be ready. Contact a health care provider if you: Have a sore throat that lasts  longer than 1 day. Have a fever. Get help right away if you: Vomit blood or your vomit looks like coffee grounds. Have bloody, black, or tarry stools. Have a very bad sore throat or you cannot swallow. Have difficulty breathing or very bad pain in your chest or abdomen. These symptoms may be an emergency. Get help right away. Call 911. Do not wait to see if the symptoms will go away. Do not drive yourself to the hospital. Summary Upper endoscopy is a procedure to look inside the upper GI tract. During the procedure, an IV will be inserted into one of your veins. You may be given a medicine to help you relax. The endoscope will be passed through your mouth and down your esophagus. Follow instructions from your health care provider about what you can eat and drink. This information is not intended to replace advice given to you by your health care provider. Make sure you discuss any questions you have with your health care provider. Document Revised: 09/28/2021 Document Reviewed: 09/28/2021 Elsevier Patient Education  [place]. Esophageal Dilatation Esophageal dilatation, also called esophageal dilation, is a procedure to widen or open a  blocked or narrowed part of the esophagus. The esophagus is the part of the body that moves food and liquid from the mouth to the stomach. You may need this procedure if: You have a buildup of scar tissue in your esophagus that makes it difficult, painful, or impossible to swallow. This can be caused by gastroesophageal reflux disease (GERD). You have cancer of the esophagus. There is a problem with how food moves through your esophagus. In some cases, you may need this procedure repeated at a later time to dilate the esophagus gradually. Tell a health care provider about: Any allergies you have. All medicines you are taking, including vitamins, herbs, eye drops, creams, and over-the-counter medicines. Any problems you or family members have had  with anesthetic medicines. Any blood disorders you have. Any surgeries you have had. Any medical conditions you have. Any antibiotic medicines you are required to take before dental procedures. Whether you are pregnant or may be pregnant. What are the risks? Generally, this is a safe procedure. However, problems may occur, including: Bleeding due to a tear in the lining of the esophagus. A hole, or perforation, in the esophagus. What happens before the procedure? Ask your health care provider about: Changing or stopping your regular medicines. This is especially important if you are taking diabetes medicines or blood thinners. Taking medicines such as aspirin and ibuprofen. These medicines can thin your blood. Do not take these medicines unless your health care provider tells you to take them. Taking over-the-counter medicines, vitamins, herbs, and supplements. Follow instructions from your health care provider about eating or drinking restrictions. Plan to have a responsible adult take you home from the hospital or clinic. Plan to have a responsible adult care for you for the time you are told after you leave the hospital or clinic. This is important. What happens during the procedure? You may be given a medicine to help you relax (sedative). A numbing medicine may be sprayed into the back of your throat, or you may gargle the medicine. Your health care provider may perform the dilatation using various surgical instruments, such as: Simple dilators. This instrument is carefully placed in the esophagus to stretch it. Guided wire bougies. This involves using an endoscope to insert a wire into the esophagus. A dilator is passed over this wire to enlarge the esophagus. Then the wire is removed. Balloon dilators. An endoscope with a small balloon is inserted into the esophagus. The balloon is inflated to stretch the esophagus and open it up. The procedure may vary among health care providers and  hospitals. What can I expect after the procedure? Your blood pressure, heart rate, breathing rate, and blood oxygen level will be monitored until you leave the hospital or clinic. Your throat may feel slightly sore and numb. This will get better over time. You will not be allowed to eat or drink until your throat is no longer numb. When you are able to drink, urinate, and sit on the edge of the bed without nausea or dizziness, you may be able to return home. Follow these instructions at home: Take over-the-counter and prescription medicines only as told by your health care provider. If you were given a sedative during the procedure, it can affect you for several hours. Do not drive or operate machinery until your health care provider says that it is safe. Plan to have a responsible adult care for you for the time you are told. This is important. Follow instructions from your health care  provider about any eating or drinking restrictions. Do not use any products that contain nicotine or tobacco, such as cigarettes, e-cigarettes, and chewing tobacco. If you need help quitting, ask your health care provider. Keep all follow-up visits. This is important. Contact a health care provider if: You have a fever. You have pain that is not relieved by medicine. Get help right away if: You have chest pain. You have trouble breathing. You have trouble swallowing. You vomit blood. You have black, tarry, or bloody stools. These symptoms may represent a serious problem that is an emergency. Do not wait to see if the symptoms will go away. Get medical help right away. Call your local emergency services (911 in the [place] Do not drive yourself to the hospital. Summary Esophageal dilatation, also called esophageal dilation, is a procedure to widen or open a blocked or narrowed part of the esophagus. Plan to have a responsible adult take you home from the hospital or clinic. For this procedure, a numbing  medicine may be sprayed into the back of your throat, or you may gargle the medicine. Do not drive or operate machinery until your health care provider says that it is safe. This information is not intended to replace advice given to you by your health care provider. Make sure you discuss any questions you have with your health care provider. Document Revised: 11/05/2019 Document Reviewed: 11/05/2019 Elsevier Patient Education  [place].  Colonoscopy, Adult A colonoscopy is a procedure to look at the entire large intestine. This procedure is done using a long, thin, flexible tube that has a camera on the end. You may have a colonoscopy: As a part of normal colorectal screening. If you have certain symptoms, such as: A low number of red blood cells in your blood (anemia). Diarrhea that does not go away. Pain in your abdomen. Blood in your stool. A colonoscopy can help screen for and diagnose medical problems, including: An abnormal growth of cells or tissue (tumor). Abnormal growths within the lining of your intestine (polyps). Inflammation. Areas of bleeding. Tell your health care provider about: Any allergies you have. All medicines you are taking, including vitamins, herbs, eye drops, creams, and over-the-counter medicines. Any problems you or family members have had with anesthetic medicines. Any bleeding problems you have. Any surgeries you have had. Any medical conditions you have. Any problems you have had with having bowel movements. Whether you are pregnant or may be pregnant. What are the risks? Generally, this is a safe procedure. However, problems may occur, including: Bleeding. Damage to your intestine. Allergic reactions to medicines given during the procedure. Infection. This is rare. What happens before the procedure? Eating and drinking restrictions Follow instructions from your health care provider about eating or drinking restrictions, which may  include: A few days before the procedure: Follow a low-fiber diet. Avoid nuts, seeds, dried fruit, raw fruits, and vegetables. 1-3 days before the procedure: Eat only gelatin dessert or ice pops. Drink only clear liquids, such as water, clear juice, clear broth or bouillon, black coffee or tea, or clear soft drinks or sports drinks. Avoid liquids that contain red or purple dye. The day of the procedure: Do not eat solid foods. You may continue to drink clear liquids until up to 2 hours before the procedure. Do not eat or drink anything starting 2 hours before the procedure, or within the time period that your health care provider recommends. Bowel prep If you were prescribed a bowel prep to take  by mouth (orally) to clean out your colon: Take it as told by your health care provider. Starting the day before your procedure, you will need to drink a large amount of liquid medicine. The liquid will cause you to have many bowel movements of loose stool until your stool becomes almost clear or light green. If your skin or the opening between the buttocks (anus) gets irritated from diarrhea, you may relieve the irritation using: Wipes with medicine in them, such as adult wet wipes with aloe and vitamin E. A product to soothe skin, such as petroleum jelly. If you vomit while drinking the bowel prep: Take a break for up to 60 minutes. Begin the bowel prep again. Call your health care provider if you keep vomiting or you cannot take the bowel prep without vomiting. To clean out your colon, you may also be given: Laxative medicines. These help you have a bowel movement. Instructions for enema use. An enema is liquid medicine injected into your rectum. Medicines Ask your health care provider about: Changing or stopping your regular medicines or supplements. This is especially important if you are taking iron supplements, diabetes medicines, or blood thinners. Taking medicines such as aspirin and  ibuprofen. These medicines can thin your blood. Do not take these medicines unless your health care provider tells you to take them. Taking over-the-counter medicines, vitamins, herbs, and supplements. General instructions Ask your health care provider what steps will be taken to help prevent infection. These may include washing skin with a germ-killing soap. If you will be going home right after the procedure, plan to have a responsible adult: Take you home from the hospital or clinic. You will not be allowed to drive. Care for you for the time you are told. What happens during the procedure?  An IV will be inserted into one of your veins. You will be given a medicine to make you fall asleep (general anesthetic). You will lie on your side with your knees bent. A lubricant will be put on the tube. Then the tube will be: Inserted into your anus. Gently eased through all parts of your large intestine. Air will be sent into your colon to keep it open. This may cause some pressure or cramping. Images will be taken with the camera and will appear on a screen. A small tissue sample may be removed to be looked at under a microscope (biopsy). The tissue may be sent to a lab for testing if any signs of problems are found. If small polyps are found, they may be removed and checked for cancer cells. When the procedure is finished, the tube will be removed. The procedure may vary among health care providers and hospitals. What happens after the procedure? Your blood pressure, heart rate, breathing rate, and blood oxygen level will be monitored until you leave the hospital or clinic. You may have a small amount of blood in your stool. You may pass gas and have mild cramping or bloating in your abdomen. This is caused by the air that was used to open your colon during the exam. If you were given a sedative during the procedure, it can affect you for several hours. Do not drive or operate machinery until  your health care provider says that it is safe. It is up to you to get the results of your procedure. Ask your health care provider, or the department that is doing the procedure, when your results will be ready. Summary A colonoscopy is a procedure  to look at the entire large intestine. Follow instructions from your health care provider about eating and drinking before the procedure. If you were prescribed an oral bowel prep to clean out your colon, take it as told by your health care provider. During the colonoscopy, a flexible tube with a camera on its end is inserted into the anus and then passed into all parts of the large intestine. This information is not intended to replace advice given to you by your health care provider. Make sure you discuss any questions you have with your health care provider. Document Revised: 06/13/2021 Document Reviewed: 02/09/2021 Elsevier Patient Education  [place] Anesthesia refers to the techniques, procedures, and medicines that help a person stay safe and comfortable during surgery. Monitored anesthesia care, or sedation, is one type of anesthesia. You may have sedation if you do not need to be asleep for your procedure. Procedures that use sedation may include: Surgery to remove cataracts from your eyes. A dental procedure. A biopsy. This is when a tissue sample is removed and looked at under a microscope. You will be watched closely during your procedure. Your level of sedation or type of anesthesia may be changed to fit your needs. Tell a health care provider about: Any allergies you have. All medicines you are taking, including vitamins, herbs, eye drops, creams, and over-the-counter medicines. Any problems you or family members have had with anesthesia. Any bleeding problems you have. Any surgeries you have had. Any medical conditions or illnesses you have. This includes sleep apnea, cough, fever, or the  flu. Whether you are pregnant or may be pregnant. Whether you use cigarettes, alcohol, or drugs. Any use of steroids, whether by mouth or as a cream. What are the risks? Your health care provider will talk with you about risks. These may include: Getting too much medicine (oversedation). Nausea. Allergic reactions to medicines. Trouble breathing. If this happens, a breathing tube may be used to help you breathe. It will be removed when you are awake and breathing on your own. Heart trouble. Lung trouble. Confusion that gets better with time (emergence delirium). What happens before the procedure? When to stop eating and drinking Follow instructions from your health care provider about what you may eat and drink. These may include: 8 hours before your procedure Stop eating most foods. Do not eat meat, fried foods, or fatty foods. Eat only light foods, such as toast or crackers. All liquids are okay except energy drinks and alcohol. 6 hours before your procedure Stop eating. Drink only clear liquids, such as water, clear fruit juice, black coffee, plain tea, and sports drinks. Do not drink energy drinks or alcohol. 2 hours before your procedure Stop drinking all liquids. You may be allowed to take medicines with small sips of water. If you do not follow your health care provider's instructions, your procedure may be delayed or canceled. Medicines Ask your health care provider about: Changing or stopping your regular medicines. These include any diabetes medicines or blood thinners you take. Taking medicines such as aspirin and ibuprofen. These medicines can thin your blood. Do not take them unless your health care provider tells you to. Taking over-the-counter medicines, vitamins, herbs, and supplements. Testing You may have an exam or testing. You may have a blood or urine sample taken. General instructions Do not use any products that contain nicotine or tobacco for at least 4  weeks before the procedure. These products include cigarettes, chewing tobacco,  and vaping devices, such as e-cigarettes. If you need help quitting, ask your health care provider. If you will be going home right after the procedure, plan to have a responsible adult: Take you home from the hospital or clinic. You will not be allowed to drive. Care for you for the time you are told. What happens during the procedure?  Your blood pressure, heart rate, breathing, level of pain, and blood oxygen level will be monitored. An IV will be inserted into one of your veins. You may be given: A sedative. This helps you relax. Anesthesia. This will: Numb certain areas of your body. Make you fall asleep for surgery. You will be given medicines as needed to keep you comfortable. The more medicine you are given, the deeper your level of sedation will be. Your level of sedation may be changed to fit your needs. There are three levels of sedation: Mild sedation. At this level, you may feel awake and relaxed. You will be able to follow directions. Moderate sedation. At this level, you will be sleepy. You may not remember the procedure. Deep sedation. At this level, you will be asleep. You will not remember the procedure. How you get the medicines will depend on your age and the procedure. They may be given as: A pill. This may be taken by mouth (orally) or inserted into the rectum. An injection. This may be into a vein or muscle. A spray through the nose. After your procedure is over, the medicine will be stopped. The procedure may vary among health care providers and hospitals. What happens after the procedure? Your blood pressure, heart rate, breathing rate, and blood oxygen level will be monitored until you leave the hospital or clinic. You may feel sleepy, clumsy, or nauseous. You may not remember what happened during or after the procedure. Sedation can affect you for several hours. Do not drive or use  machinery until your health care provider says that it is safe. This information is not intended to replace advice given to you by your health care provider. Make sure you discuss any questions you have with your health care provider. Document Revised: 11/14/2021 Document Reviewed: 11/14/2021 Elsevier Patient Education  [place].

## 2022-08-15 NOTE — Progress Notes (Signed)
Patient is having an EGD/ED/TCS on 08/16/2022.  She has a brain stimulator and bladder stimulator which she can turn on and off.  Instructed patient to bring remotes/controls with her to the procedure to have on standby if needed.  Dr Briant Cedar and Dr Gala Romney aware.

## 2022-08-16 ENCOUNTER — Encounter: Payer: Self-pay | Admitting: *Deleted

## 2022-08-16 ENCOUNTER — Telehealth: Payer: Self-pay | Admitting: *Deleted

## 2022-08-16 NOTE — Telephone Encounter (Signed)
Note has been made available in [place], pt was notified via myChart.

## 2022-08-16 NOTE — Telephone Encounter (Signed)
Ok to provide work note as requested.

## 2022-08-16 NOTE — Telephone Encounter (Signed)
Pt called this morning stating she has a procedure scheduled for today. She has developed a cough, slight swollen glands, sneezing, vomited up water last night around 10:45 pm. She says "it's kinda clear" when going to the bathroom to have bm but no fever or shortness of breath. Does she need to reschedule? Spoke with provider and she said to call Endo to find out their policy. Called endo and was told that pt needed to reschedule.  Pt has been rescheduled until 09/14/22. She says she will need a note for yesterday, today and tomorrow. Please advise. Thank you

## 2022-08-17 ENCOUNTER — Other Ambulatory Visit (HOSPITAL_COMMUNITY): Payer: Self-pay | Admitting: Internal Medicine

## 2022-08-17 ENCOUNTER — Other Ambulatory Visit: Payer: Self-pay | Admitting: Gastroenterology

## 2022-08-17 DIAGNOSIS — R1030 Lower abdominal pain, unspecified: Secondary | ICD-10-CM

## 2022-08-17 MED ORDER — DICYCLOMINE HCL 10 MG PO CAPS: 10.0000 mg | ORAL_CAPSULE | Freq: Two times a day (BID) | ORAL | 0 refills | Status: DC | PRN

## 2022-08-18 ENCOUNTER — Ambulatory Visit (HOSPITAL_COMMUNITY)
Admission: RE | Admit: 2022-08-18 | Discharge: 2022-08-18 | Disposition: A | Discharge status: Home / Self Care | Payer: BC Managed Care – PPO | Source: Ambulatory Visit | Attending: Internal Medicine | Admitting: Internal Medicine

## 2022-08-18 DIAGNOSIS — R1030 Lower abdominal pain, unspecified: Secondary | ICD-10-CM | POA: Diagnosis not present

## 2022-08-18 MED ORDER — IOHEXOL 350 MG/ML SOLN
70.0000 mL | Freq: Once | INTRAVENOUS | Status: AC | PRN
  Administered 2022-08-18: 70 mL via INTRAVENOUS

## 2022-08-22 ENCOUNTER — Encounter: Payer: Self-pay | Admitting: Internal Medicine

## 2022-08-29 ENCOUNTER — Ambulatory Visit: Payer: BC Managed Care – PPO | Admitting: Gastroenterology

## 2022-09-01 ENCOUNTER — Encounter (HOSPITAL_COMMUNITY): Payer: BC Managed Care – PPO | Admitting: Occupational Therapy

## 2022-09-01 ENCOUNTER — Ambulatory Visit (HOSPITAL_COMMUNITY): Payer: BC Managed Care – PPO

## 2022-09-12 ENCOUNTER — Encounter (HOSPITAL_COMMUNITY): Payer: Self-pay

## 2022-09-12 ENCOUNTER — Other Ambulatory Visit: Payer: Self-pay

## 2022-09-12 ENCOUNTER — Encounter (HOSPITAL_COMMUNITY)
Admission: RE | Admit: 2022-09-12 | Discharge: 2022-09-12 | Disposition: A | Discharge status: Home / Self Care | Payer: BC Managed Care – PPO | Source: Ambulatory Visit | Attending: Internal Medicine | Admitting: Internal Medicine

## 2022-09-14 ENCOUNTER — Encounter (HOSPITAL_COMMUNITY)
Admission: RE | Disposition: A | Discharge status: Home / Self Care | Payer: Self-pay | Source: Home / Self Care | Attending: Internal Medicine

## 2022-09-14 ENCOUNTER — Ambulatory Visit (HOSPITAL_COMMUNITY): Payer: BC Managed Care – PPO | Admitting: Certified Registered"

## 2022-09-14 ENCOUNTER — Ambulatory Visit (HOSPITAL_COMMUNITY)
Admission: RE | Admit: 2022-09-14 | Discharge: 2022-09-14 | Disposition: A | Discharge status: Home / Self Care | Payer: BC Managed Care – PPO | Attending: Internal Medicine | Admitting: Internal Medicine

## 2022-09-14 ENCOUNTER — Encounter (HOSPITAL_COMMUNITY): Payer: Self-pay | Admitting: Internal Medicine

## 2022-09-14 DIAGNOSIS — K5900 Constipation, unspecified: Secondary | ICD-10-CM | POA: Insufficient documentation

## 2022-09-14 DIAGNOSIS — R1314 Dysphagia, pharyngoesophageal phase: Secondary | ICD-10-CM | POA: Insufficient documentation

## 2022-09-14 DIAGNOSIS — K219 Gastro-esophageal reflux disease without esophagitis: Secondary | ICD-10-CM | POA: Insufficient documentation

## 2022-09-14 DIAGNOSIS — Z83718 Other family history of colon polyps: Secondary | ICD-10-CM | POA: Diagnosis not present

## 2022-09-14 DIAGNOSIS — K640 First degree hemorrhoids: Secondary | ICD-10-CM | POA: Insufficient documentation

## 2022-09-14 DIAGNOSIS — Z1211 Encounter for screening for malignant neoplasm of colon: Secondary | ICD-10-CM

## 2022-09-14 DIAGNOSIS — Z79899 Other long term (current) drug therapy: Secondary | ICD-10-CM | POA: Diagnosis not present

## 2022-09-14 DIAGNOSIS — Z09 Encounter for follow-up examination after completed treatment for conditions other than malignant neoplasm: Secondary | ICD-10-CM | POA: Insufficient documentation

## 2022-09-14 DIAGNOSIS — R1310 Dysphagia, unspecified: Secondary | ICD-10-CM

## 2022-09-14 DIAGNOSIS — Z83719 Family history of colon polyps, unspecified: Secondary | ICD-10-CM | POA: Insufficient documentation

## 2022-09-14 DIAGNOSIS — Z8616 Personal history of COVID-19: Secondary | ICD-10-CM | POA: Diagnosis not present

## 2022-09-14 HISTORY — PX: MALONEY DILATION: SHX5535

## 2022-09-14 HISTORY — PX: COLONOSCOPY WITH PROPOFOL: SHX5780

## 2022-09-14 HISTORY — PX: ESOPHAGOGASTRODUODENOSCOPY (EGD) WITH PROPOFOL: SHX5813

## 2022-09-14 SURGERY — COLONOSCOPY WITH PROPOFOL
Anesthesia: General

## 2022-09-14 MED ORDER — LACTATED RINGERS IV SOLN: INTRAVENOUS | Status: DC | PRN

## 2022-09-14 MED ORDER — PHENYLEPHRINE 80 MCG/ML (10ML) SYRINGE FOR IV PUSH (FOR BLOOD PRESSURE SUPPORT)
PREFILLED_SYRINGE | INTRAVENOUS | Status: DC | PRN
  Administered 2022-09-14: 80 ug via INTRAVENOUS
  Administered 2022-09-14: 160 ug via INTRAVENOUS

## 2022-09-14 MED ORDER — LIDOCAINE HCL (CARDIAC) PF 100 MG/5ML IV SOSY
PREFILLED_SYRINGE | INTRAVENOUS | Status: DC | PRN
  Administered 2022-09-14: 50 mg via INTRAVENOUS

## 2022-09-14 MED ORDER — PROPOFOL 10 MG/ML IV BOLUS
INTRAVENOUS | Status: DC | PRN
  Administered 2022-09-14: 50 mg via INTRAVENOUS
  Administered 2022-09-14: 100 mg via INTRAVENOUS

## 2022-09-14 MED ORDER — LACTATED RINGERS IV SOLN: INTRAVENOUS | Status: DC

## 2022-09-14 MED ORDER — STERILE WATER FOR IRRIGATION IR SOLN
Status: DC | PRN
  Administered 2022-09-14: 50 mL

## 2022-09-14 MED ORDER — PROPOFOL 500 MG/50ML IV EMUL
INTRAVENOUS | Status: DC | PRN
  Administered 2022-09-14: 150 ug/kg/min via INTRAVENOUS

## 2022-09-14 NOTE — Anesthesia Preprocedure Evaluation (Signed)
Anesthesia Evaluation  Patient identified by MRN, date of birth, ID band Patient awake    Reviewed: Allergy & Precautions, H&P , NPO status , Patient's Chart, lab work & pertinent test results, reviewed documented beta blocker date and time   History of Anesthesia Complications (+) PONV and history of anesthetic complications  Airway Mallampati: II  TM Distance: >3 FB Neck ROM: full    Dental no notable dental hx.    Pulmonary neg pulmonary ROS   Pulmonary exam normal breath sounds clear to auscultation       Cardiovascular Exercise Tolerance: Good negative cardio ROS  Rhythm:regular Rate:Normal     Neuro/Psych  Headaches  Anxiety     negative neurological ROS  negative psych ROS   GI/Hepatic negative GI ROS, Neg liver ROS,GERD  ,,  Endo/Other  negative endocrine ROS    Renal/GU negative Renal ROS  negative genitourinary   Musculoskeletal   Abdominal   Peds  Hematology negative hematology ROS (+)   Anesthesia Other Findings   Reproductive/Obstetrics negative OB ROS                             Anesthesia Physical Anesthesia Plan  ASA: 2  Anesthesia Plan: General   Post-op Pain Management:    Induction:   PONV Risk Score and Plan: Propofol infusion  Airway Management Planned:   Additional Equipment:   Intra-op Plan:   Post-operative Plan:   Informed Consent: I have reviewed the patients History and Physical, chart, labs and discussed the procedure including the risks, benefits and alternatives for the proposed anesthesia with the patient or authorized representative who has indicated his/her understanding and acceptance.     Dental Advisory Given  Plan Discussed with: CRNA  Anesthesia Plan Comments:        Anesthesia Quick Evaluation

## 2022-09-14 NOTE — Transfer of Care (Signed)
Immediate Anesthesia Transfer of Care Note  Patient: Kristina Lewis  Procedure(s) Performed: COLONOSCOPY WITH PROPOFOL ESOPHAGOGASTRODUODENOSCOPY (EGD) WITH PROPOFOL MALONEY DILATION  Patient Location: Short Stay  Anesthesia Type:General  Level of Consciousness: awake  Airway & Oxygen Therapy: Patient Spontanous Breathing  Post-op Assessment: Report given to RN and Post -op Vital signs reviewed and stable  Post vital signs: Reviewed and stable  Last Vitals:  Vitals Value Taken Time  BP    Temp    Pulse    Resp    SpO2      Last Pain:  Vitals:   09/14/22 0923  TempSrc:   PainSc: 3          Complications: No notable events documented.

## 2022-09-14 NOTE — Discharge Instructions (Signed)
Colonoscopy Discharge Instructions  Read the instructions outlined below and refer to this sheet in the next few weeks. These discharge instructions provide you with general information on caring for yourself after you leave the hospital. Your doctor may also give you specific instructions. While your treatment has been planned according to the most current medical practices available, unavoidable complications occasionally occur. If you have any problems or questions after discharge, call Dr. Gala Romney at [phone]. ACTIVITY You may resume your regular activity, but move at a slower pace for the next 24 hours.  Take frequent rest periods for the next 24 hours.  Walking will help get rid of the air and reduce the bloated feeling in your belly (abdomen).  No driving for 24 hours (because of the medicine (anesthesia) used during the test).   Do not sign any important legal documents or operate any machinery for 24 hours (because of the anesthesia used during the test).  NUTRITION Drink plenty of fluids.  You may resume your normal diet as instructed by your doctor.  Begin with a light meal and progress to your normal diet. Heavy or fried foods are harder to digest and may make you feel sick to your stomach (nauseated).  Avoid alcoholic beverages for 24 hours or as instructed.  MEDICATIONS You may resume your normal medications unless your doctor tells you otherwise.  WHAT YOU CAN EXPECT TODAY Some feelings of bloating in the abdomen.  Passage of more gas than usual.  Spotting of blood in your stool or on the toilet paper.  IF YOU HAD POLYPS REMOVED DURING THE COLONOSCOPY: No aspirin products for 7 days or as instructed.  No alcohol for 7 days or as instructed.  Eat a soft diet for the next 24 hours.  FINDING OUT THE RESULTS OF YOUR TEST Not all test results are available during your visit. If your test results are not back during the visit, make an appointment with your caregiver to find out the  results. Do not assume everything is normal if you have not heard from your caregiver or the medical facility. It is important for you to follow up on all of your test results.  SEEK IMMEDIATE MEDICAL ATTENTION IF: You have more than a spotting of blood in your stool.  Your belly is swollen (abdominal distention).  You are nauseated or vomiting.  You have a temperature over 101.  You have abdominal pain or discomfort that is severe or gets worse throughout the day.    EGD Discharge instructions Please read the instructions outlined below and refer to this sheet in the next few weeks. These discharge instructions provide you with general information on caring for yourself after you leave the hospital. Your doctor may also give you specific instructions. While your treatment has been planned according to the most current medical practices available, unavoidable complications occasionally occur. If you have any problems or questions after discharge, please call your doctor. ACTIVITY You may resume your regular activity but move at a slower pace for the next 24 hours.  Take frequent rest periods for the next 24 hours.  Walking will help expel (get rid of) the air and reduce the bloated feeling in your abdomen.  No driving for 24 hours (because of the anesthesia (medicine) used during the test).  You may shower.  Do not sign any important legal documents or operate any machinery for 24 hours (because of the anesthesia used during the test).  NUTRITION Drink plenty of fluids.  You  may resume your normal diet.  Begin with a light meal and progress to your normal diet.  Avoid alcoholic beverages for 24 hours or as instructed by your caregiver.  MEDICATIONS You may resume your normal medications unless your caregiver tells you otherwise.  WHAT YOU CAN EXPECT TODAY You may experience abdominal discomfort such as a feeling of fullness or "gas" pains.  FOLLOW-UP Your doctor will discuss the results  of your test with you.  SEEK IMMEDIATE MEDICAL ATTENTION IF ANY OF THE FOLLOWING OCCUR: Excessive nausea (feeling sick to your stomach) and/or vomiting.  Severe abdominal pain and distention (swelling).  Trouble swallowing.  Temperature over 101 F (37.8 C).  Rectal bleeding or vomiting of blood.  \     your upper GI tract appeared normal today.  I was able to stretch your esophagus a little larger than last time.  This should improve your swallowing symptoms  Continue Nexium 40 mg daily.  No polyps found today in your colon  You do have hemorrhoids  Hemorrhoid information and GERD information provided   trial of Benefiber 1 tablespoon daily for 3 weeks; then increase to 2 tablespoons daily thereafter  Recommend repeat colonoscopy in 5 years for high risk screening  At patient request, I called Vanessa Barbara at [phone] -  reviewed findings and recommendations  Work excuse to be provided for yesterday and today.  Office visit with Neil Crouch in 3 months

## 2022-09-14 NOTE — Progress Notes (Addendum)
  September 14, 2022  Patient: Kristina Lewis  Date of Birth:   Date of Visit: 09/14/22    To Whom It May Concern:  Kristina Lewis was seen and treated in our short stay department on 09/14/22.  She was in pre-op testing area on 09/13/22.  Kristina Lewis  may return to work on 09/15/22. Marland Kitchen  Sincerely,

## 2022-09-14 NOTE — Op Note (Signed)
[facility] Patient Name: Kristina Lewis Procedure Date: 09/14/2022 8:53 AM MRN: [id number] Date of Birth: 1966-01-24 Attending MD: Norvel Richards , MD, [id number] CSN: [id number] Age: 57 Admit Type: Outpatient Procedure:                Colonoscopy Indications:              Colon cancer screening in patient at increased                            risk: Family history of 1st-degree relative with                            colon polyps before age 65 years Providers:                Norvel Richards, MD, Charlsie Quest. Theda Sers RN, RN,                            Wynonia Musty Tech, Technician Referring MD:              Medicines:                Propofol per Anesthesia Complications:            No immediate complications. Estimated Blood Loss:     Estimated blood loss: none. Procedure:                Pre-Anesthesia Assessment:                           - Prior to the procedure, a History and Physical                            was performed, and patient medications and                            allergies were reviewed. The patient's tolerance of                            previous anesthesia was also reviewed. The risks                            and benefits of the procedure and the sedation                            options and risks were discussed with the patient.                            All questions were answered, and informed consent                            was obtained. Prior Anticoagulants: The patient has                            taken no anticoagulant or antiplatelet agents. ASA  Grade Assessment: II - A patient with mild systemic                            disease. After reviewing the risks and benefits,                            the patient was deemed in satisfactory condition to                            undergo the procedure.                           After obtaining informed consent, the colonoscope                             was passed under direct vision. Throughout the                            procedure, the patient's blood pressure, pulse, and                            oxygen saturations were monitored continuously. The                            [id number]) scope was introduced through                            the anus and advanced to the the cecum, identified                            by appendiceal orifice and ileocecal valve. The                            colonoscopy was performed without difficulty. The                            patient tolerated the procedure well. The quality                            of the bowel preparation was adequate. The                            ileocecal valve, appendiceal orifice, and rectum                            were photographed. The quality of the bowel                            preparation was adequate. The ileocecal valve,                            appendiceal orifice, and rectum were photographed. Scope In: 9:38:52 AM Scope Out: 9:54:16 AM Scope Withdrawal Time: 0 hours 8 minutes 50 seconds  Total Procedure Duration:  0 hours 15 minutes 24 seconds  Findings:      The perianal and digital rectal examinations were normal.      Non-bleeding internal hemorrhoids were found during retroflexion. The       hemorrhoids were mild, small and Grade I (internal hemorrhoids that do       not prolapse).      The exam was otherwise without abnormality on direct and retroflexion       views. I attempted to intubate the terminal ileum. However, due to the       angulation was unable to get a good approach. Impression:               - Non-bleeding internal hemorrhoids.                           - The examination was otherwise normal on direct                            and retroflexion views.                           - No specimens collected. I suspect intermittent                            paper hematochezia secondary to hemorrhoids Moderate Sedation:       Moderate (conscious) sedation was personally administered by an       anesthesia professional. The following parameters were monitored: oxygen       saturation, heart rate, blood pressure, respiratory rate, EKG, adequacy       of pulmonary ventilation, and response to care. Recommendation:           - Patient has a contact number available for                            emergencies. The signs and symptoms of potential                            delayed complications were discussed with the                            patient. Return to normal activities tomorrow.                            Written discharge instructions were provided to the                            patient.                           - Advance diet as tolerated.                           - Continue present medications.                           - Repeat colonoscopy in 5 years for screening  purposes.                           - Return to GI office in 3 months. Hemorrhoid                            information provided. Begin Benefiber daily. Procedure Code(s):        --- Professional ---                           [id number], Colonoscopy, flexible; diagnostic, including                            collection of specimen(s) by brushing or washing,                            when performed (separate procedure) Diagnosis Code(s):        --- Professional ---                           Z83.71, Family history of colonic polyps                           K64.0, First degree hemorrhoids CPT copyright 2022 American [employer]. All rights reserved. The codes documented in this report are preliminary and upon coder review may  be revised to meet current compliance requirements. Cristopher Estimable. Thai Hemrick, MD Norvel Richards, MD 09/14/2022 9:59:14 AM This report has been signed electronically. Number of Addenda: 0

## 2022-09-14 NOTE — Anesthesia Procedure Notes (Signed)
Date/Time: 09/14/2022 9:30 AM  Performed by: Orlie Dakin, CRNAPre-anesthesia Checklist: Patient identified, Emergency Drugs available, Suction available and Patient being monitored Patient Re-evaluated:Patient Re-evaluated prior to induction Oxygen Delivery Method: Nasal cannula Placement Confirmation: positive ETCO2

## 2022-09-14 NOTE — Op Note (Signed)
[facility] Patient Name: Kristina Lewis Procedure Date: 09/14/2022 8:59 AM MRN: [id number] Date of Birth: May 14, 1966 Attending MD: Norvel Richards , MD, [id number] CSN: [id number] Age: 57 Admit Type: Outpatient Procedure:                Upper GI endoscopy Indications:              Dysphagia Providers:                Norvel Richards, MD, Charlsie Quest. Theda Sers RN, RN,                            Wynonia Musty Tech, Technician Referring MD:              Medicines:                Propofol per Anesthesia Complications:            No immediate complications. Estimated Blood Loss:     Estimated blood loss: none. Procedure:                Pre-Anesthesia Assessment:                           - Prior to the procedure, a History and Physical                            was performed, and patient medications and                            allergies were reviewed. The patient's tolerance of                            previous anesthesia was also reviewed. The risks                            and benefits of the procedure and the sedation                            options and risks were discussed with the patient.                            All questions were answered, and informed consent                            was obtained. Prior Anticoagulants: The patient has                            taken no anticoagulant or antiplatelet agents. ASA                            Grade Assessment: III - A patient with severe                            systemic disease. After reviewing the risks and  benefits, the patient was deemed in satisfactory                            condition to undergo the procedure.                           After obtaining informed consent, the endoscope was                            passed under direct vision. Throughout the                            procedure, the patient's blood pressure, pulse, and                            oxygen  saturations were monitored continuously. The                            [id number]) scope was introduced through the                            mouth, and advanced to the second part of duodenum.                            The upper GI endoscopy was accomplished without                            difficulty. The patient tolerated the procedure                            well. Scope In: 9:29:24 AM Scope Out: 9:33:28 AM Total Procedure Duration: 0 hours 4 minutes 4 seconds  Findings:      The examined esophagus was normal.      The entire examined stomach was normal.      The duodenal bulb and second portion of the duodenum were normal. The       scope was withdrawn. Dilation was performed with a Maloney dilator with       mild resistance at 56 Fr. The dilation site was examined following       endoscope reinsertion and showed no change. Estimated blood loss: none. Impression:               - Normal esophagus. Dilated.                           - Normal stomach.                           - Normal duodenal bulb and second portion of the                            duodenum.                           - No specimens collected. Moderate Sedation:      Moderate (conscious) sedation was personally administered by  an       anesthesia professional. The following parameters were monitored: oxygen       saturation, heart rate, blood pressure, respiratory rate, EKG, adequacy       of pulmonary ventilation, and response to care. Recommendation:           - Patient has a contact number available for                            emergencies. The signs and symptoms of potential                            delayed complications were discussed with the                            patient. Return to normal activities tomorrow.                            Written discharge instructions were provided to the                            patient.                           - Resume previous diet.                            - Continue present medications. Continue Nexium                            daily.                           - Return to my office in 4 years. See colonoscopy                            report. Procedure Code(s):        --- Professional ---                           [phone], Esophagogastroduodenoscopy, flexible,                            transoral; diagnostic, including collection of                            specimen(s) by brushing or washing, when performed                            (separate procedure)                           43450, Dilation of esophagus, by unguided sound or                            bougie, single or multiple passes Diagnosis Code(s):        --- Professional ---  R13.10, Dysphagia, unspecified CPT copyright 2022 American [employer] Association. All rights reserved. The codes documented in this report are preliminary and upon coder review may  be revised to meet current compliance requirements. Cristopher Estimable. Arienne Gartin, MD Norvel Richards, MD 09/14/2022 9:55:40 AM This report has been signed electronically. Number of Addenda: 0

## 2022-09-14 NOTE — H&P (Signed)
$'@LOGO'Y$ @   Primary Care Physician:  Redmond School, MD Primary Gastroenterologist:  Dr. Gala Lewis  Pre-Procedure History & Physical: HPI:  Kristina Lewis is a 57 y.o. female here for  further evaluation of esophageal dysphagia GERD well-controlled on Nexium when she takes it.  Alternating constipation and diarrhea recent norovirus.  Recent COVID.  Celiac screen colon biopsies previously negative for microscopic colitis.    Recent CT ordered by PCP negative for any acute problems.  Mother with colon polyps at age less than 4.  Past Medical History:  Diagnosis Date   Anxiety    Atypical chest pain 03/15/2017   Bulging disc    CERVICAL   GERD (gastroesophageal reflux disease)    Hemorrhoids    IC (interstitial cystitis)    bladder stimulator 10/2012   Interstitial cystitis    Left leg pain    Migraines    Palpitations 03/15/2017   Parkinson's disease    deep brain stimulator placed   PONV (postoperative nausea and vomiting)    PVC (premature ventricular contraction) 04/04/2017   Seasonal allergies    Urge incontinence     Past Surgical History:  Procedure Laterality Date   BIOPSY N/A 10/27/2014   Procedure: BIOPSY;  Surgeon: Kristina Dolin, MD;  Location: [facility];  Service: Endoscopy;  Laterality: N/A;  duodenal, esophageal   BREAST ENHANCEMENT SURGERY  AGE 89   CESAREAN SECTION  12/2000  &  01-26-2003   X2   COLONOSCOPY WITH PROPOFOL N/A 10/27/2014   Dr. Gala Lewis: Minimal internal heorrhoids;otherwise normal rectum, colon and terminal ileum.    cranial neurostimulator implant  04/2018   for deep brain simulation. [facility]   CYSTO/ URETHRAL DILATION/ HOD  X2  LAST ONE 12-19-2004   ESOPHAGEAL DILATION N/A 10/27/2014   Procedure: ESOPHAGEAL DILATION;  Surgeon: Kristina Dolin, MD;  Location: [facility];  Service: Endoscopy;  Laterality: N/AVenia Lewis 53   ESOPHAGOGASTRODUODENOSCOPY (EGD) WITH PROPOFOL N/A 10/27/2014   Dr. Gala Lewis: Normal EGD. status post maloney dilation follwed by esophageal and  duodenal biopsy. benign esophageal and duodenal biopsies   HEMORRHOID BANDING  2016   Dr.Lillyahna Lewis   INTERSTIM IMPLANT PLACEMENT N/A 10/08/2012   Procedure: Kristina Lewis IMPLANT FIRST STAGE;  Surgeon: Kristina Packer, MD;  Location: [facility];  Service: Urology;  Laterality: N/A;   INTERSTIM IMPLANT PLACEMENT N/A 10/08/2012   Procedure: Kristina Lewis IMPLANT SECOND STAGE;  Surgeon: Kristina Packer, MD;  Location: [place];  Service: Urology;  Laterality: N/A;   LASIK     bilateral   TONSILLECTOMY  AGE 22'S    Prior to Admission medications   Medication Sig Start Date End Date Taking? Authorizing Provider  ALPRAZolam Duanne Moron) 1 MG tablet Take 1.5 mg by mouth at bedtime.   Yes [provider]  ascorbic acid (VITAMIN C) 500 MG tablet Take 500 mg by mouth 2 (two) times daily.   Yes [provider]  b complex vitamins capsule Take 1 capsule by mouth daily.   Yes [provider]  baclofen (LIORESAL) 10 MG tablet Take 10 mg by mouth daily as needed for muscle spasms.   Yes [provider]  calcium carbonate (OSCAL) 1500 (600 Ca) MG TABS tablet Take 600 mg of elemental calcium by mouth 2 (two) times daily with a meal.   Yes [provider]  carbidopa-levodopa (SINEMET IR) 25-100 MG tablet Take 1 tablet by mouth 3 (three) times daily. 05/05/21  Yes [provider]  cetirizine (ZYRTEC)  10 MG tablet Take 10 mg by mouth daily as needed for allergies.   Yes [provider]  citalopram (CELEXA) 20 MG tablet Take 20 mg by mouth daily. 04/04/21  Yes [provider]  clotrimazole-betamethasone (LOTRISONE) cream Apply 1 Application topically daily as needed (irritation).   Yes [provider]  diclofenac Sodium (VOLTAREN) 1 % GEL Apply 1 application topically 3 (three) times daily as needed (pain).   Yes [provider]  dicyclomine (BENTYL) 10 MG capsule Take 1 capsule (10 mg total) by mouth 2  (two) times daily as needed (cramps and loose stool). 08/17/22  Yes Kristina Menghini, PA-C  esomeprazole (NEXIUM) 40 MG capsule TAKE 1 CAPSULE DAILY BEFORE BREAKFAST 12/30/19  Yes Annitta Needs, NP  ibuprofen (ADVIL,MOTRIN) 200 MG tablet Take 800 mg by mouth every 6 (six) hours as needed for pain.    Yes [provider]  mometasone (NASONEX) 50 MCG/ACT nasal spray Place 2 sprays into the nose daily as needed (allergies).   Yes [provider]  QUERCETIN PO Take 1 capsule by mouth daily as needed (cold symptoms or after being around sick people).   Yes [provider]  Tetrahydrozoline-Zn Sulfate (ALLERGY RELIEF EYE DROPS OP) Place 1 drop into both eyes 2 (two) times daily as needed (allergies).   Yes [provider]  TURMERIC CURCUMIN PO Take 1,500 mg by mouth daily.   Yes [provider]  zonisamide (ZONEGRAN) 100 MG capsule Take 300 mg by mouth at bedtime.   Yes [provider]    Allergies as of 08/09/2022 - Review Complete 08/09/2022  Allergen Reaction Noted   Other Diarrhea and Swelling 05/18/2014   Tioconazole Itching and Swelling 12/16/2014   Monistat [miconazole] Swelling 11/28/2011   Tape Itching [id number]   Silicone Itching and Rash 07/20/2022    Family History  Problem Relation Age of Onset   Diabetes Mother        Died, 2   Colon polyps Mother        less than age 67   Diabetes Father        Living, 58   Cancer Father        prostate   Hypertension Father    Atrial fibrillation Father    CAD Father    Hypertension Maternal Grandmother    Heart disease Maternal Grandmother    Diabetes Paternal Grandfather    Breast cancer Maternal Aunt    Cancer Cousin        MATERNAL   Stroke Paternal Grandmother    Colon cancer Neg Hx     Social History   Socioeconomic History   Marital status: Married    Spouse name: Not on file   Number of children: 3   Years of education: Not on file   Highest education level: Not on  file  Occupational History   Not on file  Tobacco Use   Smoking status: Never   Smokeless tobacco: Never  Vaping Use   Vaping Use: Never used  Substance and Sexual Activity   Alcohol use: No    Alcohol/week: 0.0 standard drinks of alcohol   Drug use: No   Sexual activity: Not Currently    Birth control/protection: Pill  Other Topics Concern   Not on file  Social History Narrative   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  She had three healthy children at home.  Husband is Environmental education officer.     Social Determinants of Health   Financial Resource Strain: Not on file  Food Insecurity: Not on file  Transportation Needs: Not on file  Physical Activity: Not on file  Stress: Not on file  Social Connections: Not on file  Intimate Partner Violence: Not on file    Review of Systems: See HPI, otherwise negative ROS  Physical Exam: BP 105/70   Temp 98.1 F (36.7 C) (Oral)   Resp 16   Ht '5\' 3"'$  (1.6 m)   Wt 56.8 kg   LMP 04/07/2016   SpO2 99%   BMI 22.18 kg/m  General:   Alert,  Well-developed, well-nourished, pleasant and cooperative in NAD Neck:  Supple; no masses or thyromegaly. No significant cervical adenopathy. Lungs:  Clear throughout to auscultation.   No wheezes, crackles, or rhonchi. No acute distress. Heart:  Regular rate and rhythm; no murmurs, clicks, rubs,  or gallops. Abdomen: Non-distended, normal bowel sounds.  Soft and nontender without appreciable mass or hepatosplenomegaly.  Pulses:  Normal pulses noted. Extremities:  Without clubbing or edema.  Impression/Plan:    57 year old lady with paper hematochezia positive family history of polyps at a young age.  Recurrent esophageal dysphagia.  Here for an EGD with esophageal dilation as feasible/appropriate and a diagnostic colonoscopy. The risks, benefits, limitations, imponderables and alternatives regarding both EGD and colonoscopy have been reviewed with the patient. Questions have been answered. All parties agreeable.       Notice: This  dictation was prepared with Dragon dictation along with smaller phrase technology. Any transcriptional errors that result from this process are unintentional and may not be corrected upon review.

## 2022-09-15 NOTE — Anesthesia Postprocedure Evaluation (Signed)
Anesthesia Post Note  Patient: Kristina Lewis  Procedure(s) Performed: COLONOSCOPY WITH PROPOFOL ESOPHAGOGASTRODUODENOSCOPY (EGD) WITH PROPOFOL [place]  Patient location during evaluation: Phase II Anesthesia Type: General Level of consciousness: awake Pain management: pain level controlled Vital Signs Assessment: post-procedure vital signs reviewed and stable Respiratory status: spontaneous breathing and respiratory function stable Cardiovascular status: blood pressure returned to baseline and stable Postop Assessment: no headache and no apparent nausea or vomiting Anesthetic complications: no Comments: Late entry   No notable events documented.   Last Vitals:  Vitals:   09/14/22 0839 09/14/22 0958  BP: 105/70 101/72  Pulse:  81  Resp: 16   Temp: 36.7 C   SpO2: 99% 100%    Last Pain:  Vitals:   09/14/22 0958  TempSrc:   PainSc: [place]

## 2022-09-22 ENCOUNTER — Telehealth: Payer: Self-pay

## 2022-09-22 NOTE — Telephone Encounter (Signed)
Pt called stating that she had her procedures last Thursday and that she isn't any better. Pt states that she is still having abdominal pain, back pain and her stools are full of mucous. Pt states that she doesn't have an appetite. Pt is wanting to know if she needs to come in before her 3 mth follow up.

## 2022-09-22 NOTE — Telephone Encounter (Signed)
Pt was made aware and an ov was made with Dr. Gala Romney on 10/03/2022.

## 2022-09-22 NOTE — Telephone Encounter (Signed)
Yes I would offer her an ov with Dr. Gala Romney or APP.

## 2022-09-26 ENCOUNTER — Encounter (HOSPITAL_COMMUNITY): Payer: Self-pay | Admitting: Internal Medicine

## 2022-10-03 ENCOUNTER — Telehealth: Payer: Self-pay | Admitting: Internal Medicine

## 2022-10-03 ENCOUNTER — Encounter: Payer: Self-pay | Admitting: Internal Medicine

## 2022-10-03 ENCOUNTER — Ambulatory Visit: Payer: BC Managed Care – PPO | Admitting: Internal Medicine

## 2022-10-03 VITALS — BP 91/58 | HR 72 | Temp 97.7°F | Ht 63.5 in | Wt 114.8 lb

## 2022-10-03 DIAGNOSIS — K219 Gastro-esophageal reflux disease without esophagitis: Secondary | ICD-10-CM

## 2022-10-03 DIAGNOSIS — R1319 Other dysphagia: Secondary | ICD-10-CM

## 2022-10-03 DIAGNOSIS — R634 Abnormal weight loss: Secondary | ICD-10-CM

## 2022-10-03 DIAGNOSIS — Z83719 Family history of colon polyps, unspecified: Secondary | ICD-10-CM

## 2022-10-03 DIAGNOSIS — R197 Diarrhea, unspecified: Secondary | ICD-10-CM

## 2022-10-03 MED ORDER — HYOSCYAMINE SULFATE ER 0.375 MG PO TB12: 0.3750 mg | ORAL_TABLET | Freq: Two times a day (BID) | ORAL | 11 refills | Status: DC

## 2022-10-03 NOTE — Telephone Encounter (Signed)
Patient

## 2022-10-03 NOTE — Telephone Encounter (Signed)
Patient stopped by and said that she left a message about the medication Dr. Gala Romney told her was over the counter she said it isn't.  And the other he wrote a script for was $78 and she got it this time but she said she can't afford that and won't get it when she runs out.

## 2022-10-03 NOTE — Telephone Encounter (Signed)
Pt was made aware and verbalized understanding.  

## 2022-10-03 NOTE — Telephone Encounter (Signed)
Pharmacy told pt Anusol is unavailable otc and pt states the levbid is too expensive. Pt picked up a one month supply but wont be able to afford moving forward.

## 2022-10-03 NOTE — Progress Notes (Unsigned)
Primary Care Physician:  Redmond School, MD Primary Gastroenterologist:  Dr.   Pre-Procedure History & Physical: HPI:  Kristina Lewis is a 57 y.o. female here for abdominal pain and diarrhea.  Intermittent nausea and vomiting.  History of Parkinson's with deep brain stimulation interstitial cystitis on bladder stimulator therapy  Recent GI evaluation included EGD for dysphagia.  Normal upper GI tract Maloney dilation performed empirically.  Patient states dysphagia resolved.  Says she is able to eat.  Whenever she eats ""runs right through her" celiac panel previously negative.  GI PE and C. difficile were positive for norovirus patient also had COVID previously however we did have a run of false positive COVID's which is a lab was investigating.  Colonoscopy normal.  5-year follow-up given family history of polyps  Has not been taking Bentyl regularly for abdominal discomfort states every time she eats food goes right through her causes abdominal pain states she has small-volume loose stools never gets finish anal canal is raw.  This lady is lost another 9 pounds since being seen here in February.  Recent CT demonstrates no intra-abdominal findings.  She has advanced degenerative spine disease.  She does note quite a bit of stress going through a divorce at this time.    Past Medical History:  Diagnosis Date   Anxiety    Atypical chest pain 03/15/2017   Bulging disc    CERVICAL   GERD (gastroesophageal reflux disease)    Hemorrhoids    IC (interstitial cystitis)    bladder stimulator 10/2012   Interstitial cystitis    Left leg pain    Migraines    Palpitations 03/15/2017   Parkinson's disease    deep brain stimulator placed   PONV (postoperative nausea and vomiting)    PVC (premature ventricular contraction) 04/04/2017   Seasonal allergies    Urge incontinence     Past Surgical History:  Procedure Laterality Date   BIOPSY N/A 10/27/2014   Procedure: BIOPSY;   Surgeon: Daneil Dolin, MD;  Location: [facility];  Service: Endoscopy;  Laterality: N/A;  duodenal, esophageal   BREAST ENHANCEMENT SURGERY  AGE 71   CESAREAN SECTION  12/2000  &  01-26-2003   X2   COLONOSCOPY WITH PROPOFOL N/A 10/27/2014   Dr. Gala Romney: Minimal internal heorrhoids;otherwise normal rectum, colon and terminal ileum.    COLONOSCOPY WITH PROPOFOL N/A 09/14/2022   Procedure: COLONOSCOPY WITH PROPOFOL;  Surgeon: Daneil Dolin, MD;  Location: [facility];  Service: Endoscopy;  Laterality: N/A;  145pm, asa 3, Has deep brain stimulator and urinary bladder stimulator   cranial neurostimulator implant  04/2018   for deep brain simulation. [facility]   CYSTO/ URETHRAL DILATION/ HOD  X2  LAST ONE 12-19-2004   ESOPHAGEAL DILATION N/A 10/27/2014   Procedure: ESOPHAGEAL DILATION;  Surgeon: Daneil Dolin, MD;  Location: [facility];  Service: Endoscopy;  Laterality: N/AVenia [name]   ESOPHAGOGASTRODUODENOSCOPY (EGD) WITH PROPOFOL N/A 10/27/2014   Dr. Gala Romney: Normal EGD. status post maloney dilation follwed by esophageal and duodenal biopsy. benign esophageal and duodenal biopsies   ESOPHAGOGASTRODUODENOSCOPY (EGD) WITH PROPOFOL N/A 09/14/2022   Procedure: ESOPHAGOGASTRODUODENOSCOPY (EGD) WITH PROPOFOL;  Surgeon: Daneil Dolin, MD;  [facility];  Service: Endoscopy;  Laterality: N/A;   HEMORRHOID BANDING  2016   Dr.Ademide Schaberg   INTERSTIM IMPLANT PLACEMENT N/A 10/08/2012   Procedure: Barrie Lyme IMPLANT FIRST STAGE;  Surgeon: Reece Packer, MD;  Location: [facility];  Service: Urology;  Laterality: N/A;   INTERSTIM IMPLANT PLACEMENT N/A 10/08/2012   Procedure: Barrie Lyme IMPLANT SECOND STAGE;  Surgeon: Reece Packer, MD;  Location: [place];  Service: Urology;  Laterality: N/A;   LASIK     bilateral   MALONEY DILATION N/A 09/14/2022   Procedure: Kristina Lewis DILATION;  Surgeon: Daneil Dolin, MD;  Location: [facility];  Service: Endoscopy;  Laterality: N/A;    TONSILLECTOMY  AGE 25'S    Prior to Admission medications   Medication Sig Start Date End Date Taking? Authorizing Provider  ALPRAZolam Duanne Moron) 1 MG tablet Take 1.5 mg by mouth at bedtime.   Yes [provider]  ascorbic acid (VITAMIN C) 500 MG tablet Take 500 mg by mouth daily.   Yes [provider]  carbidopa-levodopa (SINEMET IR) 25-100 MG tablet Take 1 tablet by mouth 3 (three) times daily. 05/05/21  Yes [provider]  citalopram (CELEXA) 20 MG tablet Take 20 mg by mouth daily. 04/04/21  Yes [provider]  esomeprazole (NEXIUM) 40 MG capsule TAKE 1 CAPSULE DAILY BEFORE BREAKFAST 12/30/19  Yes Annitta Needs, NP  TURMERIC CURCUMIN PO Take 1,500 mg by mouth daily.   Yes [provider]  zonisamide (ZONEGRAN) 100 MG capsule Take 300 mg by mouth at bedtime.   Yes [provider]  dicyclomine (BENTYL) 10 MG capsule Take 1 capsule (10 mg total) by mouth 2 (two) times daily as needed (cramps and loose stool). Patient not taking: Reported on 10/03/2022 08/17/22   Mahala Menghini, PA-C    Allergies as of 10/03/2022 - Review Complete 10/03/2022  Allergen Reaction Noted   Other Diarrhea and Swelling 05/18/2014   Tioconazole Itching and Swelling 12/16/2014   Monistat [miconazole] Swelling 11/28/2011   Tape Itching [id number]   Silicone Itching and Rash 07/20/2022    Family History  Problem Relation Age of Onset   Diabetes Mother        Died, 31   Colon polyps Mother        less than age 102   Diabetes Father        Living, 91   Cancer Father        prostate   Hypertension Father    Atrial fibrillation Father    CAD Father    Hypertension Maternal Grandmother    Heart disease Maternal Grandmother    Diabetes Paternal Grandfather    Breast cancer Maternal Aunt    Cancer Cousin        MATERNAL   Stroke Paternal Grandmother    Colon cancer Neg Hx     Social History   Socioeconomic History   Marital status: Married    Spouse  name: Not on file   Number of children: 3   Years of education: Not on file   Highest education level: Not on file  Occupational History   Not on file  Tobacco Use   Smoking status: Never   Smokeless tobacco: Never  Vaping Use   Vaping Use: Never used  Substance and Sexual Activity   Alcohol use: No    Alcohol/week: 0.0 standard drinks of alcohol   Drug use: No   Sexual activity: Not Currently    Birth control/protection: Pill  Other Topics Concern   Not on file  Social History Narrative   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  She had three healthy children at home.  Husband is Environmental education officer.     Social Determinants of Health   Financial Resource Strain: Not on file  Food Insecurity: Not on file  Transportation Needs: Not on file  Physical Activity: Not on file  Stress: Not on file  Social Connections: Not on file  Intimate Partner Violence: Not on file    Review of Systems: See HPI, otherwise negative ROS  Physical Exam: BP (!) 91/58 (BP Location: Right Arm, Patient Position: Sitting, Cuff Size: Normal)   Pulse 72   Temp 97.7 F (36.5 C) (Oral)   Ht 5' 3.5" (1.613 m)   Wt 114 lb 12.8 oz (Lewis.1 kg)   LMP 04/07/2016   SpO2 98%   BMI 20.02 kg/m  General:   Alert,  Well-developed, well-nourished, pleasant and cooperative in NAD Neck:  Supple; no masses or thyromegaly. No significant cervical adenopathy. Lungs:  Clear throughout to auscultation.   No wheezes, crackles, or rhonchi. No acute distress. Heart:  Regular rate and rhythm; no murmurs, clicks, rubs,  or gallops. Abdomen: Non-distended, normal bowel sounds.  Soft and nontender without appreciable mass or hepatosplenomegaly.  Pulses:  Normal pulses noted. Extremities:  Without clubbing or edema.  Impression/Plan: 57 year old lady with intermittent diarrhea abdominal cramps and nonbloody diarrhea.  Previously she was constipated. Weight loss is concerning.  Celiac panel negative stool samples negative for infection except norovirus.  This  positive stool assay goes back 2 months ago.  Even if she had norovirus, it would should be long gone by now leaving here would nothing more than a potential exacerbation of irritable bowel syndrome.  Again, weight loss is concerning in this scenario.  It is well-controlled on Nexium.  Dysphagia resolved with [facility] dilation.  Recent CT EGD and colonoscopy findings as noted.  She needs further evaluation before making a diagnosis of stressed induced exacerbation of IBS.  Recommendations:  Encourage oral intake  Sed rate, CRP, TSH, fecal elastase and fasting a.m. cortisol.  Stop Bentyl;  stop fiber supplementation  Over-the-counter Anusol cream for irritated anorectum.  Continue Nexium 40 mg daily.  Begin Levbid 1 tablet twice daily to lessen abdominal cramps and stool frequency.  Dispense 60 with 11 refills  Plan to see back in 6 weeks  As discussed, need follow-up with Dr. Gerarda Fraction regarding non-GI issues      Findings of negative CT, upper endoscopy and colonoscopy reviewed.  I am glad you are swallowing better since your esophagus was dilated.  Encourage oral intake  Stop Bentyl;  stop fiber supplementation  Over-the-counter Anusol cream for irritated anorectum.  Continue Nexium 40 mg daily.  Begin Levbid 1 tablet twice daily to lessen abdominal cramps and stool frequency.  Dispense 60 with 11 refills  Plan to see back in 6 weeks  As discussed, need follow-up with Dr. Gerarda Fraction regarding non-GI issues  Addendum: I personally reviewed the recent CT images with Dr. Thornton Papas on on 4/3.  GI tract looks good.  Absolutely no evidence of inflammation in her small intestine.  In addition, her mesenteric vasculature is wide open.    Notice: This dictation was prepared with Dragon dictation along with smaller phrase technology. Any transcriptional errors that result from this process are unintentional and may not be corrected upon review.

## 2022-10-03 NOTE — Patient Instructions (Signed)
It was good to see you again today!  Findings of negative CT, upper endoscopy and colonoscopy reviewed.  I am glad you are swallowing better since your esophagus was dilated.  Encourage oral intake  Stop Bentyl;  stop fiber supplementation  Over-the-counter Anusol cream for irritated anorectum.  Continue Nexium 40 mg daily.  Begin Levbid 1 tablet twice daily to lessen abdominal cramps and stool frequency.  Dispense 60 with 11 refills  Plan to see back in 6 weeks  As discussed, need follow-up with Dr. Gerarda Fraction regarding non-GI issues

## 2022-10-04 ENCOUNTER — Telehealth: Payer: Self-pay

## 2022-10-04 DIAGNOSIS — R634 Abnormal weight loss: Secondary | ICD-10-CM

## 2022-10-04 DIAGNOSIS — D509 Iron deficiency anemia, unspecified: Secondary | ICD-10-CM

## 2022-10-04 DIAGNOSIS — R194 Change in bowel habit: Secondary | ICD-10-CM

## 2022-10-04 DIAGNOSIS — R197 Diarrhea, unspecified: Secondary | ICD-10-CM

## 2022-10-04 NOTE — Telephone Encounter (Signed)
Labs were ordered, lmom for pt to return my call.  

## 2022-10-04 NOTE — Telephone Encounter (Signed)
Pt was made aware and verbalized understanding.  

## 2022-10-04 NOTE — Telephone Encounter (Signed)
-----   Message from Daneil Dolin, MD sent at 10/03/2022  4:29 PM EDT ----- After thoughts in this complicated lady that was seen here today.  We need some lab work.  Please arrange for her to get a TSH, CRP, sed rate, fasting a.m. cortisol and a fecal elastase please..  Thanks

## 2022-10-14 LAB — CORTISOL-AM, BLOOD: Cortisol - AM: 9.1 ug/dL (ref 6.2–19.4)

## 2022-10-14 LAB — C-REACTIVE PROTEIN: CRP: <1 mg/L (ref 0–10)

## 2022-10-14 LAB — PANCREATIC ELASTASE, FECAL: Pancreatic Elastase, Fecal: 190 ug Elast./g — ABNORMAL LOW (ref >200)

## 2022-10-14 LAB — TSH: TSH: 1.65 u[IU]/mL (ref 0.450–4.500)

## 2022-10-14 LAB — SEDIMENTATION RATE: Sed Rate: 2 mm/hr (ref 0–40)

## 2022-10-17 ENCOUNTER — Encounter: Payer: Self-pay | Admitting: Internal Medicine

## 2022-10-18 ENCOUNTER — Other Ambulatory Visit: Payer: Self-pay

## 2022-10-18 MED ORDER — PANCRELIPASE (LIP-PROT-AMYL) 36000-114000 UNITS PO CPEP: ORAL_CAPSULE | ORAL | 11 refills | Status: AC

## 2022-10-25 NOTE — Therapy (Signed)
OUTPATIENT PHYSICAL THERAPY NEURO EVALUATION   Patient Name: Kristina Lewis MRN: [id number] DOB:Aug 20, 1965, 57 y.o., female Today's Date: 10/26/2022   PCP: Elfredia Nevins MD REFERRING PROVIDER: Corie Chiquito, MD  END OF SESSION:  PT End of Session - 10/26/22 1038     Visit Number 1    Number of Visits 6    Date for PT Re-Evaluation 12/07/22    Authorization Type [employer]    PT Start Time 1030    PT Stop Time 1110    PT Time Calculation (min) 40 min    Activity Tolerance Patient tolerated treatment well    Behavior During Therapy [facility] for tasks assessed/performed             Past Medical History:  Diagnosis Date   Anxiety    Atypical chest pain 03/15/2017   Bulging disc    CERVICAL   GERD (gastroesophageal reflux disease)    Hemorrhoids    IC (interstitial cystitis)    bladder stimulator 10/2012   Interstitial cystitis    Left leg pain    Migraines    Palpitations 03/15/2017   Parkinson's disease    deep brain stimulator placed   PONV (postoperative nausea and vomiting)    PVC (premature ventricular contraction) 04/04/2017   Seasonal allergies    Urge incontinence    Past Surgical History:  Procedure Laterality Date   BIOPSY N/A 10/27/2014   Procedure: BIOPSY;  Surgeon: Corbin Ade, MD;  Location: [facility];  Service: Endoscopy;  Laterality: N/A;  duodenal, esophageal   BREAST ENHANCEMENT SURGERY  AGE 57   CESAREAN SECTION  12/2000  &  01-26-2003   X2   COLONOSCOPY WITH PROPOFOL N/A 10/27/2014   Dr. Jena Gauss: Minimal internal heorrhoids;otherwise normal rectum, colon and terminal ileum.    COLONOSCOPY WITH PROPOFOL N/A 09/14/2022   Procedure: COLONOSCOPY WITH PROPOFOL;  Surgeon: Corbin Ade, MD;  Location: [facility];  Service: Endoscopy;  Laterality: N/A;  145pm, asa 3, Has deep brain stimulator and urinary bladder stimulator   cranial neurostimulator implant  04/2018   for deep brain simulation. [facility]   CYSTO/ URETHRAL DILATION/ HOD  X2  LAST  ONE 12-19-2004   ESOPHAGEAL DILATION N/A 10/27/2014   Procedure: ESOPHAGEAL DILATION;  Surgeon: Corbin Ade, MD;  Location: [facility];  Service: Endoscopy;  Laterality: N/AElease Hashimoto 52   ESOPHAGOGASTRODUODENOSCOPY (EGD) WITH PROPOFOL N/A 10/27/2014   Dr. Jena Gauss: Normal EGD. status post maloney dilation follwed by esophageal and duodenal biopsy. benign esophageal and duodenal biopsies   ESOPHAGOGASTRODUODENOSCOPY (EGD) WITH PROPOFOL N/A 09/14/2022   Procedure: ESOPHAGOGASTRODUODENOSCOPY (EGD) WITH PROPOFOL;  Surgeon: Corbin Ade, MD;  [facility];  Service: Endoscopy;  Laterality: N/A;   HEMORRHOID BANDING  2016   Dr.Rourk   INTERSTIM IMPLANT PLACEMENT N/A 10/08/2012   Procedure: [name] IMPLANT FIRST STAGE;  Surgeon: Martina Sinner, MD;  Location: [facility];  Service: Urology;  Laterality: N/A;   INTERSTIM IMPLANT PLACEMENT N/A 10/08/2012   Procedure: [name] IMPLANT SECOND STAGE;  Surgeon: Martina Sinner, MD;  Location: [facility];  Service: Urology;  Laterality: N/A;   LASIK     bilateral   MALONEY DILATION N/A 09/14/2022   Procedure: [name] DILATION;  Surgeon: Corbin Ade, MD;  [facility];  Service: Endoscopy;  Laterality: N/A;   TONSILLECTOMY  AGE 73'S   Patient Active Problem List   Diagnosis Date Noted   Diarrhea 08/09/2022  Dysphagia 08/27/2019   Family history of polyps in the colon 08/27/2019   Pneumoperitoneum 08/12/2019   Dyspepsia 08/13/2018   PVC (premature ventricular contraction) 04/04/2017   Palpitations 03/15/2017   Atypical chest pain 03/15/2017   Globus sensation 05/17/2016   Abdominal pain 05/17/2016   First degree hemorrhoids    Loss of weight 10/15/2014   GERD (gastroesophageal reflux disease) 10/15/2014   Dysphagia, pharyngoesophageal phase 10/14/2014   Rectal bleeding 10/14/2014   External hemorrhoid 08/01/2012   Constipation 08/01/2012   Dysmenorrhea 02/15/2012   Leukorrhea  02/15/2012   Chronic headaches 02/15/2012   Perimenopausal vasomotor symptoms 11/28/2011   IC (interstitial cystitis) 08/17/2011    ONSET DATE: Jan of 2016  REFERRING DIAG: G20.A2 (ICD-10-CM) - Parkinson's disease without dyskinesia, with fluctuations  THERAPY DIAG:  Parkinson's disease with dyskinesia, with fluctuations - Plan: PT plan of care cert/re-cert  Difficulty in walking, not elsewhere classified - Plan: PT plan of care cert/re-cert  Rationale for Evaluation and Treatment: Rehabilitation  SUBJECTIVE:                                                                                                                                                                                             SUBJECTIVE STATEMENT: Patient states she is falling every day; reports weakness and balance; also having stomach issues; had recent Noravirus and COVD back in February; also has some pancreatic issues.  Having trouble with every day living; lives alone.   Pt accompanied by: self  PERTINENT HISTORY: Parkinsons  PAIN:  Are you having pain? Yes: NPRS scale: 6/10 Pain location: stomach is hurting, fingers are tingling Pain description: cramping Aggravating factors: diary products Relieving factors: heat  PRECAUTIONS: Fall  WEIGHT BEARING RESTRICTIONS: No  FALLS: Has patient fallen in last 6 months? Yes. Number of falls lots of falls every day  LIVING ENVIRONMENT: Lives with: lives with their family Lives in: House/apartment Stairs: Yes: Internal: 10 steps; on right going up, on left going up, and can reach both and External: 5 steps; on right going up, on left going up, and can reach both Has following equipment at home: Single point cane  PLOF: Independent  PATIENT GOALS: not to fall  OBJECTIVE:   DIAGNOSTIC FINDINGS:   COGNITION: Overall cognitive status: Within functional limits for tasks assessed   SENSATION: Fingers are tingly  COORDINATION:   EDEMA:   none     POSTURE: No Significant postural limitations  LOWER EXTREMITY ROM:     Active  Right Eval Left Eval  Hip flexion    Hip extension    Hip abduction    Hip adduction  Hip internal rotation    Hip external rotation    Knee flexion    Knee extension    Ankle dorsiflexion    Ankle plantarflexion    Ankle inversion    Ankle eversion     (Blank rows = not tested)  LOWER EXTREMITY MMT:  bunion left foot; painful  MMT Right Eval Left Eval  Hip flexion 4+ 4+  Hip extension    Hip abduction    Hip adduction    Hip internal rotation    Hip external rotation    Knee flexion    Knee extension 4+ 4+  Ankle dorsiflexion 4+ 4  Ankle plantarflexion    Ankle inversion    Ankle eversion    (Blank rows = not tested)  BED MOBILITY:  Not tested  TRANSFERS: Assistive device utilized: none  Sit to stand: SBA Stand to sit: SBA Chair to chair: SBA Floor:  not tested  STAIRS: Level of Assistance: CGA Stair Negotiation Technique: Alternating Pattern  with Bilateral Rails Number of Stairs: 4 to 8  Height of Stairs: 4" to 8"  Comments: foot slap on stairs  GAIT: Gait pattern: decreased arm swing- Left, decreased stance time- Left, and decreased hip/knee flexion- Left Distance walked: 100 ft Assistive device utilized: None Level of assistance: CGA and Total A Comments: flat foot   FUNCTIONAL TESTS:  5 times sit to stand: 15.99 sec occassional use of Ue's to assist Dynamic Gait Index: 15/24  DGI 1. Gait level surface (2) Mild Impairment: Walks 20', uses assistive devices, slower speed, mild gait deviations. 2. Change in gait speed (2) Mild Impairment: Is able to change speed but demonstrates mild gait deviations, or not gait deviations but unable to achieve a significant change in velocity, or uses an assistive device. 3. Gait with horizontal head turns (2) Mild Impairment: Performs head turns smoothly with slight change in gait velocity, i.e., minor  disruption to smooth gait path or uses walking aid. 4. Gait with vertical head turns 1) Moderate Impairment: Performs head turns with moderate change in gait velocity, slows down, staggers but recovers, can continue to walk. 5. Gait and pivot turn (2) Mild Impairment: Pivot turns safely in > 3 seconds and stops with no loss of balance. 6. Step over obstacle (2) Mild Impairment: Is able to step over box, but must slow down and adjust steps to clear box safely. 7. Step around obstacles (2) Mild Impairment: Is able to step around both cones, but must slow down and adjust steps to clear cones. 8. Stairs (2) Mild Impairment: Alternating feet, must use rail.  TOTAL SCORE: 15 / 24   PATIENT SURVEYS:  LEFS 10/80 12.5%  TODAY'S TREATMENT:                                                                                                                              DATE: 10/26/22 physical therapy evaluation and treatment    PATIENT EDUCATION: Education details:  Patient educated on exam findings, POC, scope of PT, HEP, and about local resources. Person educated: Patient Education method: Explanation, Demonstration, and Handouts Education comprehension: verbalized understanding, returned demonstration, verbal cues required, and tactile cues required   HOME EXERCISE PROGRAM: Access Code: [email or web address] URL: [email or web address] Date: 10/26/2022 Prepared by: [facility] - [facility]  Exercises - Sit to Stand Without Arm Support  - 2 x daily - 7 x weekly - 1 sets - 10 reps - Standing Tandem Balance with Counter Support  - 2 x daily - 7 x weekly - 1 sets - 3 reps - 20 sec hold - Standing March with Counter Support  - 2 x daily - 7 x weekly - 1 sets - 10 reps  GOALS: Goals reviewed with patient? No  SHORT TERM GOALS: Target date: 11/16/2022  patient will be independent with initial HEP  Baseline: Goal status: INITIAL  2.  Patient will self report 30% improvement to improve tolerance for  functional activity  Baseline:  Goal status: INITIAL   LONG TERM GOALS: Target date: 12/07/2022  Patient will be independent in self management strategies to improve quality of life and functional outcomes.  Baseline:  Goal status: INITIAL  2.  Patient will self report 50% improvement to improve tolerance for functional activity  Baseline:  Goal status: INITIAL  3.  Patient will increase her score on DGI by 4 points to demonstrate improved functional gait and balance Baseline: 15/24 Goal status: INITIAL  4.  Patient will improve 5 times sit to stand score from 15.99 sec sec to 12 sec to demonstrate improved functional mobility and increased lower extremity strength.  Baseline:  Goal status: INITIAL  5.  Patient will improve her score on LEFS by 20 points to demonstrate improved functional mobility Baseline: 10/80 Goal status: INITIAL  ASSESSMENT:  CLINICAL IMPRESSION: Patient is a 57 y.o. female who was seen today for physical therapy evaluation and treatment for Parkinsons dyskinesia, weakness and falls.   Patient demonstrates decreased strength, balance deficits and gait abnormalities which are negatively impacting patient ability to perform ADLs and functional mobility tasks. Patient will benefit from skilled physical therapy services to address these deficits to improve level of function with ADLs, functional mobility tasks, and reduce risk for falls.    OBJECTIVE IMPAIRMENTS: Abnormal gait, decreased activity tolerance, decreased balance, decreased coordination, decreased endurance, decreased knowledge of use of DME, decreased mobility, difficulty walking, decreased ROM, decreased strength, decreased safety awareness, hypomobility, increased fascial restrictions, impaired perceived functional ability, impaired flexibility, and impaired UE functional use.   ACTIVITY LIMITATIONS: carrying, lifting, bending, sitting, standing, squatting, sleeping, stairs, transfers, bed  mobility, bathing, toileting, dressing, reach over head, locomotion level, and caring for others  PARTICIPATION LIMITATIONS: meal prep, cleaning, laundry, driving, shopping, community activity, occupation, and yard work  [facility] POTENTIAL: Good  CLINICAL DECISION MAKING: Evolving/moderate complexity  EVALUATION COMPLEXITY: Moderate  PLAN:  PT FREQUENCY: 1x/week  PT DURATION: 6 weeks  PLANNED INTERVENTIONS: Therapeutic exercises, Therapeutic activity, Neuromuscular re-education, Balance training, Gait training, Patient/Family education, Joint manipulation, Joint mobilization, Stair training, Orthotic/Fit training, DME instructions, Aquatic Therapy, Dry Needling, Electrical stimulation, Spinal manipulation, Spinal mobilization, Cryotherapy, Moist heat, Compression bandaging, scar mobilization, Splintting, Taping, Traction, Ultrasound, Ionotophoresis 4mg /ml Dexamethasone, and Manual therapy   PLAN FOR NEXT SESSION: Review HEP and goals; also having OT; lower extremity strength, gait and balance   1:01 PM, 10/26/22 Eliav Mechling [place] MPT [facility] [place] [phone] Ph:[phone]

## 2022-10-26 ENCOUNTER — Other Ambulatory Visit: Payer: Self-pay

## 2022-10-26 ENCOUNTER — Ambulatory Visit (HOSPITAL_COMMUNITY): Payer: BC Managed Care – PPO | Attending: Neurology

## 2022-10-26 ENCOUNTER — Encounter (HOSPITAL_COMMUNITY): Payer: Self-pay | Admitting: Occupational Therapy

## 2022-10-26 ENCOUNTER — Ambulatory Visit (HOSPITAL_COMMUNITY): Payer: BC Managed Care – PPO | Admitting: Occupational Therapy

## 2022-10-26 DIAGNOSIS — G20B2 Parkinson's disease with dyskinesia, with fluctuations: Secondary | ICD-10-CM | POA: Insufficient documentation

## 2022-10-26 DIAGNOSIS — R29818 Other symptoms and signs involving the nervous system: Secondary | ICD-10-CM | POA: Insufficient documentation

## 2022-10-26 DIAGNOSIS — R262 Difficulty in walking, not elsewhere classified: Secondary | ICD-10-CM | POA: Insufficient documentation

## 2022-10-26 DIAGNOSIS — R278 Other lack of coordination: Secondary | ICD-10-CM | POA: Insufficient documentation

## 2022-10-26 NOTE — Patient Instructions (Signed)
Home Exercises Program Theraputty Exercises  Do the following exercises 1-2 times a day using your affected hand.  1. Roll putty into a ball.  2. Make into a pancake.  3. Roll putty into a roll.  4. Pinch along log with first finger and thumb.   5. Make into a ball.  6. Roll it back into a log.   7. Pinch using thumb and side of first finger.  8. Roll into a ball, then flatten into a pancake.  9. Using your fingers, make putty into a mountain.  10. Roll putty back into a ball and squeeze gently for 2-3 minutes.   

## 2022-10-26 NOTE — Therapy (Signed)
OUTPATIENT OCCUPATIONAL THERAPY ORTHO EVALUATION  Patient Name: Kristina Lewis MRN: [id number] DOB:April 27, 1966, 57 y.o., female Today's Date: 10/26/2022  PCP: Dr. Elfredia Nevins REFERRING PROVIDER: Dr. Nelida Gores  END OF SESSION:  OT End of Session - 10/26/22 1258     Visit Number 1    Number of Visits 6    Date for OT Re-Evaluation 12/07/22    Authorization Type [employer], $52 copay    Authorization Time Period no visit limit    OT Start Time 0946    OT Stop Time 1029    OT Time Calculation (min) 43 min    Activity Tolerance Patient tolerated treatment well    Behavior During Therapy [facility] for tasks assessed/performed             Past Medical History:  Diagnosis Date   Anxiety    Atypical chest pain 03/15/2017   Bulging disc    CERVICAL   GERD (gastroesophageal reflux disease)    Hemorrhoids    IC (interstitial cystitis)    bladder stimulator 10/2012   Interstitial cystitis    Left leg pain    Migraines    Palpitations 03/15/2017   Parkinson's disease    deep brain stimulator placed   PONV (postoperative nausea and vomiting)    PVC (premature ventricular contraction) 04/04/2017   Seasonal allergies    Urge incontinence    Past Surgical History:  Procedure Laterality Date   BIOPSY N/A 10/27/2014   Procedure: BIOPSY;  Surgeon: Corbin Ade, MD;  Location: [facility];  Service: Endoscopy;  Laterality: N/A;  duodenal, esophageal   BREAST ENHANCEMENT SURGERY  AGE 59   CESAREAN SECTION  12/2000  &  01-26-2003   X2   COLONOSCOPY WITH PROPOFOL N/A 10/27/2014   Dr. Jena Gauss: Minimal internal heorrhoids;otherwise normal rectum, colon and terminal ileum.    COLONOSCOPY WITH PROPOFOL N/A 09/14/2022   Procedure: COLONOSCOPY WITH PROPOFOL;  Surgeon: Corbin Ade, MD;  Location: [facility];  Service: Endoscopy;  Laterality: N/A;  145pm, asa 3, Has deep brain stimulator and urinary bladder stimulator   cranial neurostimulator implant  04/2018   for deep brain simulation. [facility]    CYSTO/ URETHRAL DILATION/ HOD  X2  LAST ONE 12-19-2004   ESOPHAGEAL DILATION N/A 10/27/2014   Procedure: ESOPHAGEAL DILATION;  Surgeon: Corbin Ade, MD;  Location: [facility];  Service: Endoscopy;  Laterality: N/AElease Hashimoto 52   ESOPHAGOGASTRODUODENOSCOPY (EGD) WITH PROPOFOL N/A 10/27/2014   Dr. Jena Gauss: Normal EGD. status post maloney dilation follwed by esophageal and duodenal biopsy. benign esophageal and duodenal biopsies   ESOPHAGOGASTRODUODENOSCOPY (EGD) WITH PROPOFOL N/A 09/14/2022   Procedure: ESOPHAGOGASTRODUODENOSCOPY (EGD) WITH PROPOFOL;  Surgeon: Corbin Ade, MD;  [facility];  Service: Endoscopy;  Laterality: N/A;   HEMORRHOID BANDING  2016   Dr.Rourk   INTERSTIM IMPLANT PLACEMENT N/A 10/08/2012   Procedure: [name] IMPLANT FIRST STAGE;  Surgeon: Martina Sinner, MD;  Location: [facility];  Service: Urology;  Laterality: N/A;   INTERSTIM IMPLANT PLACEMENT N/A 10/08/2012   Procedure: [name] IMPLANT SECOND STAGE;  Surgeon: Martina Sinner, MD;  Location: [facility];  Service: Urology;  Laterality: N/A;   LASIK     bilateral   MALONEY DILATION N/A 09/14/2022   Procedure: [name] DILATION;  Surgeon: Corbin Ade, MD;  [facility];  Service: Endoscopy;  Laterality: N/A;   TONSILLECTOMY  AGE 26'S   Patient Active Problem List   Diagnosis Date  Noted   Diarrhea 08/09/2022   Dysphagia 08/27/2019   Family history of polyps in the colon 08/27/2019   Pneumoperitoneum 08/12/2019   Dyspepsia 08/13/2018   PVC (premature ventricular contraction) 04/04/2017   Palpitations 03/15/2017   Atypical chest pain 03/15/2017   Globus sensation 05/17/2016   Abdominal pain 05/17/2016   First degree hemorrhoids    Loss of weight 10/15/2014   GERD (gastroesophageal reflux disease) 10/15/2014   Dysphagia, pharyngoesophageal phase 10/14/2014   Rectal bleeding 10/14/2014   External hemorrhoid 08/01/2012   Constipation 08/01/2012    Dysmenorrhea 02/15/2012   Leukorrhea 02/15/2012   Chronic headaches 02/15/2012   Perimenopausal vasomotor symptoms 11/28/2011   IC (interstitial cystitis) 08/17/2011    ONSET DATE: January 2016 (dx date)  REFERRING DIAG: Parkinson's Disease without dyskinesia with fluctuations  THERAPY DIAG:  Parkinson's disease with dyskinesia, with fluctuations  Other symptoms and signs involving the nervous system  Other lack of coordination  Rationale for Evaluation and Treatment: Rehabilitation  SUBJECTIVE:   SUBJECTIVE STATEMENT: S: I fall almost daily.  Pt accompanied by: self  PERTINENT HISTORY: Pt is a 57 y/o female who was diagnosed with early-onset Parkinson's Disease in 2016. Pt reports she is having a lot of difficulty with falling, fatigue, and ADL completion. Pt lives alone, only equipment is a SPC and grab bars at the commode. Pt reports she is falling almost daily, has difficulty with manipulating objects and is dropping items frequently.   PRECAUTIONS: Fall  WEIGHT BEARING RESTRICTIONS: No  PAIN:  Are you having pain? No  FALLS: Has patient fallen in last 6 months? Yes. 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 Pt reports she is sitting for LB dressing. Pt reports she drops everything, is unable to maintain grip on most items. Pt is having difficulty carrying everything while trying to use a cane. Zipping and buttoning are difficult.    FUNCTIONAL OUTCOME MEASURES: Quick Dash: 68.18  UPPER EXTREMITY ROM:       BUE  A/ROM is WNL throughout   UPPER EXTREMITY MMT:     MMT Right eval Left eval  Shoulder flexion 5/5 5/5  Shoulder abduction 4+/5 4+/5  Shoulder internal rotation 5/5 5/5  Shoulder external rotation 4/5 4-/5  Elbow flexion 5/5 5/5  Elbow extension 5/5 5/5  Wrist flexion 4+/5 4/5  Wrist extension 4+/5 4+/5  Wrist ulnar deviation 4/5 4/5  Wrist radial deviation 4/5 4/5  Wrist pronation 4/5 4/5  Wrist supination 4/5 4/5  (Blank rows = not tested)  HAND FUNCTION: Grip strength: Right: 35 lbs; Left: 44 lbs, Lateral pinch: Right: 10 lbs, Left: 10 lbs, and 3 point pinch: Right: 8 lbs, Left: 5 lbs  COORDINATION: 9 Hole Peg test: Right: 21.23 sec; Left: 28.32 sec  SENSATION: Pt reports tingling in bilateral hands and fingers  COGNITION: Overall cognitive status: Within functional limits for tasks assessed  OBSERVATIONS: Pt with compensatory muscle activation with MMT   TODAY'S TREATMENT:  DATE: N/A-eval only     PATIENT EDUCATION: Education details: grip and pinch strengthening-red theraputty Person educated: Patient Education method: Explanation, Demonstration, and Handouts Education comprehension: verbalized understanding and returned demonstration  HOME EXERCISE PROGRAM: Eval: grip and pinch strengthening  GOALS: Goals reviewed with patient? Yes  SHORT TERM GOALS: Target date: 11/16/22  Pt will be provided with and updated on HEP to improve independence and safety with ADL completion.   Goal status: INITIAL  2.  Pt will be educated on and provided with available resources for AE and/or DME to improve safety during bathing, dressing, and general household tasks.   Goal status: INITIAL  3.  Pt will be educated on and report daily use of energy conservation strategies to improve activity tolerance and safety required for daily ADL completion.    Goal status: INITIAL   LONG TERM GOALS: Target date: 12/07/22  Pt will increase bilateral grip strength by 10# and pinch strength by 3# to improve ability to grasp and maintain hold on items during functional use.   Goal status: INITIAL  2.  Pt will increase left hand fine motor coordination required for buttoning clothing and operating zippers by completing 9 hole peg test in under 25".    Goal status: INITIAL  3.  Pt will be educated on adaptive strategies and techniques to improve safety and reduce falls during dressing, bathing, and functional mobility tasks.   Goal status: INITIAL   ASSESSMENT:  CLINICAL IMPRESSION: Patient is a 57 y.o. female who was seen today for occupational therapy evaluation for Parkinson's Disease. Pt reports she was diagnosed in 2016, is having increased weakness and coordination difficulties and is falling almost daily. Pt lives alone and reports increased fatigue, has given up some ADLs and I/ADLs due to safety issues. Pt will benefit from skilled OT services to improve strength, coordination, and safety required for independence in functional task completion.    PERFORMANCE DEFICITS: in functional skills including ADLs, IADLs, coordination, dexterity, strength, pain, endurance, and UE functional use  IMPAIRMENTS: are limiting patient from ADLs, IADLs, rest and sleep, work, and leisure.   COMORBIDITIES: has no other co-morbidities that affects occupational performance. Patient will benefit from skilled OT to address above impairments and improve overall function.  MODIFICATION OR ASSISTANCE TO COMPLETE EVALUATION: No modification of tasks or assist necessary to complete an evaluation.  OT OCCUPATIONAL PROFILE AND HISTORY: Problem focused assessment: Including review of records relating to presenting problem.  CLINICAL DECISION MAKING: LOW - limited treatment options, no task modification necessary  REHAB POTENTIAL: Good  EVALUATION COMPLEXITY:  Low      PLAN:  OT FREQUENCY: 1x/week  OT DURATION: 6 weeks  PLANNED INTERVENTIONS: self care/ADL training, therapeutic exercise, therapeutic activity, patient/family education, and DME and/or AE instructions  RECOMMENDED OTHER SERVICES: PT  CONSULTED AND AGREED WITH PLAN OF CARE: Patient  PLAN FOR NEXT SESSION: Follow up on HEP, provide AE/DME recommendations for safety with bed mobility, bathing, functional reaching   Ezra Sites, OTR/L  [phone] 10/26/2022, 12:59 PM

## 2022-11-03 ENCOUNTER — Other Ambulatory Visit: Payer: Self-pay

## 2022-11-03 ENCOUNTER — Ambulatory Visit (HOSPITAL_COMMUNITY): Payer: BC Managed Care – PPO | Attending: Neurology

## 2022-11-03 DIAGNOSIS — G20B2 Parkinson's disease with dyskinesia, with fluctuations: Secondary | ICD-10-CM | POA: Insufficient documentation

## 2022-11-03 DIAGNOSIS — R29818 Other symptoms and signs involving the nervous system: Secondary | ICD-10-CM | POA: Diagnosis present

## 2022-11-03 DIAGNOSIS — R278 Other lack of coordination: Secondary | ICD-10-CM | POA: Insufficient documentation

## 2022-11-03 DIAGNOSIS — R262 Difficulty in walking, not elsewhere classified: Secondary | ICD-10-CM | POA: Diagnosis present

## 2022-11-03 MED ORDER — DICYCLOMINE HCL 10 MG PO CAPS: 10.0000 mg | ORAL_CAPSULE | Freq: Two times a day (BID) | ORAL | 0 refills | Status: DC

## 2022-11-03 MED ORDER — DICYCLOMINE HCL 10 MG PO CAPS: 10.0000 mg | ORAL_CAPSULE | Freq: Two times a day (BID) | ORAL | 0 refills | Status: DC | PRN

## 2022-11-03 NOTE — Telephone Encounter (Signed)
Pt called with an update stating that the levbid seemed to help, that it caused her mouth to be dry but that she won't be able to afford to pick up the script from here on out due to the cost.

## 2022-11-03 NOTE — Therapy (Signed)
OUTPATIENT PHYSICAL THERAPY NEURO TREATMENT   Patient Name: Kristina Lewis MRN: [id number] DOB:January 09, 1966, 57 y.o., female Today's Date: 11/03/2022   PCP: Elfredia Nevins MD REFERRING PROVIDER: Corie Chiquito, MD  END OF SESSION:  PT End of Session - 11/03/22 1204     Visit Number 2    Number of Visits 6    Date for PT Re-Evaluation 12/07/22    Authorization Type [employer]    Authorization - Visit Number 1    Authorization - Number of Visits 6    Progress Note Due on Visit 6    PT Start Time 1035    PT Stop Time 1115    PT Time Calculation (min) 40 min    Equipment Utilized During Treatment Gait belt    Activity Tolerance Patient tolerated treatment well    Behavior During Therapy [facility] for tasks assessed/performed              Past Medical History:  Diagnosis Date   Anxiety    Atypical chest pain 03/15/2017   Bulging disc    CERVICAL   GERD (gastroesophageal reflux disease)    Hemorrhoids    IC (interstitial cystitis)    bladder stimulator 10/2012   Interstitial cystitis    Left leg pain    Migraines    Palpitations 03/15/2017   Parkinson's disease    deep brain stimulator placed   PONV (postoperative nausea and vomiting)    PVC (premature ventricular contraction) 04/04/2017   Seasonal allergies    Urge incontinence    Past Surgical History:  Procedure Laterality Date   BIOPSY N/A 10/27/2014   Procedure: BIOPSY;  Surgeon: Corbin Ade, MD;  Location: [facility];  Service: Endoscopy;  Laterality: N/A;  duodenal, esophageal   BREAST ENHANCEMENT SURGERY  AGE 29   CESAREAN SECTION  12/2000  &  01-26-2003   X2   COLONOSCOPY WITH PROPOFOL N/A 10/27/2014   Dr. Jena Gauss: Minimal internal heorrhoids;otherwise normal rectum, colon and terminal ileum.    COLONOSCOPY WITH PROPOFOL N/A 09/14/2022   Procedure: COLONOSCOPY WITH PROPOFOL;  Surgeon: Corbin Ade, MD;  [facility]: [facility];  Service: Endoscopy;  Laterality: N/A;  145pm, asa 3, Has deep brain  stimulator and urinary bladder stimulator   cranial neurostimulator implant  04/2018   for deep brain simulation. [facility]   CYSTO/ URETHRAL DILATION/ HOD  X2  LAST ONE 12-19-2004   ESOPHAGEAL DILATION N/A 10/27/2014   Procedure: ESOPHAGEAL DILATION;  Surgeon: Corbin Ade, MD;  Location: [facility];  Service: Endoscopy;  Laterality: N/AElease Hashimoto 53   ESOPHAGOGASTRODUODENOSCOPY (EGD) WITH PROPOFOL N/A 10/27/2014   Dr. Jena Gauss: Normal EGD. status post maloney dilation follwed by esophageal and duodenal biopsy. benign esophageal and duodenal biopsies   ESOPHAGOGASTRODUODENOSCOPY (EGD) WITH PROPOFOL N/A 09/14/2022   Procedure: ESOPHAGOGASTRODUODENOSCOPY (EGD) WITH PROPOFOL;  Surgeon: Corbin Ade, MD;  [facility];  Service: Endoscopy;  Laterality: N/A;   HEMORRHOID BANDING  2016   Dr.Rourk   INTERSTIM IMPLANT PLACEMENT N/A 10/08/2012   Procedure: [name] IMPLANT FIRST STAGE;  Surgeon: Martina Sinner, MD;  Location: [facility];  Service: Urology;  Laterality: N/A;   INTERSTIM IMPLANT PLACEMENT N/A 10/08/2012   Procedure: [name] IMPLANT SECOND STAGE;  Surgeon: Martina Sinner, MD;  [facility];  Service: Urology;  Laterality: N/A;   LASIK     bilateral   MALONEY DILATION N/A 09/14/2022   Procedure: MALONEY DILATION;  Surgeon: Jena Gauss,  Gerrit Friends, MD;  [facility]: [facility];  Service: Endoscopy;  Laterality: N/A;   TONSILLECTOMY  AGE 35'S   Patient Active Problem List   Diagnosis Date Noted   Diarrhea 08/09/2022   Dysphagia 08/27/2019   Family history of polyps in the colon 08/27/2019   Pneumoperitoneum 08/12/2019   Dyspepsia 08/13/2018   PVC (premature ventricular contraction) 04/04/2017   Palpitations 03/15/2017   Atypical chest pain 03/15/2017   Globus sensation 05/17/2016   Abdominal pain 05/17/2016   First degree hemorrhoids    Loss of weight 10/15/2014   GERD (gastroesophageal reflux disease) 10/15/2014   Dysphagia,  pharyngoesophageal phase 10/14/2014   Rectal bleeding 10/14/2014   External hemorrhoid 08/01/2012   Constipation 08/01/2012   Dysmenorrhea 02/15/2012   Leukorrhea 02/15/2012   Chronic headaches 02/15/2012   Perimenopausal vasomotor symptoms 11/28/2011   IC (interstitial cystitis) 08/17/2011    ONSET DATE: Jan of 2016  REFERRING DIAG: G20.A2 (ICD-10-CM) - Parkinson's disease without dyskinesia, with fluctuations  THERAPY DIAG:  Parkinson's disease with dyskinesia, with fluctuations  Other symptoms and signs involving the nervous system  Difficulty in walking, not elsewhere classified  Rationale for Evaluation and Treatment: Rehabilitation  SUBJECTIVE:                                                                                                                                                                                             SUBJECTIVE STATEMENT: Pt reporting fall yesterday during outside while walking dog. Pain is not that bad. 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  Pt accompanied by: self  PERTINENT HISTORY: Parkinsons  PAIN:  Are you having pain? Yes: NPRS scale: 5/10 Pain location: stomach is hurting, fingers are tingling Pain description: cramping Aggravating factors: diary products Relieving factors: heat  PRECAUTIONS: Fall  WEIGHT BEARING RESTRICTIONS: No  FALLS: Has patient fallen in last 6 months? Yes. Number of falls lots of falls every day  LIVING ENVIRONMENT: Lives with: lives with their family Lives in: House/apartment Stairs: Yes: Internal: 10 steps; on right going up, on left going up, and can reach both and External: 5 steps; on right going up, on left going up, and can reach both Has following equipment at home: Single point cane  PLOF: Independent  PATIENT GOALS: not to fall  OBJECTIVE:   DIAGNOSTIC FINDINGS:   COGNITION: Overall cognitive status: Within functional limits for tasks assessed   SENSATION: Fingers are  tingly  COORDINATION:   EDEMA:  none  POSTURE: No Significant postural limitations  LOWER EXTREMITY ROM:     Active  Right Eval Left Eval  Hip flexion  Hip extension    Hip abduction    Hip adduction    Hip internal rotation    Hip external rotation    Knee flexion    Knee extension    Ankle dorsiflexion    Ankle plantarflexion    Ankle inversion    Ankle eversion     (Blank rows = not tested)  LOWER EXTREMITY MMT:  bunion left foot; painful  MMT Right Eval Left Eval  Hip flexion 4+ 4+  Hip extension    Hip abduction    Hip adduction    Hip internal rotation    Hip external rotation    Knee flexion    Knee extension 4+ 4+  Ankle dorsiflexion 4+ 4  Ankle plantarflexion    Ankle inversion    Ankle eversion    (Blank rows = not tested)  BED MOBILITY:  Not tested  TRANSFERS: Assistive device utilized: none  Sit to stand: SBA Stand to sit: SBA Chair to chair: SBA Floor:  not tested  STAIRS: Level of Assistance: CGA Stair Negotiation Technique: Alternating Pattern  with Bilateral Rails Number of Stairs: 4 to 8  Height of Stairs: 4" to 8"  Comments: foot slap on stairs  GAIT: Gait pattern: decreased arm swing- Left, decreased stance time- Left, and decreased hip/knee flexion- Left Distance walked: 100 ft Assistive device utilized: None Level of assistance: CGA and Total A Comments: flat foot   FUNCTIONAL TESTS:  5 times sit to stand: 15.99 sec occassional use of Ue's to assist Dynamic Gait Index: 15/24  DGI 1. Gait level surface (2) Mild Impairment: Walks 20', uses assistive devices, slower speed, mild gait deviations. 2. Change in gait speed (2) Mild Impairment: Is able to change speed but demonstrates mild gait deviations, or not gait deviations but unable to achieve a significant change in velocity, or uses an assistive device. 3. Gait with horizontal head turns (2) Mild Impairment: Performs head turns smoothly with slight change in gait  velocity, i.e., minor disruption to smooth gait path or uses walking aid. 4. Gait with vertical head turns 1) Moderate Impairment: Performs head turns with moderate change in gait velocity, slows down, staggers but recovers, can continue to walk. 5. Gait and pivot turn (2) Mild Impairment: Pivot turns safely in > 3 seconds and stops with no loss of balance. 6. Step over obstacle (2) Mild Impairment: Is able to step over box, but must slow down and adjust steps to clear box safely. 7. Step around obstacles (2) Mild Impairment: Is able to step around both cones, but must slow down and adjust steps to clear cones. 8. Stairs (2) Mild Impairment: Alternating feet, must use rail.  TOTAL SCORE: 15 / 24   PATIENT SURVEYS:  LEFS 10/80 12.5%  TODAY'S TREATMENT:  DATE:  11/03/2022  -Sit/stands 3 x 10 with yellow Gr2000 w/ blue foam pad with cues for large anterior weight shift -Forward/backwards walking with unilateral DB carry in parallel bars 8lb DB 60ft x 4 -Lateral sidesteps in parallel bars with unilateral DB carry 5lb DB 65ft x 4 -70ft parallel bars ambulation with vertical head oscillations x 5   10/26/22 physical therapy evaluation and treatment    PATIENT EDUCATION: Education details: Patient educated on exam findings, POC, scope of PT, HEP, and about local resources. Person educated: Patient Education method: Explanation, Demonstration, and Handouts Education comprehension: verbalized understanding, returned demonstration, verbal cues required, and tactile cues required   HOME EXERCISE PROGRAM: Access Code: [email or web address] URL: [email or web address] Date: 10/26/2022 Prepared by: [facility]  Exercises - Sit to Stand Without Arm Support  - 2 x daily - 7 x weekly - 1 sets - 10 reps - Standing Tandem Balance with Counter Support  - 2 x daily - 7 x weekly  - 1 sets - 3 reps - 20 sec hold - Standing March with Counter Support  - 2 x daily - 7 x weekly - 1 sets - 10 reps  GOALS: Goals reviewed with patient? No  SHORT TERM GOALS: Target date: 11/16/2022  patient will be independent with initial HEP  Baseline: Goal status: INITIAL  2.  Patient will self report 30% improvement to improve tolerance for functional activity  Baseline:  Goal status: INITIAL   LONG TERM GOALS: Target date: 12/07/2022  Patient will be independent in self management strategies to improve quality of life and functional outcomes.  Baseline:  Goal status: INITIAL  2.  Patient will self report 50% improvement to improve tolerance for functional activity  Baseline:  Goal status: INITIAL  3.  Patient will increase her score on DGI by 4 points to demonstrate improved functional gait and balance Baseline: 15/24 Goal status: INITIAL  4.  Patient will improve 5 times sit to stand score from 15.99 sec sec to 12 sec to demonstrate improved functional mobility and increased lower extremity strength.  Baseline:  Goal status: INITIAL  5.  Patient will improve her score on LEFS by 20 points to demonstrate improved functional mobility Baseline: 10/80 Goal status: INITIAL  ASSESSMENT:  CLINICAL IMPRESSION: Pt tolerating today's session well with focus on heavy strengthening and functional work while adjusting via balance reactions during functional tasks. Tolerating unilateral carries well, noting more difficulting in frontal plane in part to weakness and motor control. Educated on excessive weight shifts and movements throughout tasks at home, with education regarding typical movement patterns with parkinsons. Pt understood and eager to learn more about activities to improve balance.Patient will benefit from skilled physical therapy services to address these deficits to improve level of function with ADLs, functional mobility tasks, and reduce risk for falls.     OBJECTIVE IMPAIRMENTS: Abnormal gait, decreased activity tolerance, decreased balance, decreased coordination, decreased endurance, decreased knowledge of use of DME, decreased mobility, difficulty walking, decreased ROM, decreased strength, decreased safety awareness, hypomobility, increased fascial restrictions, impaired perceived functional ability, impaired flexibility, and impaired UE functional use.   ACTIVITY LIMITATIONS: carrying, lifting, bending, sitting, standing, squatting, sleeping, stairs, transfers, bed mobility, bathing, toileting, dressing, reach over head, locomotion level, and caring for others  PARTICIPATION LIMITATIONS: meal prep, cleaning, laundry, driving, shopping, community activity, occupation, and yard work  [facility] POTENTIAL: Good  CLINICAL DECISION MAKING: Evolving/moderate complexity  EVALUATION COMPLEXITY: Moderate  PLAN:  PT FREQUENCY: 1x/week  PT DURATION: 6 weeks  PLANNED INTERVENTIONS: Therapeutic exercises, Therapeutic activity, Neuromuscular re-education, Balance training, Gait training, Patient/Family education, Joint manipulation, Joint mobilization, Stair training, Orthotic/Fit training, DME instructions, Aquatic Therapy, Dry Needling, Electrical stimulation, Spinal manipulation, Spinal mobilization, Cryotherapy, Moist heat, Compression bandaging, scar mobilization, Splintting, Taping, Traction, Ultrasound, Ionotophoresis 4mg /ml Dexamethasone, and Manual therapy   PLAN FOR NEXT SESSION: Review HEP and goals; also having OT; lower extremity strength, gait and balance   Nelida Meuse PT, DPT Physical Therapist with [facility] [phone] office

## 2022-11-03 NOTE — Telephone Encounter (Signed)
Pt was made aware and verbalized understanding. Rx was sent to pharmacy on file.  

## 2022-11-08 ENCOUNTER — Encounter (HOSPITAL_COMMUNITY): Payer: Self-pay | Admitting: Occupational Therapy

## 2022-11-08 ENCOUNTER — Ambulatory Visit (HOSPITAL_COMMUNITY): Payer: BC Managed Care – PPO | Admitting: Occupational Therapy

## 2022-11-08 DIAGNOSIS — R29818 Other symptoms and signs involving the nervous system: Secondary | ICD-10-CM

## 2022-11-08 DIAGNOSIS — G20B2 Parkinson's disease with dyskinesia, with fluctuations: Secondary | ICD-10-CM | POA: Diagnosis not present

## 2022-11-08 DIAGNOSIS — R278 Other lack of coordination: Secondary | ICD-10-CM

## 2022-11-08 NOTE — Therapy (Signed)
OUTPATIENT OCCUPATIONAL THERAPY ORTHO TREATMENT NOTE  Patient Name: Kristina Lewis MRN: [id number] DOB:08/01/1965, 57 y.o., female Today's Date: 11/08/2022  PCP: Dr. Elfredia Nevins REFERRING PROVIDER: Dr. Nelida Gores  END OF SESSION:  OT End of Session - 11/08/22 1047     Visit Number 2    Number of Visits 6    Date for OT Re-Evaluation 12/07/22    Authorization Type [employer], $52 copay    Authorization Time Period no visit limit    OT Start Time 1035    OT Stop Time 1115    OT Time Calculation (min) 40 min    Activity Tolerance Patient tolerated treatment well    Behavior During Therapy [facility] for tasks assessed/performed             Past Medical History:  Diagnosis Date   Anxiety    Atypical chest pain 03/15/2017   Bulging disc    CERVICAL   GERD (gastroesophageal reflux disease)    Hemorrhoids    IC (interstitial cystitis)    bladder stimulator 10/2012   Interstitial cystitis    Left leg pain    Migraines    Palpitations 03/15/2017   Parkinson's disease    deep brain stimulator placed   PONV (postoperative nausea and vomiting)    PVC (premature ventricular contraction) 04/04/2017   Seasonal allergies    Urge incontinence    Past Surgical History:  Procedure Laterality Date   BIOPSY N/A 10/27/2014   Procedure: BIOPSY;  Surgeon: Corbin Ade, MD;  Location: [facility];  Service: Endoscopy;  Laterality: N/A;  duodenal, esophageal   BREAST ENHANCEMENT SURGERY  AGE 57   CESAREAN SECTION  12/2000  &  01-26-2003   X2   COLONOSCOPY WITH PROPOFOL N/A 10/27/2014   Dr. Jena Gauss: Minimal internal heorrhoids;otherwise normal rectum, colon and terminal ileum.    COLONOSCOPY WITH PROPOFOL N/A 09/14/2022   Procedure: COLONOSCOPY WITH PROPOFOL;  Surgeon: Corbin Ade, MD;  [facility];  Service: Endoscopy;  Laterality: N/A;  145pm, asa 3, Has deep brain stimulator and urinary bladder stimulator   cranial neurostimulator implant  04/2018   for deep brain simulation.  [facility]   CYSTO/ URETHRAL DILATION/ HOD  X2  LAST ONE 12-19-2004   ESOPHAGEAL DILATION N/A 10/27/2014   Procedure: ESOPHAGEAL DILATION;  Surgeon: Corbin Ade, MD;  Location: [facility];  Service: Endoscopy;  Laterality: N/AElease Hashimoto 57   ESOPHAGOGASTRODUODENOSCOPY (EGD) WITH PROPOFOL N/A 10/27/2014   Dr. Jena Gauss: Normal EGD. status post maloney dilation follwed by esophageal and duodenal biopsy. benign esophageal and duodenal biopsies   ESOPHAGOGASTRODUODENOSCOPY (EGD) WITH PROPOFOL N/A 09/14/2022   Procedure: ESOPHAGOGASTRODUODENOSCOPY (EGD) WITH PROPOFOL;  Surgeon: Corbin Ade, MD;  [facility];  Service: Endoscopy;  Laterality: N/A;   HEMORRHOID BANDING  2016   Dr.Rourk   INTERSTIM IMPLANT PLACEMENT N/A 10/08/2012   Procedure: [name] IMPLANT FIRST STAGE;  Surgeon: Martina Sinner, MD;  Location: [facility];  Service: Urology;  Laterality: N/A;   INTERSTIM IMPLANT PLACEMENT N/A 10/08/2012   Procedure: [name] IMPLANT SECOND STAGE;  Surgeon: Martina Sinner, MD;  Location: [facility];  Service: Urology;  Laterality: N/A;   LASIK     bilateral   MALONEY DILATION N/A 09/14/2022   Procedure: [name] DILATION;  Surgeon: Corbin Ade, MD;  [facility];  Service: Endoscopy;  Laterality: N/A;   TONSILLECTOMY  AGE 57   Patient Active Problem List   Diagnosis  Date Noted   Diarrhea 08/09/2022   Dysphagia 08/27/2019   Family history of polyps in the colon 08/27/2019   Pneumoperitoneum 08/12/2019   Dyspepsia 08/13/2018   PVC (premature ventricular contraction) 04/04/2017   Palpitations 03/15/2017   Atypical chest pain 03/15/2017   Globus sensation 05/17/2016   Abdominal pain 05/17/2016   First degree hemorrhoids    Loss of weight 10/15/2014   GERD (gastroesophageal reflux disease) 10/15/2014   Dysphagia, pharyngoesophageal phase 10/14/2014   Rectal bleeding 10/14/2014   External hemorrhoid 08/01/2012   Constipation 08/01/2012    Dysmenorrhea 02/15/2012   Leukorrhea 02/15/2012   Chronic headaches 02/15/2012   Perimenopausal vasomotor symptoms 11/28/2011   IC (interstitial cystitis) 08/17/2011    ONSET DATE: January 2016 (dx date)  REFERRING DIAG: Parkinson's Disease without dyskinesia with fluctuations  THERAPY DIAG:  Other lack of coordination  Parkinson's disease with dyskinesia, with fluctuations  Other symptoms and signs involving the nervous system  Rationale for Evaluation and Treatment: Rehabilitation  SUBJECTIVE:   SUBJECTIVE STATEMENT: S: I fell out of bed last night Pt accompanied by: self  PERTINENT HISTORY: Pt is a 57 y/o female who was diagnosed with early-onset Parkinson's Disease in 2016. Pt reports she is having a lot of difficulty with falling, fatigue, and ADL completion. Pt lives alone, only equipment is a SPC and grab bars at the commode. Pt reports she is falling almost daily, has difficulty with manipulating objects and is dropping items frequently.   PRECAUTIONS: Fall  WEIGHT BEARING RESTRICTIONS: No  PAIN:  Are you having pain? No  FALLS: Has patient fallen in last 6 months? Yes. 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 Pt reports she is sitting for LB dressing. Pt reports she drops everything, is unable to maintain grip on most items. Pt is having difficulty carrying everything while trying to use a cane. Zipping and buttoning are difficult.    FUNCTIONAL OUTCOME MEASURES: Quick Dash: 68.18  UPPER EXTREMITY ROM:        BUE A/ROM is WNL throughout   UPPER EXTREMITY MMT:     MMT Right eval Left eval  Shoulder flexion 5/5 5/5  Shoulder abduction 4+/5 4+/5  Shoulder internal rotation 5/5 5/5  Shoulder external rotation 4/5 4-/5  Elbow flexion 5/5 5/5  Elbow extension 5/5 5/5  Wrist flexion 4+/5 4/5  Wrist extension 4+/5 4+/5  Wrist ulnar deviation 4/5 4/5  Wrist radial deviation 4/5 4/5  Wrist pronation 4/5 4/5  Wrist supination 4/5 4/5  (Blank rows = not tested)  HAND FUNCTION: Grip strength: Right: 35 lbs; Left: 44 lbs, Lateral pinch: Right: 10 lbs, Left: 10 lbs, and 3 point pinch: Right: 8 lbs, Left: 5 lbs  COORDINATION: 9 Hole Peg test: Right: 21.23 sec; Left: 28.32 sec  SENSATION: Pt reports tingling in bilateral hands and fingers  COGNITION: Overall cognitive status: Within functional limits for tasks assessed  OBSERVATIONS: Pt with compensatory muscle activation with MMT   TODAY'S TREATMENT:  DATE:  - Safety awareness: putting on her backpack instead of carrying it, slowing down as she stepped with her cane -A/ROM: sitting, flexion, abduction, protraction, horizontal abduction, er/IR, x15 -Shoulder Strengthening: 2lb dumbbells, flexion, abduction, protraction, horizontal abduction, er/IR, x15 -Therapy Ball Exercises: V ups, overhead presses, chest presses, circles both directions, x12 -Energy Conservation: planning out her day the day before, making a schedule so she doesn't rush, have blocks of time to rest and blocks of time to complete a certain task.   PATIENT EDUCATION: Education details: Public relations account executive with dumbbells Person educated: Patient Education method: Explanation, Demonstration, and Handouts Education comprehension: verbalized understanding and returned demonstration  HOME EXERCISE PROGRAM: Eval: grip and pinch  strengthening 5/8: Shoulder Strengthening with dumbbells  GOALS: Goals reviewed with patient? Yes  SHORT TERM GOALS: Target date: 11/16/22  Pt will be provided with and updated on HEP to improve independence and safety with ADL completion.   Goal status: IN PROGRESS  2.  Pt will be educated on and provided with available resources for AE and/or DME to improve safety during bathing, dressing, and general household tasks.   Goal status: IN PROGRESS  3.  Pt will be educated on and report daily use of energy conservation strategies to improve activity tolerance and safety required for daily ADL completion.   Goal status: IN PROGRESS   LONG TERM GOALS: Target date: 12/07/22  Pt will increase bilateral grip strength by 10# and pinch strength by 3# to improve ability to grasp and maintain hold on items during functional use.   Goal status: IN PROGRESS  2.  Pt will increase left hand fine motor coordination required for buttoning clothing and operating zippers by completing 9 hole peg test in under 25".    Goal status: IN PROGRESS  3.  Pt will be educated on adaptive strategies and techniques to improve safety and reduce falls during dressing, bathing, and functional mobility tasks.   Goal status: IN PROGRESS   ASSESSMENT:  CLINICAL IMPRESSION: This session, OT focused on overall safety awareness and energy conservation. Pt reports she falls a lot due to rushing around and going to fast. OT Provided education on using her cane every time she gets up and moves around, in order to take slower steps. To assist with less rushing and having time to complete tasks, along with taking breaks, pt should schedule her day, a day in advance and plan what blocks of time she needs to get things done and what blocks of time she can rest. Pt reports understanding and was able to come up with multiple ideas for how to work her schedule, including using a planner more. Additionally this session, pt worked  on Audiological scientist, where she focused on big controlled movements. She reported increased fatigue with these exercises, requiring multiple short rest breaks. Verbal and tactile cuing provided for safety, technique, and body mechanics.   PERFORMANCE DEFICITS: in functional skills including ADLs, IADLs, coordination, dexterity, strength, pain, endurance, and UE functional use   PLAN:  OT FREQUENCY: 1x/week  OT DURATION: 6 weeks  PLANNED INTERVENTIONS: self care/ADL training, therapeutic exercise, therapeutic activity, patient/family education, and DME and/or AE instructions  RECOMMENDED OTHER SERVICES: PT  CONSULTED AND AGREED WITH PLAN OF CARE: Patient  PLAN FOR NEXT SESSION: Follow up on HEP, provide AE/DME recommendations for safety with bed mobility, bathing, functional reaching   Trish Mage, OTR/L [phone] 11/08/2022, 10:50 AM

## 2022-11-11 ENCOUNTER — Encounter: Payer: Self-pay | Admitting: Internal Medicine

## 2022-11-14 ENCOUNTER — Emergency Department (HOSPITAL_COMMUNITY): Payer: Worker's Compensation

## 2022-11-14 ENCOUNTER — Other Ambulatory Visit: Payer: Self-pay

## 2022-11-14 ENCOUNTER — Telehealth: Payer: Self-pay | Admitting: *Deleted

## 2022-11-14 ENCOUNTER — Encounter: Payer: Self-pay | Admitting: *Deleted

## 2022-11-14 ENCOUNTER — Ambulatory Visit: Payer: BC Managed Care – PPO | Admitting: Gastroenterology

## 2022-11-14 ENCOUNTER — Emergency Department (HOSPITAL_COMMUNITY)
Admission: EM | Admit: 2022-11-14 | Discharge: 2022-11-14 | Disposition: A | Discharge status: Home / Self Care | Payer: Worker's Compensation | Attending: Emergency Medicine | Admitting: Emergency Medicine

## 2022-11-14 ENCOUNTER — Encounter: Payer: Self-pay | Admitting: Gastroenterology

## 2022-11-14 VITALS — BP 101/66 | HR 80 | Temp 97.7°F | Ht 63.0 in | Wt 107.6 lb

## 2022-11-14 DIAGNOSIS — Y990 Civilian activity done for income or pay: Secondary | ICD-10-CM | POA: Insufficient documentation

## 2022-11-14 DIAGNOSIS — R197 Diarrhea, unspecified: Secondary | ICD-10-CM

## 2022-11-14 DIAGNOSIS — R634 Abnormal weight loss: Secondary | ICD-10-CM | POA: Diagnosis not present

## 2022-11-14 DIAGNOSIS — S0990XA Unspecified injury of head, initial encounter: Secondary | ICD-10-CM | POA: Insufficient documentation

## 2022-11-14 DIAGNOSIS — S0083XA Contusion of other part of head, initial encounter: Secondary | ICD-10-CM

## 2022-11-14 DIAGNOSIS — R1084 Generalized abdominal pain: Secondary | ICD-10-CM

## 2022-11-14 DIAGNOSIS — W01198A Fall on same level from slipping, tripping and stumbling with subsequent striking against other object, initial encounter: Secondary | ICD-10-CM | POA: Diagnosis not present

## 2022-11-14 DIAGNOSIS — S01511A Laceration without foreign body of lip, initial encounter: Secondary | ICD-10-CM

## 2022-11-14 DIAGNOSIS — S0993XA Unspecified injury of face, initial encounter: Secondary | ICD-10-CM | POA: Diagnosis present

## 2022-11-14 MED ORDER — IBUPROFEN 800 MG PO TABS
800.0000 mg | ORAL_TABLET | Freq: Once | ORAL | Status: AC
  Administered 2022-11-14: 800 mg via ORAL
  Filled 2022-11-14: qty 1

## 2022-11-14 NOTE — Discharge Instructions (Addendum)
Return if any problems.

## 2022-11-14 NOTE — Patient Instructions (Signed)
Continue Creon 2 with meals and 1 with snacks for exocrine pancreatic insufficiency. Continue bentyl up to twice daily as needed for abdominal cramps. Continue esomeprazole 40mg  daily before breakfast for reflux. Complete labs prior to CT scan. CTA abdomen to look at blood vessels that supply blood to your intestines.

## 2022-11-14 NOTE — Progress Notes (Signed)
GI Office Note    Referring Provider: Elfredia Nevins, MD Primary Care Physician:  Elfredia Nevins, MD  Primary Gastroenterologist: Roetta Sessions, MD   Chief Complaint   Chief Complaint  Patient presents with   Diarrhea    Stools are not formed they are very mushy. Was not taking the Creon correctly. Losing weight    History of Present Illness   Kristina Lewis is a 57 y.o. female presenting today for urgent office visit.  Last seen in the office in April.  History of abdominal pain, diarrhea, intermittent nausea and vomiting.  History of Parkinson's with deep brain stimulation, interstitial cystitis on bladder stimulator therapy.  Recent GI evaluation including EGD for dysphagia.  She had a normal upper GI tract, Maloney dilation performed empirically with resolution of dysphagia.  Celiac serologies previously negative.  Colonoscopy normal.  5-year follow-up plan given family history of polyps.  Since her last office visit labs showed CRP less than 1, sed rate 2, fasting a.m. cortisol level 9.1, pancreatic elastase of 190 low.  Started on Creon 2 with meals with snacks.  Patient was not taking with food.  She continues on Bentyl 10 mg twice daily as needed.  Did not switch over to [facility] after last office visit, unclear reason.  She feels like Bentyl does help with her abdominal cramping.  Since she has been separated, she notes that she does not eat many meals.  Difficult to prepare food for one. Children are grown.  Typically has banana/granola bar for breakfast.  May have a small lunch.  Biggest meal of the day is dinner.  She has to avoid wheat flour, celebrate, tomatoes, watermelon due to food allergies/sensitivities.  She complains of postprandial abdominal pain, does not seem to matter what she eats.  Various locations for her pain.  Sometimes around her left lower quadrant where her battery pack for her bladder stimulator is located.  Also in the lower abdomen, mid abdomen around  the umbilicus.  Sometimes radiates into the epigastric region.  Sometimes pain can be severe, doubles her over.  She has missed work because of this.  Often will have the urge to have a bowel movement but is unable to pass much stool.  Stools are yellow, runny most of the time.  Occasional bright red blood per rectum.  Often sticky in consistency.  Has nocturnal stools at times.  Can have anywhere from zero to 5 stools/day.  Has a lot of nausea.  No vomiting.  Some heartburn.  He would like her records from [name] shared with her neurologist.    Notes that her left breast implant deflated sometime around the first of the year.  She has been seen by surgery, implant cannot be located.    GI pathogen panel positive for norovirus, stool for C. difficile negative.  CRP less than 1, sed rate 2, cortisol a.m. 9.1, pancreatic elastase 190.  CT abdomen pelvis with contrast February 2024: Implanted stimulator device in the left anterior body wall, proximal extent of catheter is not visualized.  Implanted sacral stimulator device right posterior body wall.  Liver unremarkable.  Pancreas unremarkable.  No acute findings.  Colonoscopy March 2024: -Nonbleeding internal hemorrhoids  EGD March 2024 -Normal esophagus status post dilation -Normal-appearing stomach and duodenum  Wt Readings from Last 10 Encounters:  11/14/22 107 lb 9.6 oz (48.8 kg)  10/03/22 114 lb 12.8 oz (52.1 kg)  09/14/22 125 lb 3.5 oz (56.8 kg)  09/12/22 125 lb 3.5  oz (56.8 kg)  08/09/22 125 lb 3.2 oz (56.8 kg)  09/20/21 128 lb 6.4 oz (58.2 kg)  07/09/20 130 lb (59 kg)  08/27/19 131 lb 6.4 oz (59.6 kg)  08/13/18 142 lb 6.4 oz (64.6 kg)  02/27/18 135 lb (61.2 kg)    Medications   Current Outpatient Medications  Medication Sig Dispense Refill   ALPRAZolam (XANAX) 1 MG tablet Take 1.5 mg by mouth at bedtime.     carbidopa-levodopa (SINEMET IR) 25-100 MG tablet Take 1 tablet by mouth 3 (three) times daily.     citalopram (CELEXA) 20  MG tablet Take 20 mg by mouth daily.     dicyclomine (BENTYL) 10 MG capsule Take 1 capsule (10 mg total) by mouth 2 (two) times daily. 60 capsule 0   esomeprazole (NEXIUM) 40 MG capsule TAKE 1 CAPSULE DAILY BEFORE BREAKFAST 90 capsule 3   lipase/protease/amylase (CREON) 36000 UNITS CPEP capsule Take 2 capsules with meals and 1 capsule with snack 300 capsule 11   zonisamide (ZONEGRAN) 100 MG capsule Take 300 mg by mouth at bedtime.     No current facility-administered medications for this visit.    Allergies   Allergies as of 11/14/2022 - Review Complete 11/14/2022  Allergen Reaction Noted   Other Diarrhea and Swelling 05/18/2014   Tioconazole Itching and Swelling 12/16/2014   Monistat [miconazole] Swelling 11/28/2011   Tape Itching 10/04/2012   Silicone Itching and Rash 07/20/2022       Review of Systems   General: Negative for fever, chills, fatigue, weakness.  See HPI. ENT: Negative for hoarseness, difficulty swallowing , nasal congestion. CV: Negative for chest pain, angina, palpitations, dyspnea on exertion, peripheral edema.  Respiratory: Negative for dyspnea at rest, dyspnea on exertion, cough, sputum, wheezing.  GI: See history of present illness. GU:  Negative for dysuria, hematuria, urinary incontinence, urinary frequency, nocturnal urination.  Endo: Negative for unusual weight change.     Physical Exam   BP 101/66 (BP Location: Left Arm, Patient Position: Sitting, Cuff Size: Small)   Pulse 80   Temp 97.7 F (36.5 C) (Temporal)   Ht 5\' 3"  (1.6 m)   Wt 107 lb 9.6 oz (48.8 kg)   LMP 04/07/2016   SpO2 98%   BMI 19.06 kg/m    General: Well-nourished, well-developed in no acute distress.  Accompanied by cousin.  Patient appears anxious.  Fidgety. Eyes: No icterus. Mouth: Oropharyngeal mucosa moist and pink , no lesions erythema or exudate. Lungs: Clear to auscultation bilaterally.  Heart: Regular rate and rhythm, no murmurs rubs or gallops.  Abdomen: Bowel sounds  are normal, nondistended, no hepatosplenomegaly or masses,  no abdominal bruits or hernia , no rebound or guarding.  Mild diffuse tenderness Rectal: Not performed Extremities: No lower extremity edema. No clubbing or deformities. Neuro: Alert and oriented x 4   Skin: Warm and dry, no jaundice.   Psych: Alert and cooperative, normal mood and affect.  Labs   See hpi  Imaging Studies   No results found.  Assessment   Abdominal pain/diarrhea/weight loss: Extensive evaluation as outlined above.  Fecal elastase slightly low, patient was provided Creon, but not taking it appropriately.  Just started taking with meals 1 to 2 days ago.  She has a significant component of abdominal pain occurring postprandially.  She is lost 25 pounds over the past 4 to 5 months. Some of this likely due to caloric reduction due to pain related to meals. I am concerned that her symptoms are not all  explained by EPI (exocrine pancreatic insufficiency). She will continue Creon with meals to see if makes any different. But at this time would recommend ruling out mesenteric ischemia.   PLAN   Continue Creon 2 with meals and 1 with snacks.  Patient has been educated on the appropriate way to take pancreatic enzymes. Continue Bentyl 10 mg up to twice daily as needed for abdominal cramping. Continue esomeprazole 40 mg daily before breakfast. Complete labs prior to CT scan. CTA abdomen and pelvis this week.   Leanna Battles. Melvyn Neth, MHS, PA-C [facility]

## 2022-11-14 NOTE — Telephone Encounter (Signed)
Carelon PA for CTA A/P: Order ID: [id number]       Authorized  Approval Valid Through: 11/14/2022 - 12/13/2022

## 2022-11-14 NOTE — Telephone Encounter (Signed)
Pt informed of CTA appt date and time. Scheduled for 11/16/22, arrive at 3:15 pm, NPO 4 hours prior. Pt verbalized understanding.

## 2022-11-14 NOTE — ED Provider Notes (Signed)
[place] EMERGENCY DEPARTMENT AT [facility] Provider Note   CSN: [id number] Arrival date & time: 11/14/22  1730     History  Chief Complaint  Patient presents with   Kristina Lewis is a 57 y.o. female.  Patient reports she tripped over a stool and fell striking her head on the stool.  Patient is concerned that she has broken her nose.  Patient has a cut to her upper lip and she is concerned about her teeth.  Patient reports she has a knot on her forehead.  Patient denies any loss of consciousness.  She denies any nausea or vomiting.  Patient denies any visual change.   Fall       Home Medications Prior to Admission medications   Medication Sig Start Date End Date Taking? Authorizing Provider  ALPRAZolam Prudy Feeler) 1 MG tablet Take 1.5 mg by mouth at bedtime.    [provider]  carbidopa-levodopa (SINEMET IR) 25-100 MG tablet Take 1 tablet by mouth 3 (three) times daily. 05/05/21   [provider]  citalopram (CELEXA) 20 MG tablet Take 20 mg by mouth daily. 04/04/21   [provider]  dicyclomine (BENTYL) 10 MG capsule Take 1 capsule (10 mg total) by mouth 2 (two) times daily. 11/03/22   Corbin Ade, MD  esomeprazole (NEXIUM) 40 MG capsule TAKE 1 CAPSULE DAILY BEFORE BREAKFAST 12/30/19   Gelene Mink, NP  lipase/protease/amylase (CREON) 36000 UNITS CPEP capsule Take 2 capsules with meals and 1 capsule with snack 10/18/22   Rourk, Gerrit Friends, MD  zonisamide (ZONEGRAN) 100 MG capsule Take 300 mg by mouth at bedtime.    [provider]      Allergies    Other, Tioconazole, Monistat [miconazole], Tape, and Silicone    Review of Systems   Review of Systems  All other systems reviewed and are negative.   Physical Exam Updated Vital Signs BP 112/75 (BP Location: Right Arm)   Pulse 76   Temp 98.6 F (37 C) (Oral)   Resp 18   LMP 04/07/2016   SpO2 100%  Physical Exam Vitals and nursing note reviewed.  Constitutional:       Appearance: Normal appearance. She is well-developed.  HENT:     Head: Normocephalic.     Comments: Swelling mid forehead    Nose:     Comments: Tender upper nasal bone    Mouth/Throat:     Comments: Radiation mid upper lip no deep injury, teeth without injury Eyes:     Extraocular Movements: Extraocular movements intact.     Pupils: Pupils are equal, round, and reactive to light.  Cardiovascular:     Rate and Rhythm: Normal rate.  Pulmonary:     Effort: Pulmonary effort is normal.  Abdominal:     General: There is no distension.  Musculoskeletal:        General: Normal range of motion.     Cervical back: Normal range of motion.  Skin:    General: Skin is warm.  Neurological:     General: No focal deficit present.     Mental Status: She is alert and oriented to person, place, and time.     ED Results / Procedures / Treatments   Labs (all labs ordered are listed, but only abnormal results are displayed) Labs Reviewed - No data to display  EKG None  Radiology CT Head Wo Contrast  Result Date: 11/14/2022 CLINICAL DATA:  Head trauma, minor, normal  mental status (Age 57-64y); Polytrauma, blunt EXAM: CT HEAD WITHOUT CONTRAST CT MAXILLOFACIAL WITHOUT CONTRAST TECHNIQUE: Multidetector CT imaging of the head and maxillofacial structures were performed using the standard protocol without intravenous contrast. Multiplanar CT image reconstructions of the maxillofacial structures were also generated. RADIATION DOSE REDUCTION: This exam was performed according to the departmental dose-optimization program which includes automated exposure control, adjustment of the mA and/or kV according to patient size and/or use of iterative reconstruction technique. COMPARISON:  CT head 12/14/2021 FINDINGS: CT HEAD FINDINGS Brain: Imaging is slightly limited by metallic artifact. Bifrontal deep brain stimulators are unchanged with their tips in the expected location of the substantia nigra within the  cerebral peduncles bilaterally. No acute intracranial hemorrhage or infarct. No abnormal mass effect or midline shift. No abnormal intra or extra-axial mass lesion. Ventricular size is normal. Cerebellum is unremarkable. Vascular: No hyperdense vessel or unexpected calcification. Skull: No acute fracture Other: Mastoid air cells and middle ear cavities are clear. CT MAXILLOFACIAL FINDINGS Osseous: Remote healed fracture of the left nasal bone and nasal septum noted. No acute facial fracture. No mandibular dislocation. Orbits: Negative. No traumatic or inflammatory finding. Sinuses: Clear. Soft tissues: Negative. IMPRESSION: 1. No acute intracranial abnormality. No calvarial fracture. 2. No acute facial fracture. 3. Remote healed fracture of the left nasal bone and nasal septum. 4. Stable bilateral deep brain stimulators. Electronically Signed   By: Helyn Numbers M.D.   On: 11/14/2022 19:04   CT Maxillofacial Wo Contrast  Result Date: 11/14/2022 CLINICAL DATA:  Head trauma, minor, normal mental status (Age 25-64y); Polytrauma, blunt EXAM: CT HEAD WITHOUT CONTRAST CT MAXILLOFACIAL WITHOUT CONTRAST TECHNIQUE: Multidetector CT imaging of the head and maxillofacial structures were performed using the standard protocol without intravenous contrast. Multiplanar CT image reconstructions of the maxillofacial structures were also generated. RADIATION DOSE REDUCTION: This exam was performed according to the departmental dose-optimization program which includes automated exposure control, adjustment of the mA and/or kV according to patient size and/or use of iterative reconstruction technique. COMPARISON:  CT head 12/14/2021 FINDINGS: CT HEAD FINDINGS Brain: Imaging is slightly limited by metallic artifact. Bifrontal deep brain stimulators are unchanged with their tips in the expected location of the substantia nigra within the cerebral peduncles bilaterally. No acute intracranial hemorrhage or infarct. No abnormal mass  effect or midline shift. No abnormal intra or extra-axial mass lesion. Ventricular size is normal. Cerebellum is unremarkable. Vascular: No hyperdense vessel or unexpected calcification. Skull: No acute fracture Other: Mastoid air cells and middle ear cavities are clear. CT MAXILLOFACIAL FINDINGS Osseous: Remote healed fracture of the left nasal bone and nasal septum noted. No acute facial fracture. No mandibular dislocation. Orbits: Negative. No traumatic or inflammatory finding. Sinuses: Clear. Soft tissues: Negative. IMPRESSION: 1. No acute intracranial abnormality. No calvarial fracture. 2. No acute facial fracture. 3. Remote healed fracture of the left nasal bone and nasal septum. 4. Stable bilateral deep brain stimulators. Electronically Signed   By: Helyn Numbers M.D.   On: 11/14/2022 19:04    Procedures Procedures    Medications Ordered in ED Medications  ibuprofen (ADVIL) tablet 800 mg (800 mg Oral Given 11/14/22 1953)    ED Course/ Medical Decision Making/ A&P                             Medical Decision Making Patient reports she fell and struck her head and her face.  Patient reports she tripped over a stool  Amount and/or  Complexity of Data Reviewed Radiology: ordered and independent interpretation performed. Decision-making details documented in ED Course.    Details: CT head and CT maxillofacial also no evidence of head injury or facial bone/nasal fracture  Risk OTC drugs. Risk Details: Patient advised ice to the area of swelling.  She has an appointment to see her dentist tomorrow.  I do not see any evidence of dental injury.  It is advised to return if any problems           Final Clinical Impression(s) / ED Diagnoses Final diagnoses:  Contusion of face, initial encounter  Lip laceration, initial encounter  Injury of head, initial encounter    Rx / DC Orders ED Discharge Orders     None      An After Visit Summary was printed and given to the  patient.    Elson Areas, Cordelia Poche 11/14/22 2304    Cathren Laine, MD 11/15/22 [phone]

## 2022-11-14 NOTE — ED Triage Notes (Signed)
Pt had a trip and fall at work landing head first and on her nose/mouth into some stools. Went to her preferred provider for her worker's comp and was told she needed a head CT and also to see if her nose was fractured.  Pt has laceration to upper lip. No LOC.

## 2022-11-16 ENCOUNTER — Ambulatory Visit (HOSPITAL_COMMUNITY)
Admission: RE | Admit: 2022-11-16 | Discharge: 2022-11-16 | Disposition: A | Discharge status: Home / Self Care | Payer: BC Managed Care – PPO | Source: Ambulatory Visit | Attending: Gastroenterology | Admitting: Gastroenterology

## 2022-11-16 DIAGNOSIS — R197 Diarrhea, unspecified: Secondary | ICD-10-CM | POA: Insufficient documentation

## 2022-11-16 DIAGNOSIS — R1084 Generalized abdominal pain: Secondary | ICD-10-CM | POA: Diagnosis present

## 2022-11-16 DIAGNOSIS — R634 Abnormal weight loss: Secondary | ICD-10-CM | POA: Insufficient documentation

## 2022-11-16 MED ORDER — IOHEXOL 350 MG/ML SOLN
100.0000 mL | Freq: Once | INTRAVENOUS | Status: AC | PRN
  Administered 2022-11-16: 100 mL via INTRAVENOUS

## 2022-11-17 ENCOUNTER — Ambulatory Visit (HOSPITAL_COMMUNITY): Payer: BC Managed Care – PPO

## 2022-11-17 ENCOUNTER — Encounter (HOSPITAL_COMMUNITY): Payer: BC Managed Care – PPO | Admitting: Occupational Therapy

## 2022-11-17 DIAGNOSIS — G20B2 Parkinson's disease with dyskinesia, with fluctuations: Secondary | ICD-10-CM | POA: Diagnosis not present

## 2022-11-17 DIAGNOSIS — R278 Other lack of coordination: Secondary | ICD-10-CM

## 2022-11-17 DIAGNOSIS — R29818 Other symptoms and signs involving the nervous system: Secondary | ICD-10-CM

## 2022-11-17 DIAGNOSIS — R262 Difficulty in walking, not elsewhere classified: Secondary | ICD-10-CM

## 2022-11-17 LAB — COMPREHENSIVE METABOLIC PANEL
ALT: 8 IU/L (ref 0–32)
AST: 17 IU/L (ref 0–40)
Albumin/Globulin Ratio: 1.9 (ref 1.2–2.2)
Albumin: 4.4 g/dL (ref 3.8–4.9)
Alkaline Phosphatase: 112 IU/L (ref 44–121)
BUN/Creatinine Ratio: 33 — ABNORMAL HIGH (ref 9–23)
BUN: 30 mg/dL — ABNORMAL HIGH (ref 6–24)
Bilirubin Total: 0.2 mg/dL (ref 0.0–1.2)
CO2: 23 mmol/L (ref 20–29)
Calcium: 9.8 mg/dL (ref 8.7–10.2)
Chloride: 101 mmol/L (ref 96–106)
Creatinine, Ser: 0.92 mg/dL (ref 0.57–1.00)
Globulin, Total: 2.3 g/dL (ref 1.5–4.5)
Glucose: 82 mg/dL (ref 70–99)
Potassium: 4.2 mmol/L (ref 3.5–5.2)
Sodium: 140 mmol/L (ref 134–144)
Total Protein: 6.7 g/dL (ref 6.0–8.5)
eGFR: 74 mL/min/{1.73_m2} (ref >59)

## 2022-11-17 LAB — CBC WITH DIFFERENTIAL/PLATELET
Basophils Absolute: 0 10*3/uL (ref 0.0–0.2)
Basos: 1 %
EOS (ABSOLUTE): 0 10*3/uL (ref 0.0–0.4)
Eos: 0 %
Hematocrit: 42.2 % (ref 34.0–46.6)
Hemoglobin: 14.1 g/dL (ref 11.1–15.9)
Immature Grans (Abs): 0 10*3/uL (ref 0.0–0.1)
Immature Granulocytes: 0 %
Lymphocytes Absolute: 1.1 10*3/uL (ref 0.7–3.1)
Lymphs: 18 %
MCH: 32.5 pg (ref 26.6–33.0)
MCHC: 33.4 g/dL (ref 31.5–35.7)
MCV: 97 fL (ref 79–97)
Monocytes Absolute: 0.4 10*3/uL (ref 0.1–0.9)
Monocytes: 7 %
Neutrophils Absolute: 4.7 10*3/uL (ref 1.4–7.0)
Neutrophils: 74 %
Platelets: 260 10*3/uL (ref 150–450)
RBC: 4.34 x10E6/uL (ref 3.77–5.28)
RDW: 12.5 % (ref 11.7–15.4)
WBC: 6.3 10*3/uL (ref 3.4–10.8)

## 2022-11-17 LAB — LIPASE: Lipase: 57 U/L (ref 14–72)

## 2022-11-17 NOTE — Therapy (Signed)
OUTPATIENT PHYSICAL THERAPY NEURO TREATMENT   Patient Name: Kristina Lewis MRN: [id number] DOB:1965-11-20, 57 y.o., female Today's Date: 11/17/2022   PCP: Elfredia Nevins MD REFERRING PROVIDER: Corie Chiquito, MD  END OF SESSION:  PT End of Session - 11/17/22 0914     Visit Number 3    Number of Visits 6    Date for PT Re-Evaluation 12/07/22    Authorization Type [employer]    Authorization - Visit Number 2    Authorization - Number of Visits 6    Progress Note Due on Visit 6    PT Start Time 0907    PT Stop Time 0945    PT Time Calculation (min) 38 min    Equipment Utilized During Treatment Gait belt    Activity Tolerance Patient tolerated treatment well    Behavior During Therapy [facility] for tasks assessed/performed              Past Medical History:  Diagnosis Date   Anxiety    Atypical chest pain 03/15/2017   Bulging disc    CERVICAL   GERD (gastroesophageal reflux disease)    Hemorrhoids    IC (interstitial cystitis)    bladder stimulator 10/2012   Interstitial cystitis    Left leg pain    Migraines    Palpitations 03/15/2017   Parkinson's disease    deep brain stimulator placed   PONV (postoperative nausea and vomiting)    PVC (premature ventricular contraction) 04/04/2017   Seasonal allergies    Urge incontinence    Past Surgical History:  Procedure Laterality Date   BIOPSY N/A 10/27/2014   Procedure: BIOPSY;  Surgeon: Corbin Ade, MD;  Location: [facility];  Service: Endoscopy;  Laterality: N/A;  duodenal, esophageal   BREAST ENHANCEMENT SURGERY  AGE 15   CESAREAN SECTION  12/2000  &  01-26-2003   X2   COLONOSCOPY WITH PROPOFOL N/A 10/27/2014   Dr. Jena Gauss: Minimal internal heorrhoids;otherwise normal rectum, colon and terminal ileum.    COLONOSCOPY WITH PROPOFOL N/A 09/14/2022   Procedure: COLONOSCOPY WITH PROPOFOL;  Surgeon: Corbin Ade, MD;  Location: [facility];  Service: Endoscopy;  Laterality: N/A;  145pm, asa 3, Has deep brain  stimulator and urinary bladder stimulator   cranial neurostimulator implant  04/2018   for deep brain simulation. [facility]   CYSTO/ URETHRAL DILATION/ HOD  X2  LAST ONE 12-19-2004   ESOPHAGEAL DILATION N/A 10/27/2014   Procedure: ESOPHAGEAL DILATION;  Surgeon: Corbin Ade, MD;  Location: [facility];  Service: Endoscopy;  Laterality: N/AElease Hashimoto 53   ESOPHAGOGASTRODUODENOSCOPY (EGD) WITH PROPOFOL N/A 10/27/2014   Dr. Jena Gauss: Normal EGD. status post maloney dilation follwed by esophageal and duodenal biopsy. benign esophageal and duodenal biopsies   ESOPHAGOGASTRODUODENOSCOPY (EGD) WITH PROPOFOL N/A 09/14/2022   Procedure: ESOPHAGOGASTRODUODENOSCOPY (EGD) WITH PROPOFOL;  Surgeon: Corbin Ade, MD;  Location: [facility];  Service: Endoscopy;  Laterality: N/A;   HEMORRHOID BANDING  2016   Dr.Rourk   INTERSTIM IMPLANT PLACEMENT N/A 10/08/2012   Procedure: [name] IMPLANT FIRST STAGE;  Surgeon: Martina Sinner, MD;  Location: [facility];  Service: Urology;  Laterality: N/A;   INTERSTIM IMPLANT PLACEMENT N/A 10/08/2012   Procedure: [name] IMPLANT SECOND STAGE;  Surgeon: Martina Sinner, MD;  Location: [facility];  Service: Urology;  Laterality: N/A;   LASIK     bilateral   MALONEY DILATION N/A 09/14/2022   Procedure: MALONEY DILATION;  Surgeon: Jena Gauss,  Gerrit Friends, MD;  Location: [facility];  Service: Endoscopy;  Laterality: N/A;   TONSILLECTOMY  AGE 74'S   Patient Active Problem List   Diagnosis Date Noted   Diarrhea 08/09/2022   Dysphagia 08/27/2019   Family history of polyps in the colon 08/27/2019   Pneumoperitoneum 08/12/2019   Dyspepsia 08/13/2018   PVC (premature ventricular contraction) 04/04/2017   Palpitations 03/15/2017   Atypical chest pain 03/15/2017   Globus sensation 05/17/2016   Abdominal pain 05/17/2016   First degree hemorrhoids    Loss of weight 10/15/2014   GERD (gastroesophageal reflux disease) 10/15/2014   Dysphagia,  pharyngoesophageal phase 10/14/2014   Rectal bleeding 10/14/2014   External hemorrhoid 08/01/2012   Constipation 08/01/2012   Dysmenorrhea 02/15/2012   Leukorrhea 02/15/2012   Chronic headaches 02/15/2012   Perimenopausal vasomotor symptoms 11/28/2011   IC (interstitial cystitis) 08/17/2011    ONSET DATE: Jan of 2016  REFERRING DIAG: G20.A2 (ICD-10-CM) - Parkinson's disease without dyskinesia, with fluctuations  THERAPY DIAG:  Other lack of coordination  Parkinson's disease with dyskinesia, with fluctuations  Other symptoms and signs involving the nervous system  Difficulty in walking, not elsewhere classified  Rationale for Evaluation and Treatment: Rehabilitation  SUBJECTIVE:                                                                                                                                                                                             SUBJECTIVE STATEMENT: Patient had 2 more falls since last session; latest fall she fell and hit her head, lip; went to ER ; CT to rule out brain bleed; dentist is watching her 2 front teeth; sore but otherwise ok. Has not been able to do exercise much do to being busy and recent fall  Pt accompanied by: self  PERTINENT HISTORY: Parkinsons  PAIN:  Are you having pain? Yes: NPRS scale: 5/10 Pain location: stomach is hurting, fingers are tingling Pain description: cramping Aggravating factors: diary products Relieving factors: heat  PRECAUTIONS: Fall  WEIGHT BEARING RESTRICTIONS: No  FALLS: Has patient fallen in last 6 months? Yes. Number of falls lots of falls every day  LIVING ENVIRONMENT: Lives with: lives with their family Lives in: House/apartment Stairs: Yes: Internal: 10 steps; on right going up, on left going up, and can reach both and External: 5 steps; on right going up, on left going up, and can reach both Has following equipment at home: Single point cane  PLOF: Independent  PATIENT GOALS: not  to fall  OBJECTIVE:   DIAGNOSTIC FINDINGS:   COGNITION: Overall cognitive status: Within functional limits for tasks assessed   SENSATION:  Fingers are tingly  COORDINATION:   EDEMA:  none  POSTURE: No Significant postural limitations  LOWER EXTREMITY ROM:     Active  Right Eval Left Eval  Hip flexion    Hip extension    Hip abduction    Hip adduction    Hip internal rotation    Hip external rotation    Knee flexion    Knee extension    Ankle dorsiflexion    Ankle plantarflexion    Ankle inversion    Ankle eversion     (Blank rows = not tested)  LOWER EXTREMITY MMT:  bunion left foot; painful  MMT Right Eval Left Eval  Hip flexion 4+ 4+  Hip extension    Hip abduction    Hip adduction    Hip internal rotation    Hip external rotation    Knee flexion    Knee extension 4+ 4+  Ankle dorsiflexion 4+ 4  Ankle plantarflexion    Ankle inversion    Ankle eversion    (Blank rows = not tested)  BED MOBILITY:  Not tested  TRANSFERS: Assistive device utilized: none  Sit to stand: SBA Stand to sit: SBA Chair to chair: SBA Floor:  not tested  STAIRS: Level of Assistance: CGA Stair Negotiation Technique: Alternating Pattern  with Bilateral Rails Number of Stairs: 4 to 8  Height of Stairs: 4" to 8"  Comments: foot slap on stairs  GAIT: Gait pattern: decreased arm swing- Left, decreased stance time- Left, and decreased hip/knee flexion- Left Distance walked: 100 ft Assistive device utilized: None Level of assistance: CGA and Total A Comments: flat foot   FUNCTIONAL TESTS:  5 times sit to stand: 15.99 sec occassional use of Ue's to assist Dynamic Gait Index: 15/24  DGI 1. Gait level surface (2) Mild Impairment: Walks 20', uses assistive devices, slower speed, mild gait deviations. 2. Change in gait speed (2) Mild Impairment: Is able to change speed but demonstrates mild gait deviations, or not gait deviations but unable to achieve a significant  change in velocity, or uses an assistive device. 3. Gait with horizontal head turns (2) Mild Impairment: Performs head turns smoothly with slight change in gait velocity, i.e., minor disruption to smooth gait path or uses walking aid. 4. Gait with vertical head turns 1) Moderate Impairment: Performs head turns with moderate change in gait velocity, slows down, staggers but recovers, can continue to walk. 5. Gait and pivot turn (2) Mild Impairment: Pivot turns safely in > 3 seconds and stops with no loss of balance. 6. Step over obstacle (2) Mild Impairment: Is able to step over box, but must slow down and adjust steps to clear box safely. 7. Step around obstacles (2) Mild Impairment: Is able to step around both cones, but must slow down and adjust steps to clear cones. 8. Stairs (2) Mild Impairment: Alternating feet, must use rail.  TOTAL SCORE: 15 / 24   PATIENT SURVEYS:  LEFS 10/80 12.5%  TODAY'S TREATMENT:  DATE:  11/17/22 Sit to stand on blue foam 2 x 10 with red med ball  Stand on foam rhomberg stance head turns and nods x 5 each Stand on foam with eyes closed rhomberg stance x 20 sec Tandem stance on foam 2 x 30" each Walking over hurdles low and tall hurdles x 4 x 4 reps each Line walking down and back x 20 ft:  heel/toe walking x 3 reps; sidestepping x 1, backwards walking x 2 Step navigation 4" and 8" no railing    11/03/2022  -Sit/stands 3 x 10 with yellow Gr2000 w/ blue foam pad with cues for large anterior weight shift -Forward/backwards walking with unilateral DB carry in parallel bars 8lb DB 42ft x 4 -Lateral sidesteps in parallel bars with unilateral DB carry 5lb DB 83ft x 4 -81ft parallel bars ambulation with vertical head oscillations x 5   10/26/22 physical therapy evaluation and treatment    PATIENT EDUCATION: Education details: Patient  educated on exam findings, POC, scope of PT, HEP, and about local resources. Person educated: Patient Education method: Explanation, Demonstration, and Handouts Education comprehension: verbalized understanding, returned demonstration, verbal cues required, and tactile cues required   HOME EXERCISE PROGRAM: Access Code: [email or web address] URL: [email or web address] Date: 10/26/2022 Prepared by: [facility]  Exercises - Sit to Stand Without Arm Support  - 2 x daily - 7 x weekly - 1 sets - 10 reps - Standing Tandem Balance with Counter Support  - 2 x daily - 7 x weekly - 1 sets - 3 reps - 20 sec hold - Standing March with Counter Support  - 2 x daily - 7 x weekly - 1 sets - 10 reps  GOALS: Goals reviewed with patient? No  SHORT TERM GOALS: Target date: 11/16/2022  patient will be independent with initial HEP  Baseline: Goal status: INITIAL  2.  Patient will self report 30% improvement to improve tolerance for functional activity  Baseline:  Goal status: INITIAL   LONG TERM GOALS: Target date: 12/07/2022  Patient will be independent in self management strategies to improve quality of life and functional outcomes.  Baseline:  Goal status: INITIAL  2.  Patient will self report 50% improvement to improve tolerance for functional activity  Baseline:  Goal status: INITIAL  3.  Patient will increase her score on DGI by 4 points to demonstrate improved functional gait and balance Baseline: 15/24 Goal status: INITIAL  4.  Patient will improve 5 times sit to stand score from 15.99 sec sec to 12 sec to demonstrate improved functional mobility and increased lower extremity strength.  Baseline:  Goal status: INITIAL  5.  Patient will improve her score on LEFS by 20 points to demonstrate improved functional mobility Baseline: 10/80 Goal status: INITIAL  ASSESSMENT:  CLINICAL IMPRESSION: Today's session focused on dynamic and static balance as patient with 3 falls this  past week; 1 resulting in ED visit.  Discussed with patient at length purchase of a rollator for safety and gave her information on a local business with medical equipment and she verbalizes understanding.  Patient with max challenge with head nods with balance on foam today; starts with scissoring type gait as she fatigues with walking activity today.  Patient will benefit from skilled physical therapy services to address these deficits to improve level of function with ADLs, functional mobility tasks, and reduce risk for falls.    OBJECTIVE IMPAIRMENTS: Abnormal gait, decreased activity tolerance, decreased balance, decreased coordination, decreased endurance, decreased knowledge of use  of DME, decreased mobility, difficulty walking, decreased ROM, decreased strength, decreased safety awareness, hypomobility, increased fascial restrictions, impaired perceived functional ability, impaired flexibility, and impaired UE functional use.   ACTIVITY LIMITATIONS: carrying, lifting, bending, sitting, standing, squatting, sleeping, stairs, transfers, bed mobility, bathing, toileting, dressing, reach over head, locomotion level, and caring for others  PARTICIPATION LIMITATIONS: meal prep, cleaning, laundry, driving, shopping, community activity, occupation, and yard work  [facility] POTENTIAL: Good  CLINICAL DECISION MAKING: Evolving/moderate complexity  EVALUATION COMPLEXITY: Moderate  PLAN:  PT FREQUENCY: 1x/week  PT DURATION: 6 weeks  PLANNED INTERVENTIONS: Therapeutic exercises, Therapeutic activity, Neuromuscular re-education, Balance training, Gait training, Patient/Family education, Joint manipulation, Joint mobilization, Stair training, Orthotic/Fit training, DME instructions, Aquatic Therapy, Dry Needling, Electrical stimulation, Spinal manipulation, Spinal mobilization, Cryotherapy, Moist heat, Compression bandaging, scar mobilization, Splintting, Taping, Traction, Ultrasound, Ionotophoresis 4mg /ml  Dexamethasone, and Manual therapy   PLAN FOR NEXT SESSION: also having OT; lower extremity strength, gait and balance   9:48 AM, 11/17/22 Mckaylin Bastien Small Syrianna Schillaci MPT [facility] [place] [phone] Ph:[phone]

## 2022-11-20 ENCOUNTER — Other Ambulatory Visit: Payer: Self-pay | Admitting: *Deleted

## 2022-11-20 DIAGNOSIS — I871 Compression of vein: Secondary | ICD-10-CM

## 2022-11-20 DIAGNOSIS — I728 Aneurysm of other specified arteries: Secondary | ICD-10-CM

## 2022-11-20 NOTE — Addendum Note (Signed)
Addended by: Elinor Dodge on: 11/20/2022 08:16 AM   Modules accepted: Orders

## 2022-11-21 ENCOUNTER — Ambulatory Visit: Payer: BC Managed Care – PPO | Admitting: Internal Medicine

## 2022-11-22 ENCOUNTER — Encounter (HOSPITAL_COMMUNITY): Payer: BC Managed Care – PPO

## 2022-11-29 ENCOUNTER — Ambulatory Visit (HOSPITAL_COMMUNITY): Payer: BC Managed Care – PPO

## 2022-11-29 DIAGNOSIS — G20B2 Parkinson's disease with dyskinesia, with fluctuations: Secondary | ICD-10-CM | POA: Diagnosis not present

## 2022-11-29 DIAGNOSIS — R262 Difficulty in walking, not elsewhere classified: Secondary | ICD-10-CM

## 2022-11-29 DIAGNOSIS — R278 Other lack of coordination: Secondary | ICD-10-CM

## 2022-11-29 DIAGNOSIS — R29818 Other symptoms and signs involving the nervous system: Secondary | ICD-10-CM

## 2022-11-29 NOTE — Therapy (Signed)
OUTPATIENT PHYSICAL THERAPY NEURO TREATMENT   Patient Name: Kristina Lewis MRN: [id number] DOB:May 12, 1966, 57 y.o., female Today's Date: 11/29/2022   PCP: Elfredia Nevins MD REFERRING PROVIDER: Corie Chiquito, MD  END OF SESSION:  PT End of Session - 11/29/22 0903     Visit Number 4    Number of Visits 6    Date for PT Re-Evaluation 12/07/22    Authorization Type [employer]    Authorization - Visit Number 3    Authorization - Number of Visits 6    Progress Note Due on Visit 6    PT Start Time 0901    PT Stop Time [phone]    PT Time Calculation (min) 41 min    Equipment Utilized During Treatment Gait belt    Activity Tolerance Patient tolerated treatment well    Behavior During Therapy [facility] for tasks assessed/performed              Past Medical History:  Diagnosis Date   Anxiety    Atypical chest pain 03/15/2017   Bulging disc    CERVICAL   GERD (gastroesophageal reflux disease)    Hemorrhoids    IC (interstitial cystitis)    bladder stimulator 10/2012   Interstitial cystitis    Left leg pain    Migraines    Palpitations 03/15/2017   Parkinson's disease    deep brain stimulator placed   PONV (postoperative nausea and vomiting)    PVC (premature ventricular contraction) 04/04/2017   Seasonal allergies    Urge incontinence    Past Surgical History:  Procedure Laterality Date   BIOPSY N/A 10/27/2014   Procedure: BIOPSY;  Surgeon: Corbin Ade, MD;  Location: [facility];  Service: Endoscopy;  Laterality: N/A;  duodenal, esophageal   BREAST ENHANCEMENT SURGERY  AGE 79   CESAREAN SECTION  12/2000  &  01-26-2003   X2   COLONOSCOPY WITH PROPOFOL N/A 10/27/2014   Dr. Jena Gauss: Minimal internal heorrhoids;otherwise normal rectum, colon and terminal ileum.    COLONOSCOPY WITH PROPOFOL N/A 09/14/2022   Procedure: COLONOSCOPY WITH PROPOFOL;  Surgeon: Corbin Ade, MD;  Location: [facility];  Service: Endoscopy;  Laterality: N/A;  145pm, asa 3, Has deep brain  stimulator and urinary bladder stimulator   cranial neurostimulator implant  04/2018   for deep brain simulation. [facility]   CYSTO/ URETHRAL DILATION/ HOD  X2  LAST ONE 12-19-2004   ESOPHAGEAL DILATION N/A 10/27/2014   Procedure: ESOPHAGEAL DILATION;  Surgeon: Corbin Ade, MD;  Location: [facility];  Service: Endoscopy;  Laterality: N/AElease Hashimoto 53   ESOPHAGOGASTRODUODENOSCOPY (EGD) WITH PROPOFOL N/A 10/27/2014   Dr. Jena Gauss: Normal EGD. status post maloney dilation follwed by esophageal and duodenal biopsy. benign esophageal and duodenal biopsies   ESOPHAGOGASTRODUODENOSCOPY (EGD) WITH PROPOFOL N/A 09/14/2022   Procedure: ESOPHAGOGASTRODUODENOSCOPY (EGD) WITH PROPOFOL;  Surgeon: Corbin Ade, MD;  Location[facility];  Service: Endoscopy;  Laterality: N/A;   HEMORRHOID BANDING  2016   Dr.Rourk   INTERSTIM IMPLANT PLACEMENT N/A 10/08/2012   Procedure: [name] IMPLANT FIRST STAGE;  Surgeon: Martina Sinner, MD;  Location: [facility];  Service: Urology;  Laterality: N/A;   INTERSTIM IMPLANT PLACEMENT N/A 10/08/2012   Procedure: [name] IMPLANT SECOND STAGE;  Surgeon: Martina Sinner, MD;  Location: [facility];  Service: Urology;  Laterality: N/A;   LASIK     bilateral   MALONEY DILATION N/A 09/14/2022   Procedure: MALONEY DILATION;  Surgeon: Jena Gauss,  Gerrit Friends, MD;  Location: [facility];  Service: Endoscopy;  Laterality: N/A;   TONSILLECTOMY  AGE 8'S   Patient Active Problem List   Diagnosis Date Noted   Diarrhea 08/09/2022   Dysphagia 08/27/2019   Family history of polyps in the colon 08/27/2019   Pneumoperitoneum 08/12/2019   Dyspepsia 08/13/2018   PVC (premature ventricular contraction) 04/04/2017   Palpitations 03/15/2017   Atypical chest pain 03/15/2017   Globus sensation 05/17/2016   Abdominal pain 05/17/2016   First degree hemorrhoids    Loss of weight 10/15/2014   GERD (gastroesophageal reflux disease) 10/15/2014   Dysphagia,  pharyngoesophageal phase 10/14/2014   Rectal bleeding 10/14/2014   External hemorrhoid 08/01/2012   Constipation 08/01/2012   Dysmenorrhea 02/15/2012   Leukorrhea 02/15/2012   Chronic headaches 02/15/2012   Perimenopausal vasomotor symptoms 11/28/2011   IC (interstitial cystitis) 08/17/2011    ONSET DATE: Jan of 2016  REFERRING DIAG: G20.A2 (ICD-10-CM) - Parkinson's disease without dyskinesia, with fluctuations  THERAPY DIAG:  Other lack of coordination  Parkinson's disease with dyskinesia, with fluctuations  Other symptoms and signs involving the nervous system  Difficulty in walking, not elsewhere classified  Rationale for Evaluation and Treatment: Rehabilitation  SUBJECTIVE:                                                                                                                                                                                             SUBJECTIVE STATEMENT: Has had one more fall since last visit; was able to get up on her own; thinks she was just tired and had not eaten much.   still not eating much.  Has an appointment with vascular and vein MD next month to try to address abdominal pain ; thinks that she has a blockage causing some of her trouble.  Legs hurt today and " I have a headache"   Pt accompanied by: self  PERTINENT HISTORY: Parkinsons  PAIN:  Are you having pain? Yes: NPRS scale: 5/10 Pain location: stomach is hurting, fingers are tingling Pain description: cramping Aggravating factors: diary products Relieving factors: heat  PRECAUTIONS: Fall  WEIGHT BEARING RESTRICTIONS: No  FALLS: Has patient fallen in last 6 months? Yes. Number of falls lots of falls every day  LIVING ENVIRONMENT: Lives with: lives with their family Lives in: House/apartment Stairs: Yes: Internal: 10 steps; on right going up, on left going up, and can reach both and External: 5 steps; on right going up, on left going up, and can reach both Has following  equipment at home: Single point cane  PLOF: Independent  PATIENT GOALS: not to fall  OBJECTIVE:  DIAGNOSTIC FINDINGS:   COGNITION: Overall cognitive status: Within functional limits for tasks assessed   SENSATION: Fingers are tingly  COORDINATION:   EDEMA:  none  POSTURE: No Significant postural limitations  LOWER EXTREMITY ROM:     Active  Right Eval Left Eval  Hip flexion    Hip extension    Hip abduction    Hip adduction    Hip internal rotation    Hip external rotation    Knee flexion    Knee extension    Ankle dorsiflexion    Ankle plantarflexion    Ankle inversion    Ankle eversion     (Blank rows = not tested)  LOWER EXTREMITY MMT:  bunion left foot; painful  MMT Right Eval Left Eval  Hip flexion 4+ 4+  Hip extension    Hip abduction    Hip adduction    Hip internal rotation    Hip external rotation    Knee flexion    Knee extension 4+ 4+  Ankle dorsiflexion 4+ 4  Ankle plantarflexion    Ankle inversion    Ankle eversion    (Blank rows = not tested)  BED MOBILITY:  Not tested  TRANSFERS: Assistive device utilized: none  Sit to stand: SBA Stand to sit: SBA Chair to chair: SBA Floor:  not tested  STAIRS: Level of Assistance: CGA Stair Negotiation Technique: Alternating Pattern  with Bilateral Rails Number of Stairs: 4 to 8  Height of Stairs: 4" to 8"  Comments: foot slap on stairs  GAIT: Gait pattern: decreased arm swing- Left, decreased stance time- Left, and decreased hip/knee flexion- Left Distance walked: 100 ft Assistive device utilized: None Level of assistance: CGA and Total A Comments: flat foot   FUNCTIONAL TESTS:  5 times sit to stand: 15.99 sec occassional use of Ue's to assist Dynamic Gait Index: 15/24  DGI 1. Gait level surface (2) Mild Impairment: Walks 20', uses assistive devices, slower speed, mild gait deviations. 2. Change in gait speed (2) Mild Impairment: Is able to change speed but demonstrates  mild gait deviations, or not gait deviations but unable to achieve a significant change in velocity, or uses an assistive device. 3. Gait with horizontal head turns (2) Mild Impairment: Performs head turns smoothly with slight change in gait velocity, i.e., minor disruption to smooth gait path or uses walking aid. 4. Gait with vertical head turns 1) Moderate Impairment: Performs head turns with moderate change in gait velocity, slows down, staggers but recovers, can continue to walk. 5. Gait and pivot turn (2) Mild Impairment: Pivot turns safely in > 3 seconds and stops with no loss of balance. 6. Step over obstacle (2) Mild Impairment: Is able to step over box, but must slow down and adjust steps to clear box safely. 7. Step around obstacles (2) Mild Impairment: Is able to step around both cones, but must slow down and adjust steps to clear cones. 8. Stairs (2) Mild Impairment: Alternating feet, must use rail.  TOTAL SCORE: 15 / 24   PATIENT SURVEYS:  LEFS 10/80 12.5%  TODAY'S TREATMENT:  DATE:  11/29/22 Bike seat 7 x 3' level 2 conditioning  Rocker board F/B 2 x 20 reps each Hip vectors 2" hold x 8 each 8" box lunges 2 x 10 each Heel/toe raises x 20 Sit to stand on foam with yellow med ball x 10   11/17/22 Sit to stand on blue foam 2 x 10 with red med ball  Stand on foam rhomberg stance head turns and nods x 5 each Stand on foam with eyes closed rhomberg stance x 20 sec Tandem stance on foam 2 x 30" each Walking over hurdles low and tall hurdles x 4 x 4 reps each Line walking down and back x 20 ft:  heel/toe walking x 3 reps; sidestepping x 1, backwards walking x 2 Step navigation 4" and 8" no railing    11/03/2022  -Sit/stands 3 x 10 with yellow Gr2000 w/ blue foam pad with cues for large anterior weight shift -Forward/backwards walking with  unilateral DB carry in parallel bars 8lb DB 66ft x 4 -Lateral sidesteps in parallel bars with unilateral DB carry 5lb DB 57ft x 4 -74ft parallel bars ambulation with vertical head oscillations x 5   10/26/22 physical therapy evaluation and treatment    PATIENT EDUCATION: Education details: Patient educated on exam findings, POC, scope of PT, HEP, and about local resources. Person educated: Patient Education method: Explanation, Demonstration, and Handouts Education comprehension: verbalized understanding, returned demonstration, verbal cues required, and tactile cues required   HOME EXERCISE PROGRAM: Access Code: [email or web address] URL: [email or web address] Date: 10/26/2022 Prepared by: [facility]  Exercises - Sit to Stand Without Arm Support  - 2 x daily - 7 x weekly - 1 sets - 10 reps - Standing Tandem Balance with Counter Support  - 2 x daily - 7 x weekly - 1 sets - 3 reps - 20 sec hold - Standing March with Counter Support  - 2 x daily - 7 x weekly - 1 sets - 10 reps  GOALS: Goals reviewed with patient? No  SHORT TERM GOALS: Target date: 11/16/2022  patient will be independent with initial HEP  Baseline: Goal status: INITIAL  2.  Patient will self report 30% improvement to improve tolerance for functional activity  Baseline:  Goal status: INITIAL   LONG TERM GOALS: Target date: 12/07/2022  Patient will be independent in self management strategies to improve quality of life and functional outcomes.  Baseline:  Goal status: INITIAL  2.  Patient will self report 50% improvement to improve tolerance for functional activity  Baseline:  Goal status: INITIAL  3.  Patient will increase her score on DGI by 4 points to demonstrate improved functional gait and balance Baseline: 15/24 Goal status: INITIAL  4.  Patient will improve 5 times sit to stand score from 15.99 sec sec to 12 sec to demonstrate improved functional mobility and increased lower extremity  strength.  Baseline:  Goal status: INITIAL  5.  Patient will improve her score on LEFS by 20 points to demonstrate improved functional mobility Baseline: 10/80 Goal status: INITIAL  ASSESSMENT:  CLINICAL IMPRESSION: Patient arrives today with SPC.  Added rocker board for balance; patient tends to use a hip strategy versus ankle strategy to try to maintain balance on the rocker board. Tends to "slap" left foot with ambulation so worked on alternating lunges to encourage picking up and controlling left lower extremity with ambulation.  Patient will benefit from skilled physical therapy services to address these deficits to improve level of function with  ADLs, functional mobility tasks, and reduce risk for falls.    OBJECTIVE IMPAIRMENTS: Abnormal gait, decreased activity tolerance, decreased balance, decreased coordination, decreased endurance, decreased knowledge of use of DME, decreased mobility, difficulty walking, decreased ROM, decreased strength, decreased safety awareness, hypomobility, increased fascial restrictions, impaired perceived functional ability, impaired flexibility, and impaired UE functional use.   ACTIVITY LIMITATIONS: carrying, lifting, bending, sitting, standing, squatting, sleeping, stairs, transfers, bed mobility, bathing, toileting, dressing, reach over head, locomotion level, and caring for others  PARTICIPATION LIMITATIONS: meal prep, cleaning, laundry, driving, shopping, community activity, occupation, and yard work  [facility] POTENTIAL: Good  CLINICAL DECISION MAKING: Evolving/moderate complexity  EVALUATION COMPLEXITY: Moderate  PLAN:  PT FREQUENCY: 1x/week  PT DURATION: 6 weeks  PLANNED INTERVENTIONS: Therapeutic exercises, Therapeutic activity, Neuromuscular re-education, Balance training, Gait training, Patient/Family education, Joint manipulation, Joint mobilization, Stair training, Orthotic/Fit training, DME instructions, Aquatic Therapy, Dry Needling,  Electrical stimulation, Spinal manipulation, Spinal mobilization, Cryotherapy, Moist heat, Compression bandaging, scar mobilization, Splintting, Taping, Traction, Ultrasound, Ionotophoresis 4mg /ml Dexamethasone, and Manual therapy   PLAN FOR NEXT SESSION: also having OT; lower extremity strength, gait and balance   9:46 AM, 11/29/22 Juline Sanderford Small Skyelynn Rambeau MPT [facility] [place] [phone] Ph:[phone]

## 2022-12-12 ENCOUNTER — Encounter: Payer: Self-pay | Admitting: Internal Medicine

## 2022-12-27 ENCOUNTER — Ambulatory Visit: Payer: BC Managed Care – PPO | Admitting: Vascular Surgery

## 2022-12-27 ENCOUNTER — Encounter: Payer: Self-pay | Admitting: Vascular Surgery

## 2022-12-27 ENCOUNTER — Telehealth: Payer: Self-pay | Admitting: Gastroenterology

## 2022-12-27 VITALS — BP 100/66 | HR 89 | Temp 98.1°F | Ht 63.0 in | Wt 107.0 lb

## 2022-12-27 DIAGNOSIS — I728 Aneurysm of other specified arteries: Secondary | ICD-10-CM

## 2022-12-27 NOTE — Progress Notes (Signed)
[facility] [place]  Patient name: Kristina Lewis MRN: [id number] DOB: July 31, 1965 Sex: female  REASON FOR CONSULT: Discuss multiple findings on recent abdominal CT including splenic artery aneurysm  HPI: Kristina Lewis is a 57 y.o. female, who is here today for discussion of abnormal findings on CT scan.  She is here today with her cousin.  She has a very complex past history.  She has a history of parkinsonism and has both a deep brain stimulator implanted and also implantation for bladder stimulator.  Her most recent problem is difficulty with abdominal pain and difficulty eating.  She reports that she has lost 30 pounds.  She currently weighs 107 pounds.  She underwent recent CT scan and multiple incidental findings were noted.  She is here today for discussion of these.  She has no history of DVT.  She has no history of cardiac disease.  Past Medical History:  Diagnosis Date   Anxiety    Atypical chest pain 03/15/2017   Bulging disc    CERVICAL   GERD (gastroesophageal reflux disease)    Hemorrhoids    IC (interstitial cystitis)    bladder stimulator 10/2012   Interstitial cystitis    Left leg pain    Migraines    Palpitations 03/15/2017   Parkinson's disease    deep brain stimulator placed   PONV (postoperative nausea and vomiting)    PVC (premature ventricular contraction) 04/04/2017   Seasonal allergies    Urge incontinence     Family History  Problem Relation Age of Onset   Diabetes Mother        Died, 52   Colon polyps Mother        less than age 2   Diabetes Father        Living, 89   Cancer Father        prostate   Hypertension Father    Atrial fibrillation Father    CAD Father    Hypertension Maternal Grandmother    Heart disease Maternal Grandmother    Diabetes Paternal Grandfather    Breast cancer Maternal Aunt    Cancer Cousin        MATERNAL   Stroke Paternal Grandmother    Colon cancer Neg  Hx     SOCIAL HISTORY: Social History   Socioeconomic History   Marital status: Married    Spouse name: Not on file   Number of children: 3   Years of education: Not on file   Highest education level: Not on file  Occupational History   Not on file  Tobacco Use   Smoking status: Never   Smokeless tobacco: Never  Vaping Use   Vaping Use: Never used  Substance and Sexual Activity   Alcohol use: No    Alcohol/week: 0.0 standard drinks of alcohol   Drug use: No   Sexual activity: Not Currently    Birth control/protection: Pill  Other Topics Concern   Not on file  Social History Narrative   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  She had three healthy children at home.  Husband is Programmer, multimedia.     Social Determinants of Health   Financial Resource Strain: Not on file  Food Insecurity: Not on file  Transportation Needs: Not on file  Physical Activity: Not on file  Stress: Not on file  Social Connections: Not on file  Intimate Partner Violence: Not on file    Allergies  Allergen Reactions   Other Diarrhea and Swelling  celery and tomatoes   Tioconazole Itching and Swelling   Monistat [Miconazole] Swelling   Tape Itching    VERY IRRITATED Paper tape is Ok   Silicone Itching and Rash    Current Outpatient Medications  Medication Sig Dispense Refill   ALPRAZolam (XANAX) 1 MG tablet Take 1.5 mg by mouth at bedtime.     carbidopa-levodopa (SINEMET IR) 25-100 MG tablet Take 1 tablet by mouth 3 (three) times daily.     citalopram (CELEXA) 20 MG tablet Take 20 mg by mouth daily.     dicyclomine (BENTYL) 10 MG capsule Take 1 capsule (10 mg total) by mouth 2 (two) times daily. 60 capsule 0   esomeprazole (NEXIUM) 40 MG capsule TAKE 1 CAPSULE DAILY BEFORE BREAKFAST 90 capsule 3   lipase/protease/amylase (CREON) 36000 UNITS CPEP capsule Take 2 capsules with meals and 1 capsule with snack 300 capsule 11   zonisamide (ZONEGRAN) 100 MG capsule Take 300 mg by mouth at bedtime.     No current  facility-administered medications for this visit.    REVIEW OF SYSTEMS:  [X]  denotes positive finding, [ ]  denotes negative finding Cardiac  Comments:  Chest pain or chest pressure:    Shortness of breath upon exertion:    Short of breath when lying flat:    Irregular heart rhythm:        Vascular    Pain in calf, thigh, or hip brought on by ambulation:    Pain in feet at night that wakes you up from your sleep:     Blood clot in your veins:    Leg swelling:         Pulmonary    Oxygen at home:    Productive cough:     Wheezing:         Neurologic    Sudden weakness in arms or legs:     Sudden numbness in arms or legs:     Sudden onset of difficulty speaking or slurred speech:    Temporary loss of vision in one eye:     Problems with dizziness:         Gastrointestinal    Blood in stool:     Vomited blood:         Genitourinary    Burning when urinating:     Blood in urine:        Psychiatric    Major depression:         Hematologic    Bleeding problems:    Problems with blood clotting too easily:        Skin    Rashes or ulcers:        Constitutional    Fever or chills:      PHYSICAL EXAM: Vitals:   12/27/22 1442  BP: 100/66  Pulse: 89  Temp: 98.1 F (36.7 C)  SpO2: 99%  Weight: 107 lb (48.5 kg)  Height: 5\' 3"  (1.6 m)    GENERAL: The patient is a well-nourished female, in no acute distress. The vital signs are documented above. CARDIOVASCULAR: 2+ radial and 2+ dorsalis pedis pulses bilaterally.  No abdominal bruits.  No leg swelling. PULMONARY: There is good air exchange  MUSCULOSKELETAL: There are no major deformities or cyanosis. NEUROLOGIC: No focal weakness or paresthesias are detected. SKIN: There are no ulcers or rashes noted. PSYCHIATRIC: The patient has a normal affect.  DATA:  I reviewed the patient's CT images with the patient and her cousin present.  She has a  7 mm splenic artery aneurysm.  This appears to be at a bifurcation of the  artery with minimal dilatation present.  There is no change comparing CT dating back to 2021.  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  By my interpretation, she has a retroaortic left renal vein.  She may have an anterior component as well that may be compressed by the SMA.  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  She has an extremely small dilatation of her splenic artery and I explained that she has essentially no risk from this.  I explained that there would be no recommendation for treatment unless there was rapid expansion or enlargement greater than 2 to 2-1/2 cm.  She has documented no change over the last 3 years.  I would recommend CT scan in 2 years and follow-up at that time  I explained that she is having no symptoms and would be highly unlikely to have any difficulty related to outflow from her left kidney and that this is an anatomic variant with no clinical significance  Regarding her venous compression of her left iliac vein, I explained that she might have some slight increased risk for left leg DVT and explained what to look for.  If she has sudden swelling she should seek attention.  She reports that she does have some slight swelling in her left leg versus her right and this may be a result of this.  I would not recommend any specific treatment  She is frustrated obviously at inability to determine exact cause of her weight loss and GI difficulty.  She will continue to follow-up with Dr Jena Gauss regarding these issues.   Larina Earthly, MD FACS [facility] [phone] Pager [phone]  Note: Portions of this report may have been transcribed using voice recognition software.  Every effort has been made to ensure  accuracy; however, inadvertent computerized transcription errors may still be present.

## 2022-12-27 NOTE — Telephone Encounter (Signed)
Received information from vascular and vein specialist Dr. Arbie Cookey, findings on latest CTA not felt to explain her symptoms.   Please arrange for ov with Dr. Jena Gauss ONLY. I recommend she see him, may ultimately require tertiary care referral.

## 2022-12-28 NOTE — Telephone Encounter (Signed)
I LMOM for patient that we were moving her OV with [facility]SL on 7/10 to see RMR on 7/9 at 9am. Per LSL patient needs to see RMR only

## 2022-12-30 ENCOUNTER — Other Ambulatory Visit: Payer: Self-pay | Admitting: Internal Medicine

## 2023-01-01 NOTE — Telephone Encounter (Signed)
Pt called stating that she will try to keep that appt. Will call if unable to.

## 2023-01-01 NOTE — Telephone Encounter (Signed)
Noted  

## 2023-01-09 ENCOUNTER — Encounter: Payer: Self-pay | Admitting: Internal Medicine

## 2023-01-09 ENCOUNTER — Ambulatory Visit: Payer: BC Managed Care – PPO | Admitting: Internal Medicine

## 2023-01-09 VITALS — BP 95/61 | HR 83 | Temp 97.8°F | Ht 63.5 in | Wt 106.2 lb

## 2023-01-09 DIAGNOSIS — R634 Abnormal weight loss: Secondary | ICD-10-CM

## 2023-01-09 DIAGNOSIS — R109 Unspecified abdominal pain: Secondary | ICD-10-CM

## 2023-01-09 NOTE — Patient Instructions (Signed)
As discussed you have had an extensive GI workup finding a discrete cause for your symptoms.  You have evidence of pancreatic insufficiency.  Even though it has not helped, I recommend you continue taking pancreatic enzymes the other medications.  It is possible that some of your GI symptoms may be stress related.  I feel it would be a good idea for you to see someone for stress management and evaluate for potential depression.  As promised, I will call Dr. Sherwood Gambler to discuss.  He will likely have a resource that he uses.  I hope to be back in touch with you in the next 24 hours  Will plan to see you back here in the office in 2 months.

## 2023-01-09 NOTE — Progress Notes (Signed)
Primary Care Physician:  Kristina Nevins, MD Primary Gastroenterologist:  Dr. Jena Gauss  Pre-Procedure History & Physical: HPI:  Kristina Lewis is a 57 y.o. female here for follow-up of longstanding abdominal pain alternating constipation diarrhea, occasional vomiting, weight loss.  Comorbidities including Parkinson's requiring deep brain stimulation.  Extensively evaluated over the past year.  She has  undergone EGD with esophageal dilation as well as a colonoscopy.  CTA abdomen.  Inflammatory markers done all came back negative.  Pancreatic elastase low at 190 started on pancreatic enzyme supplements.  Patient is unable to tell me whether symptoms have improved; she continues to have intermittent constipation and diarrhea. CTA abdomen revealed abnormalities for which she saw Dr. Tawanna Cooler early.  However, there was no evidence of mesenteric stenosis and it was not felt that her weight loss or abdominal pain had anything to do with vascular abnormalities.  Today she weighs 106 pounds in February of this year which she weighed 125 pounds; in 2019 - 35 pounds.  She admits to feeling blue.  Sleeps poorly.  Feels she may be depressed.  Followed by neurologist at [facility]. Significant stressors in her life with recent divorce, etc. Has not seen Dr. Sherwood Gambler recently.  Overdue for GYN checkup.   Past Medical History:  Diagnosis Date   Anxiety    Atypical chest pain 03/15/2017   Bulging disc    CERVICAL   GERD (gastroesophageal reflux disease)    Hemorrhoids    IC (interstitial cystitis)    bladder stimulator 10/2012   Interstitial cystitis    Left leg pain    Migraines    Palpitations 03/15/2017   Parkinson's disease    deep brain stimulator placed   PONV (postoperative nausea and vomiting)    PVC (premature ventricular contraction) 04/04/2017   Seasonal allergies    Urge incontinence     Past Surgical History:  Procedure Laterality Date   BIOPSY N/A 10/27/2014   Procedure: BIOPSY;  Surgeon:  Corbin Ade, MD;  Location: [facility];  Service: Endoscopy;  Laterality: N/A;  duodenal, esophageal   BREAST ENHANCEMENT SURGERY  AGE 63   CESAREAN SECTION  12/2000  &  01-26-2003   X2   COLONOSCOPY WITH PROPOFOL N/A 10/27/2014   Dr. Jena Gauss: Minimal internal heorrhoids;otherwise normal rectum, colon and terminal ileum.    COLONOSCOPY WITH PROPOFOL N/A 09/14/2022   Procedure: COLONOSCOPY WITH PROPOFOL;  Surgeon: Corbin Ade, MD;  [facility];  Service: Endoscopy;  Laterality: N/A;  145pm, asa 3, Has deep brain stimulator and urinary bladder stimulator   cranial neurostimulator implant  04/2018   for deep brain simulation. [facility]   CYSTO/ URETHRAL DILATION/ HOD  X2  LAST ONE 12-19-2004   ESOPHAGEAL DILATION N/A 10/27/2014   Procedure: ESOPHAGEAL DILATION;  Surgeon: Corbin Ade, MD;  Location: [facility];  Service: Endoscopy;  Laterality: N/AElease [name]   ESOPHAGOGASTRODUODENOSCOPY (EGD) WITH PROPOFOL N/A 10/27/2014   Dr. Jena Gauss: Normal EGD. status post maloney dilation follwed by esophageal and duodenal biopsy. benign esophageal and duodenal biopsies   ESOPHAGOGASTRODUODENOSCOPY (EGD) WITH PROPOFOL N/A 09/14/2022   Procedure: ESOPHAGOGASTRODUODENOSCOPY (EGD) WITH PROPOFOL;  Surgeon: Corbin Ade, MD;  [facility];  Service: Endoscopy;  Laterality: N/A;   HEMORRHOID BANDING  2016   Dr.Valerye Kobus   INTERSTIM IMPLANT PLACEMENT N/A 10/08/2012   Procedure: [name] IMPLANT FIRST STAGE;  Surgeon: Martina Sinner, MD;  [facility];  Service: Urology;  Laterality: N/A;  INTERSTIM IMPLANT PLACEMENT N/A 10/08/2012   Procedure: [name] IMPLANT SECOND STAGE;  Surgeon: Martina Sinner, MD;  Location: [facility];  Service: Urology;  Laterality: N/A;   LASIK     bilateral   MALONEY DILATION N/A 09/14/2022   Procedure: Kristina Lewis DILATION;  Surgeon: Corbin Ade, MD;  [facility];  Service: Endoscopy;  Laterality: N/A;    TONSILLECTOMY  AGE 43'S    Prior to Admission medications   Medication Sig Start Date End Date Taking? Authorizing Provider  ALPRAZolam Prudy Feeler) 1 MG tablet Take 1.5 mg by mouth at bedtime.   Yes [provider]  Calcium Carbonate (CALCIUM 600 PO) Take by mouth.   Yes [provider]  carbidopa-levodopa (SINEMET IR) 25-100 MG tablet Take 1 tablet by mouth 3 (three) times daily. 05/05/21  Yes [provider]  citalopram (CELEXA) 20 MG tablet Take 20 mg by mouth daily. 04/04/21  Yes [provider]  dicyclomine (BENTYL) 10 MG capsule TAKE 1 CAPSULE BY MOUTH TWICE DAILY AS NEEDED FOR  CRAMPS  AND  LOOSE  STOOL 01/01/23  Yes Tiffany Kocher, PA-C  esomeprazole (NEXIUM) 40 MG capsule TAKE 1 CAPSULE DAILY BEFORE BREAKFAST 12/30/19  Yes Gelene Mink, NP  lipase/protease/amylase (CREON) 36000 UNITS CPEP capsule Take 2 capsules with meals and 1 capsule with snack 10/18/22  Yes Silvina Hackleman, Gerrit Friends, MD  zonisamide (ZONEGRAN) 100 MG capsule Take 300 mg by mouth at bedtime.   Yes [provider]    Allergies as of 01/09/2023 - Review Complete 01/09/2023  Allergen Reaction Noted   Other Diarrhea and Swelling 05/18/2014   Tioconazole Itching and Swelling 12/16/2014   Monistat [miconazole] Swelling 11/28/2011   Tape Itching 10/04/2012   Silicone Itching and Rash 07/20/2022    Family History  Problem Relation Age of Onset   Diabetes Mother        Died, 61   Colon polyps Mother        less than age Lewis   Diabetes Father        Living, 37   Cancer Father        prostate   Hypertension Father    Atrial fibrillation Father    CAD Father    Hypertension Maternal Grandmother    Heart disease Maternal Grandmother    Diabetes Paternal Grandfather    Breast cancer Maternal Aunt    Cancer Cousin        MATERNAL   Stroke Paternal Grandmother    Colon cancer Neg Hx     Social History   Socioeconomic History   Marital status: Divorced    Spouse name: Not on file    Number of children: 3   Years of education: Not on file   Highest education level: Not on file  Occupational History   Not on file  Tobacco Use   Smoking status: Never   Smokeless tobacco: Never  Vaping Use   Vaping Use: Never used  Substance and Sexual Activity   Alcohol use: No    Alcohol/week: 0.0 standard drinks of alcohol   Drug use: No   Sexual activity: Not Currently    Birth control/protection: Pill  Other Topics Concern   Not on file  Social History Narrative   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  She had three healthy children at home.  Husband is Programmer, multimedia.     Social Determinants of Health   Financial Resource Strain: Not on file  Food Insecurity: Not on file  Transportation  Needs: Not on file  Physical Activity: Not on file  Stress: Not on file  Social Connections: Not on file  Intimate Partner Violence: Not on file    Review of Systems: See HPI, otherwise negative ROS  Physical Exam: BP 95/61 (BP Location: Right Arm, Patient Position: Sitting, Cuff Size: Normal)   Pulse 83   Temp 97.8 F (36.6 C) (Oral)   Ht 5' 3.5" (1.613 m)   Wt 106 lb 3.2 oz (48.2 kg)   LMP 04/07/2016   SpO2 95%   BMI 18.Lewis kg/m  General:   Fidgety.  Anxious.  Appears in no acute distress.  Accompanied by her "cousin"  Lungs:  Clear throughout to auscultation.   No wheezes, crackles, or rhonchi. No acute distress. Heart:  Regular rate and rhythm; no murmurs, clicks, rubs,  or gallops. Abdomen: Nondistended.  Battery pack for her deep brain stimulator palpated under the skin left lower quadrant.  Abdomen is otherwise soft and nontender mass.    Impression/Plan: 57 year old lady with abdominal pain, weight loss alternating constipation diarrhea.  Objective evidence of pancreatic exocrine insufficiency.  Extensive evaluation for abdominal pain.  No evidence of mesenteric ischemia.  No intraluminal inflammatory process identified.  CT otherwise negative for intra-abdominal pathology.  She continues on  Creon and PPI at my recommendation.  Clinically no improvement with pancreatic enzyme supplementation.  Low fecal elastase may be an incidental finding.  Lengthy discussion ensued about the brain - gut axis.  Anything affecting one area can affect the other.  I entertained the possibility of stress/depression playing a role with her abdominal symptoms.  She states she is dealing with her stress very poorly and she feels that she needs to talk to a professional as she has not done so up until now.  I told her that she has had extensive evaluation for other underlying occult GI pathology.  Offered to send her to a tertiary center for further evaluation but this will likely if frustrating for her as it is unlikely this will be helpful.  Recommendations:  No change in medical regimen.  She should continue Creon and PPI therapy.  As agreed, I will speak to Dr. Sherwood Gambler to help [name] facilitate referral to Mental health professional.  I asked her to give me 24 hours and I will get back to her.  Patient should undergo an updated oncology evaluation.  Will plan to see her back in this office in 2 months.  Addendum: I called and spoke with Dr. Sherwood Gambler at length about Kristina Lewis after her visit this morning.  He is going to make contact with her and get her referred to mental health professional.  Stated he may change her Celexa to Cymbalta. He is going to reach out to the patient.  I called Kristina Lewis on her mobile.  It rolled to voicemail.  I left a detailed message regarding my conversation with Dr. Sherwood Gambler.    Notice: This dictation was prepared with Dragon dictation along with smaller phrase technology. Any transcriptional errors that result from this process are unintentional and may not be corrected upon review.

## 2023-01-10 ENCOUNTER — Ambulatory Visit: Payer: BC Managed Care – PPO | Admitting: Gastroenterology

## 2023-01-18 ENCOUNTER — Telehealth: Payer: Self-pay | Admitting: Internal Medicine

## 2023-01-18 NOTE — Telephone Encounter (Signed)
Noted.  Thank you for the followup.

## 2023-01-18 NOTE — Telephone Encounter (Signed)
Reviewing patient's workup thus far.  Look back at CTs where enlarged lymph nodes were noted.  I reviewed the last couple ofCTs with Dr. Marvell Fuller today.  He felt these lymph nodes seen were reactive and not indicative of any underlying lymphoproliferative / neoplastic disorder. He recommended no further evaluation.

## 2023-02-15 ENCOUNTER — Encounter: Payer: Self-pay | Admitting: Internal Medicine

## 2023-02-26 ENCOUNTER — Encounter: Payer: Self-pay | Admitting: Internal Medicine

## 2023-03-29 ENCOUNTER — Encounter: Payer: Self-pay | Admitting: Internal Medicine

## 2023-03-29 ENCOUNTER — Ambulatory Visit: Payer: BC Managed Care – PPO | Attending: Internal Medicine | Admitting: Internal Medicine

## 2023-03-29 VITALS — BP 100/60 | HR 74 | Ht 63.5 in | Wt 105.8 lb

## 2023-03-29 DIAGNOSIS — R42 Dizziness and giddiness: Secondary | ICD-10-CM

## 2023-03-29 DIAGNOSIS — G20A1 Parkinson's disease without dyskinesia, without mention of fluctuations: Secondary | ICD-10-CM

## 2023-03-29 DIAGNOSIS — Z9689 Presence of other specified functional implants: Secondary | ICD-10-CM | POA: Diagnosis not present

## 2023-03-29 DIAGNOSIS — I493 Ventricular premature depolarization: Secondary | ICD-10-CM | POA: Diagnosis not present

## 2023-03-29 MED ORDER — DILTIAZEM HCL 60 MG PO TABS: 60.0000 mg | ORAL_TABLET | Freq: Three times a day (TID) | ORAL | 3 refills | Status: DC | PRN

## 2023-03-29 NOTE — Patient Instructions (Addendum)
Medication Instructions:  Your physician has recommended you make the following change in your medication:   -Start Diltiazem 60 mg every 8 hours as needed for palpitations- start in 2 weeks after decreasing caffeine intake.   -Do not take Metoprolol   *If you need a refill on your cardiac medications before your next appointment, please call your pharmacy*   Lab Work: None If you have labs (blood work) drawn today and your tests are completely normal, you will receive your results only by: MyChart Message (if you have MyChart) OR A paper copy in the mail If you have any lab test that is abnormal or we need to change your treatment, we will call you to review the results.   Testing/Procedures: None   Follow-Up: At [facility], you and your health needs are our priority.  As part of our continuing mission to provide you with exceptional heart care, we have created designated Provider Care Teams.  These Care Teams include your primary Cardiologist (physician) and Advanced Practice Providers (APPs -  Physician Assistants and Nurse Practitioners) who all work together to provide you with the care you need, when you need it.  We recommend signing up for the patient portal called "MyChart".  Sign up information is provided on this After Visit Summary.  MyChart is used to connect with patients for Virtual Visits (Telemedicine).  Patients are able to view lab/test results, encounter notes, upcoming appointments, etc.  Non-urgent messages can be sent to your provider as well.   To learn more about what you can do with MyChart, go to [email or web address].    Your next appointment:   6 month(s)  Provider:   You may see Luane School, MD or one of the following Advanced Practice Providers on your designated Care Team:   Randall An, PA-C  Jacolyn Reedy, PA-C     Other Instructions Please cut back on your caffeine.

## 2023-03-29 NOTE — Progress Notes (Signed)
Cardiology Office Note  Date: 03/29/2023   ID: Kristina Lewis, DOB 1966-03-10, MRN [id number]  PCP:  Elfredia Nevins, MD  Cardiologist:  Chilton Si, MD Electrophysiologist:  None   History of Present Illness: Kristina Lewis is a 57 y.o. female known to have Parkinson disease s/p deep brain stimulator in place, bladder stimulator in place, history of PVCs was referred to cardiology for evaluation palpitations.  Patient was diagnosed with PVCs in the past.  Not on rate controlling agents.  She was prescribed metoprolol by her PCP for symptomatic relief of palpitations.  Ongoing palpitations for the last 1 year worsened in the last 6 months.  Occurs almost daily, especially at night, last for few minutes.  He also started to notice dizziness in the last 6 months coinciding with weight loss and mostly exertional.  No syncope, angina, DOE, orthopnea, PND.  Past Medical History:  Diagnosis Date   Anxiety    Atypical chest pain 03/15/2017   Bulging disc    CERVICAL   GERD (gastroesophageal reflux disease)    Hemorrhoids    IC (interstitial cystitis)    bladder stimulator 10/2012   Interstitial cystitis    Left leg pain    Migraines    Palpitations 03/15/2017   Parkinson's disease    deep brain stimulator placed   PONV (postoperative nausea and vomiting)    PVC (premature ventricular contraction) 04/04/2017   Seasonal allergies    Urge incontinence     Past Surgical History:  Procedure Laterality Date   BIOPSY N/A 10/27/2014   Procedure: BIOPSY;  Surgeon: Corbin Ade, MD;  Location: [facility];  Service: Endoscopy;  Laterality: N/A;  duodenal, esophageal   BREAST ENHANCEMENT SURGERY  AGE 51   CESAREAN SECTION  12/2000  &  01-26-2003   X2   COLONOSCOPY WITH PROPOFOL N/A 10/27/2014   Dr. Jena Gauss: Minimal internal heorrhoids;otherwise normal rectum, colon and terminal ileum.    COLONOSCOPY WITH PROPOFOL N/A 09/14/2022   Procedure: COLONOSCOPY WITH PROPOFOL;  Surgeon: Corbin Ade, MD;  [facility];  Service: Endoscopy;  Laterality: N/A;  145pm, asa 3, Has deep brain stimulator and urinary bladder stimulator   cranial neurostimulator implant  04/2018   for deep brain simulation. [facility]   CYSTO/ URETHRAL DILATION/ HOD  X2  LAST ONE 12-19-2004   ESOPHAGEAL DILATION N/A 10/27/2014   Procedure: ESOPHAGEAL DILATION;  Surgeon: Corbin Ade, MD;  Location: [facility];  Service: Endoscopy;  Laterality: N/AElease Hashimoto 52   ESOPHAGOGASTRODUODENOSCOPY (EGD) WITH PROPOFOL N/A 10/27/2014   Dr. Jena Gauss: Normal EGD. status post maloney dilation follwed by esophageal and duodenal biopsy. benign esophageal and duodenal biopsies   ESOPHAGOGASTRODUODENOSCOPY (EGD) WITH PROPOFOL N/A 09/14/2022   Procedure: ESOPHAGOGASTRODUODENOSCOPY (EGD) WITH PROPOFOL;  Surgeon: Corbin Ade, MD;  [facility];  Service: Endoscopy;  Laterality: N/A;   HEMORRHOID BANDING  2016   Dr.Rourk   INTERSTIM IMPLANT PLACEMENT N/A 10/08/2012   Procedure: [name] IMPLANT FIRST STAGE;  Surgeon: Martina Sinner, MD;  Location: [facility];  Service: Urology;  Laterality: N/A;   INTERSTIM IMPLANT PLACEMENT N/A 10/08/2012   Procedure: [name] IMPLANT SECOND STAGE;  Surgeon: Martina Sinner, MD;  Location: [facility];  Service: Urology;  Laterality: N/A;   LASIK     bilateral   MALONEY DILATION N/A 09/14/2022   Procedure: [name] DILATION;  Surgeon: Corbin Ade, MD;  [facility];  Service: Endoscopy;  Laterality:  N/A;   TONSILLECTOMY  AGE 30'S    Current Outpatient Medications  Medication Sig Dispense Refill   ALPRAZolam (XANAX) 1 MG tablet Take 1.5 mg by mouth at bedtime.     Calcium Carbonate (CALCIUM 600 PO) Take by mouth.     carbidopa-levodopa (SINEMET IR) 25-100 MG tablet Take 1 tablet by mouth 3 (three) times daily.     citalopram (CELEXA) 20 MG tablet Take 20 mg by mouth daily.     diltiazem (CARDIZEM) 60 MG tablet Take 1 tablet (60 mg  total) by mouth every 8 (eight) hours as needed. 270 tablet 3   esomeprazole (NEXIUM) 40 MG capsule TAKE 1 CAPSULE DAILY BEFORE BREAKFAST 90 capsule 3   lipase/protease/amylase (CREON) 36000 UNITS CPEP capsule Take 2 capsules with meals and 1 capsule with snack 300 capsule 11   zonisamide (ZONEGRAN) 100 MG capsule Take 300 mg by mouth at bedtime.     dicyclomine (BENTYL) 10 MG capsule TAKE 1 CAPSULE BY MOUTH TWICE DAILY AS NEEDED FOR  CRAMPS  AND  LOOSE  STOOL (Patient not taking: Reported on 03/29/2023) 60 capsule 5   No current facility-administered medications for this visit.   Allergies:  Other, Tioconazole, Monistat [miconazole], Tape, and Silicone   Social History: The patient  reports that she has never smoked. She has never used smokeless tobacco. She reports that she does not drink alcohol and does not use drugs.   Family History: The patient's family history includes Atrial fibrillation in her father; Breast cancer in her maternal aunt; CAD in her father; Cancer in her cousin and father; Colon polyps in her mother; Diabetes in her father, mother, and paternal grandfather; Heart disease in her maternal grandmother; Hypertension in her father and maternal grandmother; Stroke in her paternal grandmother.   ROS:  Please see the history of present illness. Otherwise, complete review of systems is positive for none.  All other systems are reviewed and negative.   Physical Exam: VS:  BP 100/60   Pulse 74   Ht 5' 3.5" (1.613 m)   Wt 105 lb 12.8 oz (48 kg)   LMP 04/07/2016   SpO2 99%   BMI 18.45 kg/m , BMI Body mass index is 18.45 kg/m.  Wt Readings from Last 3 Encounters:  03/29/23 105 lb 12.8 oz (48 kg)  01/09/23 106 lb 3.2 oz (48.2 kg)  12/27/22 107 lb (48.5 kg)    General: Patient appears comfortable at rest. HEENT: Conjunctiva and lids normal, oropharynx clear with moist mucosa. Neck: Supple, no elevated JVP or carotid bruits, no thyromegaly. Lungs: Clear to auscultation,  nonlabored breathing at rest. Cardiac: Regular rate and rhythm, no S3 or significant systolic murmur, no pericardial rub. Abdomen: Soft, nontender, no hepatomegaly, bowel sounds present, no guarding or rebound. Extremities: No pitting edema, distal pulses 2+. Skin: Warm and dry. Musculoskeletal: No kyphosis. Neuropsychiatric: Alert and oriented x3, affect grossly appropriate.  Recent Labwork: 10/10/2022: TSH 1.650 11/16/2022: ALT 8; AST 17; BUN 30; Creatinine, Ser 0.92; Hemoglobin 14.1; Platelets 260; Potassium 4.2; Sodium 140     Component Value Date/Time   CHOL 233 (H) 04/09/2017 1058   TRIG 68 04/09/2017 1058   HDL 121 04/09/2017 1058   CHOLHDL 1.9 04/09/2017 1058   CHOLHDL 2.0 08/17/2011 1020   VLDL 10 08/17/2011 1020   LDLCALC 98 04/09/2017 1058     Assessment and Plan:  Palpitations Dizziness History of PVCs Parkinson disease s/p DBS Bladder stimulator in place   -Chronic ongoing palpitations x 1 year,  worsened in the last 6 months. Occurs almost daily, lasting for few minutes and mostly at night.  Drinks 2 cups of coffee.  Instructed patient to cut back on the coffee and reevaluate her symptoms in the next 2 weeks.  If still no relief, start diltiazem 60 mg every 8 hours for as needed palpitations. Discontinue metoprolol due to ongoing severe fatigue.  cannot place event monitor due to concurrent deep brain stimulator and bladder submitted in place. Patient reported she does not know how to turn off the DBS and turn it back on.  Will defer any event monitor at this time.  Will treat medically.  She also reported having normal orthostatic vitals in her neurologist office, dizziness coincided with weight loss and this could be symptomatic PVCs as well.  Will monitor for now. -Follows up with neurology for Parkinson disease management.    Medication Adjustments/Labs and Tests Ordered: Current medicines are reviewed at length with the patient today.  Concerns regarding medicines  are outlined above.    Disposition:  Follow up 6 months  Signed, Everado Pillsbury Verne Spurr, MD, 03/29/2023 11:21 AM    [facility] [address], [place], [place] [address]

## 2023-04-03 ENCOUNTER — Encounter: Payer: Self-pay | Admitting: Internal Medicine

## 2023-04-03 ENCOUNTER — Ambulatory Visit: Payer: BC Managed Care – PPO | Admitting: Internal Medicine

## 2023-04-03 VITALS — BP 105/70 | HR 75 | Temp 98.0°F | Ht 63.5 in | Wt 106.0 lb

## 2023-04-03 DIAGNOSIS — R634 Abnormal weight loss: Secondary | ICD-10-CM | POA: Diagnosis not present

## 2023-04-03 DIAGNOSIS — R935 Abnormal findings on diagnostic imaging of other abdominal regions, including retroperitoneum: Secondary | ICD-10-CM

## 2023-04-03 DIAGNOSIS — R109 Unspecified abdominal pain: Secondary | ICD-10-CM | POA: Diagnosis not present

## 2023-04-03 NOTE — Progress Notes (Signed)
Primary Care Physician:  Elfredia Nevins, MD Primary Gastroenterologist:  Dr. Jena Gauss  Pre-Procedure History & Physical: HPI:  Kristina Lewis is a 57 y.o. female here for abdominal pain and weight loss.  Extensive evaluation as chronicled in the medical record.  Stress and anxiety felt to be a significant component.  Spoke with Dr. Sherwood Gambler at her last office visit. He saw her and switched her Celexa to Cymbalta.  Her weight is stable.  She is able to eat.  Continues to have intermittent abdominal pain.  She questions assessment of her mesenteric vascular status.  Would like a second opinion.  Previously she did not feel like pancreatic enzymes helped her very much.  However when she misses a dose.  She has diarrhea.  In retrospect, she feels pancreatic enzymes have helped significantly.  She has deep brain stimulation and a bladder stimulator in place for Parkinson's and interstitial cystitis.  Past Medical History:  Diagnosis Date   Anxiety    Atypical chest pain 03/15/2017   Bulging disc    CERVICAL   GERD (gastroesophageal reflux disease)    Hemorrhoids    IC (interstitial cystitis)    bladder stimulator 10/2012   Interstitial cystitis    Left leg pain    Migraines    Palpitations 03/15/2017   Parkinson's disease (HCC)    deep brain stimulator placed   PONV (postoperative nausea and vomiting)    PVC (premature ventricular contraction) 04/04/2017   Seasonal allergies    Urge incontinence     Past Surgical History:  Procedure Laterality Date   BIOPSY N/A 10/27/2014   Procedure: BIOPSY;  Surgeon: Corbin Ade, MD;  Location: [facility];  Service: Endoscopy;  Laterality: N/A;  duodenal, esophageal   BREAST ENHANCEMENT SURGERY  AGE 56   CESAREAN SECTION  12/2000  &  01-26-2003   X2   COLONOSCOPY WITH PROPOFOL N/A 10/27/2014   Dr. Jena Gauss: Minimal internal heorrhoids;otherwise normal rectum, colon and terminal ileum.    COLONOSCOPY WITH PROPOFOL N/A 09/14/2022   Procedure:  COLONOSCOPY WITH PROPOFOL;  Surgeon: Corbin Ade, MD;  [facility]: [facility];  Service: Endoscopy;  Laterality: N/A;  145pm, asa 3, Has deep brain stimulator and urinary bladder stimulator   cranial neurostimulator implant  04/2018   for deep brain simulation. [facility]   CYSTO/ URETHRAL DILATION/ HOD  X2  LAST ONE 12-19-2004   ESOPHAGEAL DILATION N/A 10/27/2014   Procedure: ESOPHAGEAL DILATION;  Surgeon: Corbin Ade, MD;  Location: [facility];  Service: Endoscopy;  Laterality: N/AElease Hashimoto 52   ESOPHAGOGASTRODUODENOSCOPY (EGD) WITH PROPOFOL N/A 10/27/2014   Dr. Jena Gauss: Normal EGD. status post maloney dilation follwed by esophageal and duodenal biopsy. benign esophageal and duodenal biopsies   ESOPHAGOGASTRODUODENOSCOPY (EGD) WITH PROPOFOL N/A 09/14/2022   Procedure: ESOPHAGOGASTRODUODENOSCOPY (EGD) WITH PROPOFOL;  Surgeon: Corbin Ade, MD;  [facility];  Service: Endoscopy;  Laterality: N/A;   HEMORRHOID BANDING  2016   Dr.Maclin Guerrette   INTERSTIM IMPLANT PLACEMENT N/A 10/08/2012   Procedure: [name] IMPLANT FIRST STAGE;  Surgeon: Martina Sinner, MD;  Location: [facility];  Service: Urology;  Laterality: N/A;   INTERSTIM IMPLANT PLACEMENT N/A 10/08/2012   Procedure: [name] IMPLANT SECOND STAGE;  Surgeon: Martina Sinner, MD;  Location: [facility];  Service: Urology;  Laterality: N/A;   LASIK     bilateral   MALONEY DILATION N/A 09/14/2022   Procedure: [name] DILATION;  Surgeon: Corbin Ade, MD;  [facility];  Service: Endoscopy;  Laterality: N/A;   TONSILLECTOMY  AGE 57'S    Prior to Admission medications   Medication Sig Start Date End Date Taking? Authorizing Provider  ALPRAZolam Prudy Feeler) 1 MG tablet Take 1.5 mg by mouth at bedtime.   Yes [provider]  Calcium Carbonate (CALCIUM 600 PO) Take by mouth.   Yes [provider]  carbidopa-levodopa (SINEMET IR) 25-100 MG tablet Take 1 tablet by mouth 3 (three)  times daily. 05/05/21  Yes [provider]  dicyclomine (BENTYL) 10 MG capsule TAKE 1 CAPSULE BY MOUTH TWICE DAILY AS NEEDED FOR  CRAMPS  AND  LOOSE  STOOL 01/01/23  Yes Tiffany Kocher, PA-C  diltiazem (CARDIZEM) 60 MG tablet Take 1 tablet (60 mg total) by mouth every 8 (eight) hours as needed. 03/29/23  Yes Mallipeddi, Vishnu P, MD  DULoxetine (CYMBALTA) 30 MG capsule Take 30 mg by mouth daily. 04/02/23  Yes [provider]  esomeprazole (NEXIUM) 40 MG capsule TAKE 1 CAPSULE DAILY BEFORE BREAKFAST 12/30/19  Yes Gelene Mink, NP  lipase/protease/amylase (CREON) 36000 UNITS CPEP capsule Take 2 capsules with meals and 1 capsule with snack 10/18/22  Yes Mills Mitton, Gerrit Friends, MD  zonisamide (ZONEGRAN) 100 MG capsule Take 300 mg by mouth at bedtime.   Yes [provider]    Allergies as of 04/03/2023 - Review Complete 04/03/2023  Allergen Reaction Noted   Other Diarrhea and Swelling 05/18/2014   Tioconazole Itching and Swelling 12/16/2014   Monistat [miconazole] Swelling 11/28/2011   Tape Itching 10/04/2012   Silicone Itching and Rash 07/20/2022    Family History  Problem Relation Age of Onset   Diabetes Mother        Died, 33   Colon polyps Mother        less than age 39   Diabetes Father        Living, 56   Cancer Father        prostate   Hypertension Father    Atrial fibrillation Father    CAD Father    Hypertension Maternal Grandmother    Heart disease Maternal Grandmother    Diabetes Paternal Grandfather    Breast cancer Maternal Aunt    Cancer Cousin        MATERNAL   Stroke Paternal Grandmother    Colon cancer Neg Hx     Social History   Socioeconomic History   Marital status: Divorced    Spouse name: Not on file   Number of children: 3   Years of education: Not on file   Highest education level: Not on file  Occupational History   Not on file  Tobacco Use   Smoking status: Never   Smokeless tobacco: Never  Vaping Use   Vaping status: Never  Used  Substance and Sexual Activity   Alcohol use: No    Alcohol/week: 0.0 standard drinks of alcohol   Drug use: No   Sexual activity: Not Currently    Birth control/protection: Pill  Other Topics Concern   Not on file  Social History Narrative   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  She had three healthy children at home.  Husband is Programmer, multimedia.     Social Determinants of Health   Financial Resource Strain: Not on file  Food Insecurity: Not on file  Transportation Needs: Not on file  Physical Activity: Not on file  Stress: Not on file  Social Connections: Unknown (11/13/2021)   Received from [facility]  Social Network    Social Network: Not on file  Intimate Partner Violence: Unknown (10/05/2021)   Received from [facility]   HITS    Physically Hurt: Not on file    Insult or Talk Down To: Not on file    Threaten Physical Harm: Not on file    Scream or Curse: Not on file    Review of Systems: See HPI, otherwise negative ROS  Physical Exam: BP 105/70 (BP Location: Left Arm, Patient Position: Sitting, Cuff Size: Normal)   Pulse 75   Temp 98 F (36.7 C) (Oral)   Ht 5' 3.5" (1.613 m)   Wt 106 lb (48.1 kg)   LMP 04/07/2016   SpO2 97%   BMI 18.48 kg/m  General:   Alert, pleasant cooperative upbeat appearing lady in no acute distress.   Abdomen: Non-distended, normal bowel sounds.  Soft and nontender without appreciable mass or hepatosplenomegaly.    Impression/Plan: Pleasant 57 year old lady with weight loss abdominal pain somewhat failure to thrive.  The setting of multiple comorbidities.  Working diagnosis of pancreatic exocrine insufficiency based on low fecal elastase.  She has responded to pancreatic enzyme supplementation.  Some nutcracker abnormalities involving mesenteric vasculature.  Seen by vascular surgeon - felt not to be significant.  Patient questions this assessment and would like a second opinion overall given her GI issues.  I feel this is a very  reasonable approach.  Recommendations: No change in her medical regimen at this time.  We will go ahead and pursue a second opinion evaluation in the GI clinic at [facility] in [place].  This is to specifically get their opinion regarding your abdominal pain and the vascular abnormality seen previously on CT  Once we have that appointment made we will be back in touch with you.  Plan to see you back in 6 months and as needed.       Notice: This dictation was prepared with Dragon dictation along with smaller phrase technology. Any transcriptional errors that result from this process are unintentional and may not be corrected upon review.

## 2023-04-03 NOTE — Patient Instructions (Signed)
It was good to see you again today!  As discussed, I do not recommend any change in your medication regimen at this time.  We will go ahead and pursue a second opinion evaluation in the GI clinic at [facility] in [place].  This is to specifically get their opinion regarding your abdominal pain and the vascular abnormality seen previously on CT  Once we have that appointment made we will be back in touch with you.  Plan to see you back in 6 months and as needed.

## 2023-04-09 ENCOUNTER — Other Ambulatory Visit: Payer: Self-pay | Admitting: *Deleted

## 2023-04-09 DIAGNOSIS — R109 Unspecified abdominal pain: Secondary | ICD-10-CM

## 2023-04-09 DIAGNOSIS — R1084 Generalized abdominal pain: Secondary | ICD-10-CM

## 2023-04-10 ENCOUNTER — Encounter: Payer: Self-pay | Admitting: Internal Medicine

## 2023-04-12 ENCOUNTER — Other Ambulatory Visit: Payer: Self-pay | Admitting: *Deleted

## 2023-04-12 DIAGNOSIS — R9389 Abnormal findings on diagnostic imaging of other specified body structures: Secondary | ICD-10-CM

## 2023-04-26 ENCOUNTER — Encounter: Payer: Self-pay | Admitting: Internal Medicine

## 2023-04-27 ENCOUNTER — Emergency Department (HOSPITAL_COMMUNITY): Payer: BC Managed Care – PPO

## 2023-04-27 ENCOUNTER — Emergency Department (HOSPITAL_COMMUNITY)
Admission: EM | Admit: 2023-04-27 | Discharge: 2023-04-27 | Disposition: A | Discharge status: Home / Self Care | Payer: BC Managed Care – PPO | Attending: Emergency Medicine | Admitting: Emergency Medicine

## 2023-04-27 ENCOUNTER — Encounter (HOSPITAL_COMMUNITY): Payer: Self-pay

## 2023-04-27 ENCOUNTER — Other Ambulatory Visit: Payer: Self-pay

## 2023-04-27 DIAGNOSIS — S0990XA Unspecified injury of head, initial encounter: Secondary | ICD-10-CM | POA: Diagnosis present

## 2023-04-27 DIAGNOSIS — G20C Parkinsonism, unspecified: Secondary | ICD-10-CM | POA: Diagnosis not present

## 2023-04-27 DIAGNOSIS — R296 Repeated falls: Secondary | ICD-10-CM | POA: Insufficient documentation

## 2023-04-27 DIAGNOSIS — R7309 Other abnormal glucose: Secondary | ICD-10-CM | POA: Diagnosis not present

## 2023-04-27 DIAGNOSIS — W19XXXA Unspecified fall, initial encounter: Secondary | ICD-10-CM | POA: Diagnosis not present

## 2023-04-27 LAB — CBC WITH DIFFERENTIAL/PLATELET
Abs Immature Granulocytes: 0.02 10*3/uL (ref 0.00–0.07)
Basophils Absolute: 0 10*3/uL (ref 0.0–0.1)
Basophils Relative: 1 %
Eosinophils Absolute: 0 10*3/uL (ref 0.0–0.5)
Eosinophils Relative: 0 %
HCT: 41.1 % (ref 36.0–46.0)
Hemoglobin: 12.9 g/dL (ref 12.0–15.0)
Immature Granulocytes: 0 %
Lymphocytes Relative: 29 %
Lymphs Abs: 1.9 10*3/uL (ref 0.7–4.0)
MCH: 31 pg (ref 26.0–34.0)
MCHC: 31.4 g/dL (ref 30.0–36.0)
MCV: 98.8 fL (ref 80.0–100.0)
Monocytes Absolute: 0.5 10*3/uL (ref 0.1–1.0)
Monocytes Relative: 7 %
Neutro Abs: 4.1 10*3/uL (ref 1.7–7.7)
Neutrophils Relative %: 63 %
Platelets: 290 10*3/uL (ref 150–400)
RBC: 4.16 MIL/uL (ref 3.87–5.11)
RDW: 13.2 % (ref 11.5–15.5)
WBC: 6.5 10*3/uL (ref 4.0–10.5)
nRBC: 0 % (ref 0.0–0.2)

## 2023-04-27 LAB — COMPREHENSIVE METABOLIC PANEL
ALT: 9 U/L (ref 0–44)
AST: 20 U/L (ref 15–41)
Albumin: 3.8 g/dL (ref 3.5–5.0)
Alkaline Phosphatase: 120 U/L (ref 38–126)
Anion gap: 9 (ref 5–15)
BUN: 13 mg/dL (ref 6–20)
CO2: 27 mmol/L (ref 22–32)
Calcium: 9.1 mg/dL (ref 8.9–10.3)
Chloride: 103 mmol/L (ref 98–111)
Creatinine, Ser: 0.8 mg/dL (ref 0.44–1.00)
GFR, Estimated: >60 mL/min (ref >60)
Glucose, Bld: 98 mg/dL (ref 70–99)
Potassium: 3.2 mmol/L — ABNORMAL LOW (ref 3.5–5.1)
Sodium: 139 mmol/L (ref 135–145)
Total Bilirubin: 0.3 mg/dL (ref 0.3–1.2)
Total Protein: 6.8 g/dL (ref 6.5–8.1)

## 2023-04-27 LAB — CBG MONITORING, ED: Glucose-Capillary: 105 mg/dL — ABNORMAL HIGH (ref 70–99)

## 2023-04-27 LAB — MAGNESIUM: Magnesium: 2.2 mg/dL (ref 1.7–2.4)

## 2023-04-27 LAB — CK: Total CK: 107 U/L (ref 38–234)

## 2023-04-27 MED ORDER — POTASSIUM CHLORIDE CRYS ER 20 MEQ PO TBCR
40.0000 meq | EXTENDED_RELEASE_TABLET | Freq: Once | ORAL | Status: AC
  Administered 2023-04-27: 40 meq via ORAL
  Filled 2023-04-27: qty 2

## 2023-04-27 MED ORDER — ACETAMINOPHEN 325 MG PO TABS
650.0000 mg | ORAL_TABLET | Freq: Once | ORAL | Status: AC
  Administered 2023-04-27: 650 mg via ORAL
  Filled 2023-04-27: qty 2

## 2023-04-27 NOTE — ED Provider Notes (Signed)
[place] EMERGENCY DEPARTMENT AT [facility] Provider Note   CSN: [id number] Arrival date & time: 04/27/23  1407     History  Chief Complaint  Patient presents with   Kristina Lewis is a 57 y.o. female with a past medical history of Parkinson's disease, GERD, migraine send today for evaluation after multiple falls.  Patient reports 3 falls today and 1 yesterday.  Patient has multiple falls in the last 1 month. She was not sure why she fell. She denies tripping.  She denies any chest pain, shortness of breath before or after the fall.  Denies any fever, cold/chills, nausea, vomiting, bowel change, urinary symptoms, blood in his stool or urine.  Patient is following up with a neurologist at [facility] for Parkinson's disease.  Next appointment is in April next year.   Fall    Past Medical History:  Diagnosis Date   Anxiety    Atypical chest pain 03/15/2017   Bulging disc    CERVICAL   GERD (gastroesophageal reflux disease)    Hemorrhoids    IC (interstitial cystitis)    bladder stimulator 10/2012   Interstitial cystitis    Left leg pain    Migraines    Palpitations 03/15/2017   Parkinson's disease (HCC)    deep brain stimulator placed   PONV (postoperative nausea and vomiting)    PVC (premature ventricular contraction) 04/04/2017   Seasonal allergies    Urge incontinence    Past Surgical History:  Procedure Laterality Date   BIOPSY N/A 10/27/2014   Procedure: BIOPSY;  Surgeon: Corbin Ade, MD;  Location: [facility];  Service: Endoscopy;  Laterality: N/A;  duodenal, esophageal   BREAST ENHANCEMENT SURGERY  AGE 39   CESAREAN SECTION  12/2000  &  01-26-2003   X2   COLONOSCOPY WITH PROPOFOL N/A 10/27/2014   Dr. Jena Gauss: Minimal internal heorrhoids;otherwise normal rectum, colon and terminal ileum.    COLONOSCOPY WITH PROPOFOL N/A 09/14/2022   Procedure: COLONOSCOPY WITH PROPOFOL;  Surgeon: Corbin Ade, MD;  [facility];  Service: Endoscopy;   Laterality: N/A;  145pm, asa 3, Has deep brain stimulator and urinary bladder stimulator   cranial neurostimulator implant  04/2018   for deep brain simulation. [facility]   CYSTO/ URETHRAL DILATION/ HOD  X2  LAST ONE 12-19-2004   ESOPHAGEAL DILATION N/A 10/27/2014   Procedure: ESOPHAGEAL DILATION;  Surgeon: Corbin Ade, MD;  Location: [facility];  Service: Endoscopy;  Laterality: N/AElease Lewis 52   ESOPHAGOGASTRODUODENOSCOPY (EGD) WITH PROPOFOL N/A 10/27/2014   Dr. Jena Gauss: Normal EGD. status post maloney dilation follwed by esophageal and duodenal biopsy. benign esophageal and duodenal biopsies   ESOPHAGOGASTRODUODENOSCOPY (EGD) WITH PROPOFOL N/A 09/14/2022   Procedure: ESOPHAGOGASTRODUODENOSCOPY (EGD) WITH PROPOFOL;  Surgeon: Corbin Ade, MD;  [facility];  Service: Endoscopy;  Laterality: N/A;   HEMORRHOID BANDING  2016   Dr.Rourk   INTERSTIM IMPLANT PLACEMENT N/A 10/08/2012   Procedure: [name] IMPLANT FIRST STAGE;  Surgeon: Martina Sinner, MD;  Location: [facility];  Service: Urology;  Laterality: N/A;   INTERSTIM IMPLANT PLACEMENT N/A 10/08/2012   Procedure: [name] IMPLANT SECOND STAGE;  Surgeon: Martina Sinner, MD;  Location: [facility];  Service: Urology;  Laterality: N/A;   LASIK     bilateral   MALONEY DILATION N/A 09/14/2022   Procedure: Kristina Lewis DILATION;  Surgeon: Corbin Ade, MD;  [facility];  Service: Endoscopy;  Laterality:  N/A;   TONSILLECTOMY  AGE 58'S     Home Medications Prior to Admission medications   Medication Sig Start Date End Date Taking? Authorizing Provider  ALPRAZolam Prudy Feeler) 1 MG tablet Take 1.5 mg by mouth at bedtime.    [provider]  Calcium Carbonate (CALCIUM 600 PO) Take by mouth.    [provider]  carbidopa-levodopa (SINEMET IR) 25-100 MG tablet Take 1 tablet by mouth 3 (three) times daily. 05/05/21   [provider]  dicyclomine (BENTYL) 10 MG capsule TAKE 1  CAPSULE BY MOUTH TWICE DAILY AS NEEDED FOR  CRAMPS  AND  LOOSE  STOOL 01/01/23   Tiffany Kocher, PA-C  diltiazem (CARDIZEM) 60 MG tablet Take 1 tablet (60 mg total) by mouth every 8 (eight) hours as needed. 03/29/23   Mallipeddi, Vishnu P, MD  DULoxetine (CYMBALTA) 30 MG capsule Take 30 mg by mouth daily. 04/02/23   [provider]  esomeprazole (NEXIUM) 40 MG capsule TAKE 1 CAPSULE DAILY BEFORE BREAKFAST 12/30/19   Gelene Mink, NP  lipase/protease/amylase (CREON) 36000 UNITS CPEP capsule Take 2 capsules with meals and 1 capsule with snack 10/18/22   Rourk, Gerrit Friends, MD  zonisamide (ZONEGRAN) 100 MG capsule Take 300 mg by mouth at bedtime.    [provider]      Allergies    Other, Tioconazole, Monistat [miconazole], Tape, and Silicone    Review of Systems   Review of Systems Negative except as per HPI.  Physical Exam Updated Vital Signs BP 109/72   Pulse 67   Temp 97.9 F (36.6 C) (Oral)   Resp 17   Ht 5\' 4"  (1.626 m)   Wt 47.6 kg   LMP 04/07/2016   SpO2 99%   BMI 18.02 kg/m  Physical Exam Vitals and nursing note reviewed.  Constitutional:      Appearance: Normal appearance.  HENT:     Head: Normocephalic and atraumatic.     Mouth/Throat:     Mouth: Mucous membranes are moist.  Eyes:     General: No scleral icterus. Cardiovascular:     Rate and Rhythm: Normal rate and regular rhythm.     Pulses: Normal pulses.     Heart sounds: Normal heart sounds.  Pulmonary:     Effort: Pulmonary effort is normal.     Breath sounds: Normal breath sounds.  Abdominal:     General: Abdomen is flat.     Palpations: Abdomen is soft.     Tenderness: There is no abdominal tenderness.  Musculoskeletal:        General: No deformity.  Skin:    General: Skin is warm.     Findings: No rash.  Neurological:     General: No focal deficit present.     Mental Status: She is alert.     Comments: Cranial nerves II through XII intact. Intact sensation to light touch in all 4  extremities. 5/5 strength in all 4 extremities. Intact finger-to-nose and heel-to-shin of all 4 extremities. No visual field cuts. No neglect noted. No aphasia noted.  Psychiatric:        Mood and Affect: Mood normal.     ED Results / Procedures / Treatments   Labs (all labs ordered are listed, but only abnormal results are displayed) Labs Reviewed - No data to display  EKG None  Radiology No results found.  Procedures Procedures    Medications Ordered in ED Medications - No data to display  ED Course/ Medical Decision  Making/ A&P                                 Medical Decision Making Amount and/or Complexity of Data Reviewed Labs: ordered. Radiology: ordered.  Risk OTC drugs. Prescription drug management.   This patient presents to the ED for evaluation after multiple falls., this involves an extensive number of treatment options, and is a complaint that carries with a high risk of complications and morbidity.  The differential diagnosis includes fracture, dislocation, head bleed, Parkinson disease.  This is not an exhaustive list.  Lab tests: I ordered and personally interpreted labs.  The pertinent results include: WBC unremarkable. Hbg unremarkable. Platelets unremarkable. Electrolytes significant for potassium 3.2. BUN, creatinine unremarkable.   Imaging studies: I ordered imaging studies, personally reviewed, interpreted imaging and agree with the radiologist's interpretations. The results include: Chest x-ray and CT showed acute mildly displaced fracture of the ninth and 10th ribs, no pneumothorax.  CT head, cervical spine are unremarkable.  Problem list/ ED course/ Critical interventions/ Medical management: HPI: See above Vital signs within normal range and stable throughout visit. Laboratory/imaging studies significant for: See above. On physical examination, patient is afebrile and appears in no acute distress.  There was no focal neurological deficit on  my examination.  Patient was able to ambulate in the room with no difficulties.  CT chest showed acute mildly displaced fracture of the ninth and 10th ribs.  No pneumothorax.  CT head and cervical spine unremarkable.  CBC with no leukocytosis or anemia.  CMP with no evidence of AKI.  I have low suspicions for CVA/ICH.  Patient is awake, alert, oriented and appears in no acute distress.  Advised patient to follow-up with neurology for further evaluation management.  Strict ED return precaution discussed.  Patient and family at bedside are agreeable to the plan. I have reviewed the patient home medicines and have made adjustments as needed.  Cardiac monitoring/EKG: The patient was maintained on a cardiac monitor.  I personally reviewed and interpreted the cardiac monitor which showed an underlying rhythm of: sinus rhythm.  Additional history obtained: External records from outside source obtained and reviewed including: Chart review including previous notes, labs, imaging.  Consultations obtained: I spoke to Dr. Selina Cooley neurology who recommended outpatient follow-up with neurology.  Since patient will not be able to see her neurologist at [facility] until April she recommended providing information for [facility] neurology.  Disposition Continued outpatient therapy. Follow-up with neurology recommended for reevaluation of symptoms. Treatment plan discussed with patient.  Pt acknowledged understanding was agreeable to the plan. Worrisome signs and symptoms were discussed with patient, and patient acknowledged understanding to return to the ED if they noticed these signs and symptoms. Patient was stable upon discharge.   This chart was dictated using voice recognition software.  Despite best efforts to proofread,  errors can occur which can change the documentation meaning.          Final Clinical Impression(s) / ED Diagnoses Final diagnoses:  Fall, initial encounter  Injury of head, initial encounter     Rx / DC Orders ED Discharge Orders     None         Jeanelle Malling, [place] 04/27/23 2008    Jacalyn Lefevre, MD 04/28/23 [phone]

## 2023-04-27 NOTE — ED Notes (Signed)
Pt provided with incentive spirometer and educated on its use. Pt was able to provide demonstration of knowledge to this nurse.

## 2023-04-27 NOTE — ED Triage Notes (Signed)
Larey Seat today Frequent falls, hx of Parkinson's Increased falls over the last 2 weeks  This morning pt fell from standing position. Pt hit head. Pt doesn't remember fall. Hit the back of her head on the floor.   Denies thinners Complains of HA, pt stated blurred vision is normal from herself.

## 2023-04-27 NOTE — Discharge Instructions (Addendum)
Please take tylenol/ibuprofen for pain. I recommend close follow-up with neurology for reevaluation.  Please do not hesitate to return to emergency department if worrisome signs symptoms we discussed become apparent.

## 2023-05-04 ENCOUNTER — Telehealth: Payer: Self-pay | Admitting: Internal Medicine

## 2023-05-04 NOTE — Telephone Encounter (Signed)
I had a VM from patient asking for her medical records to pick up. I have printed off from 2020 to present for her. They are in the sample drawer up front with a release of records form for her to sign. She did not state if this was for personal reasons or if she was transferring care. I will confirm with patient when she arrives.

## 2023-05-04 NOTE — Telephone Encounter (Signed)
noted 

## 2023-06-15 ENCOUNTER — Other Ambulatory Visit (HOSPITAL_COMMUNITY): Payer: Self-pay | Admitting: Family Medicine

## 2023-06-15 DIAGNOSIS — R296 Repeated falls: Secondary | ICD-10-CM

## 2023-06-25 ENCOUNTER — Ambulatory Visit (HOSPITAL_COMMUNITY)
Admission: RE | Admit: 2023-06-25 | Discharge: 2023-06-25 | Disposition: A | Discharge status: Home / Self Care | Payer: BC Managed Care – PPO | Source: Ambulatory Visit | Attending: Family Medicine | Admitting: Family Medicine

## 2023-06-25 DIAGNOSIS — R296 Repeated falls: Secondary | ICD-10-CM | POA: Insufficient documentation

## 2023-07-18 ENCOUNTER — Other Ambulatory Visit: Payer: Self-pay

## 2023-07-18 ENCOUNTER — Emergency Department (HOSPITAL_COMMUNITY): Payer: 59

## 2023-07-18 ENCOUNTER — Encounter (HOSPITAL_COMMUNITY): Payer: Self-pay

## 2023-07-18 ENCOUNTER — Emergency Department (HOSPITAL_COMMUNITY)
Admission: EM | Admit: 2023-07-18 | Discharge: 2023-07-18 | Disposition: A | Discharge status: Home / Self Care | Payer: 59 | Attending: Emergency Medicine | Admitting: Emergency Medicine

## 2023-07-18 DIAGNOSIS — S20212A Contusion of left front wall of thorax, initial encounter: Secondary | ICD-10-CM | POA: Insufficient documentation

## 2023-07-18 DIAGNOSIS — W01198A Fall on same level from slipping, tripping and stumbling with subsequent striking against other object, initial encounter: Secondary | ICD-10-CM | POA: Insufficient documentation

## 2023-07-18 DIAGNOSIS — G20C Parkinsonism, unspecified: Secondary | ICD-10-CM | POA: Insufficient documentation

## 2023-07-18 DIAGNOSIS — R0789 Other chest pain: Secondary | ICD-10-CM | POA: Diagnosis present

## 2023-07-18 MED ORDER — KETOROLAC TROMETHAMINE 15 MG/ML IJ SOLN
15.0000 mg | Freq: Once | INTRAMUSCULAR | Status: AC
  Administered 2023-07-18: 15 mg via INTRAMUSCULAR
  Filled 2023-07-18: qty 1

## 2023-07-18 MED ORDER — HYDROCODONE-ACETAMINOPHEN 5-325 MG PO TABS: 1.0000 | ORAL_TABLET | Freq: Three times a day (TID) | ORAL | 0 refills | Status: DC | PRN

## 2023-07-18 MED ORDER — ACETAMINOPHEN 500 MG PO TABS
1000.0000 mg | ORAL_TABLET | Freq: Once | ORAL | Status: AC
  Administered 2023-07-18: 1000 mg via ORAL
  Filled 2023-07-18: qty 2

## 2023-07-18 MED ORDER — LIDOCAINE 5 % EX PTCH
1.0000 | MEDICATED_PATCH | CUTANEOUS | Status: DC
  Administered 2023-07-18: 1 via TRANSDERMAL
  Filled 2023-07-18: qty 1

## 2023-07-18 MED ORDER — LIDOCAINE 5 % EX PTCH: 1.0000 | MEDICATED_PATCH | CUTANEOUS | 0 refills | Status: AC

## 2023-07-18 NOTE — Discharge Instructions (Addendum)
 It was a pleasure taking care of you today.  You were evaluated in the ER for left sided rib pain after fall.  Your results showed healing old rib fractures, no new rib fractures.   It is important to follow-up closely with your primary care doctor and/or any specialist as discussed.  Come back to the ER if you have any new or worsening symptoms.  You can take the Norco for more severe pain and ibuprofen  if the pain is less severe.   Use your incentive spirometer at home as discussed.  You were prescribed opiate pain medication. This can have many sided effect such as drowsiness, slowed breathing, and conspitation. Do not take it with other medications that cause drowsiness (such as your alprazolam), or drink alcohol while taking it. Take a stool softener over the counter while taking it. Do no drive while taking this either.

## 2023-07-18 NOTE — ED Triage Notes (Signed)
 Pt arrived from home via POV c/o left ribcage pain following a fall at home. Pt reports she has new onset Parkinson's and reports an unsteady gait at baseline.

## 2023-07-18 NOTE — ED Provider Notes (Signed)
[place] EMERGENCY DEPARTMENT AT [facility] Provider Note   CSN: [id number] Arrival date & time: 07/18/23  1106     History  Chief Complaint  Patient presents with   Kristina Lewis is a 58 y.o. female.  She has PMH of Parkinson disease.  She reports this causes her to have frequent falls.  She reports she fell last night getting into her shower.  She had previously had shower doors which has been converted to a shower curtain but she still has a portion of the metal frame.  She was trying to step into the shower, lost her balance and fell, striking her left lateral chest wall on the metal bar, she did not fall the ground, no head injury or LOC.  No blood thinners.  She states it feels similar to when she broke ribs in October after a fall with a similar mechanism.  She denies shortness of breath but states it does hurt to take a deep breath.   Fall       Home Medications Prior to Admission medications   Medication Sig Start Date End Date Taking? Authorizing Provider  HYDROcodone-acetaminophen (NORCO) 5-325 MG tablet Take 1 tablet by mouth every 8 (eight) hours as needed for moderate pain (pain score 4-6). 07/18/23  Yes Tinamarie Przybylski A, PA-C  lidocaine (LIDODERM) 5 % Place 1 patch onto the skin daily. Remove & Discard patch within 12 hours or as directed by MD 07/18/23  Yes Cristi Loron, Aarish Rockers A, PA-C  ALPRAZolam (XANAX) 1 MG tablet Take 1.5 mg by mouth at bedtime.    [provider]  Calcium Carbonate (CALCIUM 600 PO) Take by mouth.    [provider]  carbidopa-levodopa (SINEMET IR) 25-100 MG tablet Take 1 tablet by mouth 3 (three) times daily. 05/05/21   [provider]  dicyclomine (BENTYL) 10 MG capsule TAKE 1 CAPSULE BY MOUTH TWICE DAILY AS NEEDED FOR  CRAMPS  AND  LOOSE  STOOL 01/01/23   Tiffany Kocher, PA-C  diltiazem (CARDIZEM) 60 MG tablet Take 1 tablet (60 mg total) by mouth every 8 (eight) hours as needed. 03/29/23   Mallipeddi,  Vishnu P, MD  DULoxetine (CYMBALTA) 30 MG capsule Take 30 mg by mouth daily. 04/02/23   [provider]  esomeprazole (NEXIUM) 40 MG capsule TAKE 1 CAPSULE DAILY BEFORE BREAKFAST 12/30/19   Gelene Mink, NP  lipase/protease/amylase (CREON) 36000 UNITS CPEP capsule Take 2 capsules with meals and 1 capsule with snack 10/18/22   Rourk, Gerrit Friends, MD  zonisamide (ZONEGRAN) 100 MG capsule Take 300 mg by mouth at bedtime.    [provider]      Allergies    Other, Tioconazole, Monistat [miconazole], Tape, and Silicone    Review of Systems   Review of Systems  Physical Exam Updated Vital Signs BP 112/80 (BP Location: Right Arm)   Pulse 67   Temp 98.1 F (36.7 C) (Oral)   Resp 16   Ht 5\' 4"  (1.626 m)   Wt 48 kg   LMP 04/07/2016   SpO2 96%   BMI 18.16 kg/m  Physical Exam Vitals and nursing note reviewed.  Constitutional:      General: She is not in acute distress.    Appearance: She is well-developed.  HENT:     Head: Normocephalic and atraumatic.     Nose: Nose normal.     Mouth/Throat:     Mouth: Mucous membranes are moist.  Eyes:  Extraocular Movements: Extraocular movements intact.     Conjunctiva/sclera: Conjunctivae normal.     Pupils: Pupils are equal, round, and reactive to light.  Cardiovascular:     Rate and Rhythm: Normal rate and regular rhythm.     Heart sounds: No murmur heard. Pulmonary:     Effort: Pulmonary effort is normal. No respiratory distress.     Breath sounds: Normal breath sounds.  Chest:       Comments: Bruising noted to left lateral chest wall, tenderness here and just anterior to this.  No crepitus.  No deformity. Abdominal:     Palpations: Abdomen is soft.     Tenderness: There is no abdominal tenderness.  Musculoskeletal:        General: No swelling.     Cervical back: Neck supple.  Skin:    General: Skin is warm and dry.     Capillary Refill: Capillary refill takes less than 2 seconds.  Neurological:     General: No  focal deficit present.     Mental Status: She is alert and oriented to person, place, and time.  Psychiatric:        Mood and Affect: Mood normal.     ED Results / Procedures / Treatments   Labs (all labs ordered are listed, but only abnormal results are displayed) Labs Reviewed - No data to display  EKG None  Radiology DG Chest 2 View Result Date: 07/18/2023 CLINICAL DATA:  Left rib pain status post fall. EXAM: CHEST - 2 VIEW COMPARISON:  Chest radiograph dated April 27, 2023. FINDINGS: The heart size and mediastinal contours are within normal limits. Both lungs are clear. No pleural effusion or pneumothorax. Healing posterolateral left ninth and tenth rib fractures. No acute rib fracture identified. Spinal stimulator wiring extending along the left anterior chest wall. Similar small rounded radiopaque density adjacent to the left clavicle, compatible with BB. IMPRESSION: 1. No acute findings in the chest. No acute rib fracture identified. 2. Healing posterolateral left ninth and tenth rib fractures. Electronically Signed   By: Hart Robinsons M.D.   On: 07/18/2023 13:52    Procedures Procedures    Medications Ordered in ED Medications  lidocaine (LIDODERM) 5 % 1 patch (1 patch Transdermal Patch Applied 07/18/23 1419)  ketorolac (TORADOL) 15 MG/ML injection 15 mg (has no administration in time range)  acetaminophen (TYLENOL) tablet 1,000 mg (1,000 mg Oral Given 07/18/23 1418)    ED Course/ Medical Decision Making/ A&P                                 Medical Decision Making This patient presents to the ED for concern of left chest wall pain, this involves an extensive number of treatment options, and is a complaint that carries with it a high risk of complications and morbidity.  The differential diagnosis includes rib fracture, rib contusion, pneumothorax, pulmonary contusion, other   Co morbidities that complicate the patient evaluation  Consent disease   Additional  history obtained:  Additional history obtained from EMR External records from outside source obtained and reviewed including prior notes, prior imaging results    Imaging Studies ordered:  I ordered imaging studies including chest x-ray I independently visualized and interpreted imaging which showed healing posterior lateral left ribs, no acute displaced rib fractures, no pneumothorax I agree with the radiologist interpretation     Problem List / ED Course / Critical interventions / Medication management  Chest wall contusion-patient had a fall after losing her balance and hit her ribs off of the metal frame of shower doors in her bathroom.  No other injuries, she has got some bruising to the left lateral chest wall with no crepitus, she has no acutely displaced rib fractures, no pneumothorax or pulmonary contusion on her chest x-ray, she does have healing prior fractures from October open chest x-ray.  Discussed with patient she could still have nondisplaced rib fractures versus contusion.  Discussed that we will treat these the same way, we have provided with analgesics, she already has an incentive spirometer that she started using last night after this happened from her prior injury.  We discussed splinting with a pillow or hand when coughing or taking a deep breath.  Discussed pain control with over-the-counter ibuprofen, given Norco if pain is more severe to help prevent splinting and possible development of pneumonia.  Discussed importance of adequate analgesia, patient given strict return precautions.  We discussed not taking the Norco at the same time as her alprazolam as this could increase risk of unintentional overdose.  Patient instructed not to drive or drink alcohol while taking this. I ordered medication including Toradol and Lidoderm for pain  Reevaluation of the patient after these medicines showed that the patient improved I have reviewed the patients home medicines and have  made adjustments as needed       Amount and/or Complexity of Data Reviewed Radiology: ordered.  Risk OTC drugs. Prescription drug management.           Final Clinical Impression(s) / ED Diagnoses Final diagnoses:  Chest wall contusion, left, initial encounter    Rx / DC Orders ED Discharge Orders          Ordered    HYDROcodone-acetaminophen (NORCO) 5-325 MG tablet  Every 8 hours PRN        07/18/23 1420    lidocaine (LIDODERM) 5 %  Every 24 hours        07/18/23 1422              Ma Rings, PA-C 07/18/23 1423    Vanetta Mulders, MD 07/21/23 1525

## 2023-08-15 ENCOUNTER — Telehealth: Payer: Self-pay | Admitting: "Endocrinology

## 2023-08-15 ENCOUNTER — Ambulatory Visit: Payer: Self-pay | Admitting: "Endocrinology

## 2023-08-15 ENCOUNTER — Encounter: Payer: Self-pay | Admitting: "Endocrinology

## 2023-08-15 VITALS — BP 100/72 | HR 88 | Ht 63.5 in | Wt 122.4 lb

## 2023-08-15 DIAGNOSIS — M818 Other osteoporosis without current pathological fracture: Secondary | ICD-10-CM | POA: Diagnosis not present

## 2023-08-15 NOTE — Telephone Encounter (Signed)
Could you place the reclast order for pt, thanks!

## 2023-08-15 NOTE — Progress Notes (Signed)
Endocrinology Consult Note                                            08/15/2023, 2:20 PM   Subjective:    Patient ID: Kristina Lewis, female    DOB: Feb 19, 1966, PCP Elfredia Nevins, MD   Past Medical History:  Diagnosis Date   Anxiety    Atypical chest pain 03/15/2017   Bulging disc    CERVICAL   GERD (gastroesophageal reflux disease)    Hemorrhoids    IC (interstitial cystitis)    bladder stimulator 10/2012   Interstitial cystitis    Left leg pain    Migraines    Palpitations 03/15/2017   Parkinson's disease (HCC)    deep brain stimulator placed   PONV (postoperative nausea and vomiting)    PVC (premature ventricular contraction) 04/04/2017   Seasonal allergies    Urge incontinence    Past Surgical History:  Procedure Laterality Date   BIOPSY N/A 10/27/2014   Procedure: BIOPSY;  Surgeon: Corbin Ade, MD;  Location: [facility];  Service: Endoscopy;  Laterality: N/A;  duodenal, esophageal   BREAST ENHANCEMENT SURGERY  AGE 42   CESAREAN SECTION  12/2000  &  01-26-2003   X2   COLONOSCOPY WITH PROPOFOL N/A 10/27/2014   Dr. Jena Gauss: Minimal internal heorrhoids;otherwise normal rectum, colon and terminal ileum.    COLONOSCOPY WITH PROPOFOL N/A 09/14/2022   Procedure: COLONOSCOPY WITH PROPOFOL;  Surgeon: Corbin Ade, MD;  Location: [facility];  Service: Endoscopy;  Laterality: N/A;  145pm, asa 3, Has deep brain stimulator and urinary bladder stimulator   cranial neurostimulator implant  04/2018   for deep brain simulation. [facility]   CYSTO/ URETHRAL DILATION/ HOD  X2  LAST ONE 12-19-2004   ESOPHAGEAL DILATION N/A 10/27/2014   Procedure: ESOPHAGEAL DILATION;  Surgeon: Corbin Ade, MD;  Location: [facility];  Service: Endoscopy;  Laterality: N/AElease Lewis 53   ESOPHAGOGASTRODUODENOSCOPY (EGD) WITH PROPOFOL N/A 10/27/2014   Dr. Jena Gauss: Normal EGD. status post maloney dilation follwed by esophageal and duodenal biopsy. benign esophageal and duodenal biopsies    ESOPHAGOGASTRODUODENOSCOPY (EGD) WITH PROPOFOL N/A 09/14/2022   Procedure: ESOPHAGOGASTRODUODENOSCOPY (EGD) WITH PROPOFOL;  Surgeon: Corbin Ade, MD;  Location: [facility];  Service: Endoscopy;  Laterality: N/A;   HEMORRHOID BANDING  2016   Dr.Rourk   INTERSTIM IMPLANT PLACEMENT N/A 10/08/2012   Procedure: [name] IMPLANT FIRST STAGE;  Surgeon: Martina Sinner, MD;  Location: [facility];  Service: Urology;  Laterality: N/A;   INTERSTIM IMPLANT PLACEMENT N/A 10/08/2012   Procedure: [name] IMPLANT SECOND STAGE;  Surgeon: Martina Sinner, MD;  Location: [facility];  Service: Urology;  Laterality: N/A;   LASIK     bilateral   MALONEY DILATION N/A 09/14/2022   Procedure: Kristina Lewis DILATION;  Surgeon: Corbin Ade, MD;  Location: [facility];  Service: Endoscopy;  Laterality: N/A;   TONSILLECTOMY  AGE 34'S   Social History   Socioeconomic History   Marital status: Divorced    Spouse name: Not on file   Number of children: 3   Years of education: Not on file   Highest education level: Not on file  Occupational History   Not on file  Tobacco Use   Smoking status: Never    Passive exposure: Never  Smokeless tobacco: Never  Vaping Use   Vaping status: Never Used  Substance and Sexual Activity   Alcohol use: Yes    Comment: occasionally   Drug use: No   Sexual activity: Not Currently    Birth control/protection: Pill  Other Topics Concern   Not on file  Social History Narrative   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  She had three healthy children at home.  Husband is Programmer, multimedia.     Social Drivers of Corporate investment banker Strain: Not on file  Food Insecurity: Low Risk  (08/14/2023)   Received from [facility]   Hunger Vital Sign    Worried About Running Out of Food in the Last Year: Never true    Ran Out of Food in the Last Year: Never true  Transportation Needs: No Transportation Needs (08/14/2023)   Received from [facility]     In the past 12 months, has lack of reliable transportation kept you from medical appointments, meetings, work or from getting things needed for daily living? : No  Physical Activity: Not on file  Stress: Not on file  Social Connections: Unknown (11/13/2021)   [facility] from [facility]   Social Network    Social Network: Not on file   Family History  Problem Relation Age of Onset   Diabetes Mother        Died, 84   Colon polyps Mother        less than age 67   Diabetes Father        Living, 83   Cancer Father        prostate   Hypertension Father    Atrial fibrillation Father    CAD Father    Hypertension Maternal Grandmother    Heart disease Maternal Grandmother    Diabetes Paternal Grandfather    Breast cancer Maternal Aunt    Cancer Cousin        MATERNAL   Stroke Paternal Grandmother    Colon cancer Neg Hx    Outpatient Encounter Medications as of 08/15/2023  Medication Sig   BIOTIN PO Take 1 tablet by mouth daily.   Cholecalciferol (VITAMIN D3) 125 MCG (5000 UT) TABS Take 1 tablet by mouth daily.   TURMERIC CURCUMIN PO Take 1 tablet by mouth daily.   ALPRAZolam (XANAX) 1 MG tablet Take 1.5 mg by mouth at bedtime.   Calcium Carbonate (CALCIUM 600 PO) Take by mouth.   carbidopa-levodopa (SINEMET IR) 25-100 MG tablet Take 1 tablet by mouth 3 (three) times daily.   dicyclomine (BENTYL) 10 MG capsule TAKE 1 CAPSULE BY MOUTH TWICE DAILY AS NEEDED FOR  CRAMPS  AND  LOOSE  STOOL   diltiazem (CARDIZEM) 60 MG tablet Take 1 tablet (60 mg total) by mouth every 8 (eight) hours as needed.   DULoxetine (CYMBALTA) 30 MG capsule Take 30 mg by mouth daily.   esomeprazole (NEXIUM) 40 MG capsule TAKE 1 CAPSULE DAILY BEFORE BREAKFAST   HYDROcodone-acetaminophen (NORCO) 5-325 MG tablet Take 1 tablet by mouth every 8 (eight) hours as needed for moderate pain (pain score 4-6).   lidocaine (LIDODERM) 5 % Place 1 patch onto the skin daily. Remove & Discard patch within 12 hours or  as directed by MD   lipase/protease/amylase (CREON) 36000 UNITS CPEP capsule Take 2 capsules with meals and 1 capsule with snack   zonisamide (ZONEGRAN) 100 MG capsule Take 300 mg by mouth at bedtime.   No facility-administered encounter medications on file as  of 08/15/2023.   ALLERGIES: Allergies  Allergen Reactions   Other Diarrhea and Swelling    celery and tomatoes   Tioconazole Itching and Swelling   Monistat [Miconazole] Swelling   Tape Itching    VERY IRRITATED Paper tape is Ok   Silicone Itching and Rash    VACCINATION STATUS:  There is no immunization history on file for this patient.  HPI IEASHA BOEREMA is 58 y.o. female who presents today with a medical history as above. she is being seen in consultation for osteoporosis requested by Elfredia Nevins, MD.  Patient is accompanied by her cousin to clinic. Patient has a complicated and long medical history including Parkinson's disease.  She is on medications including Sinemet, Cymbalta, Creon, as well as hydrocodone. Patient has always been on lighter side of body build, denies any significant history of steroids exposure.  She did have rib fractures after a fall several months ago. With the family member's recommendation, she underwent screening bone density in December 2024 which revealed  finding of significant bone loss on the spine as well as hips. Patient denies any significant height loss, nor fragility fractures. She ambulates with a cane due to disequilibrium. She is not on any particular diet or exercise program. She went to menopause in her early 54s. Admittedly, it is less food especially less protein.  She was supposed to be on calcium and vitamin D supplement, not consistently taking.  Review of Systems  Constitutional: +mildly fluctuating body weight , current BMI of 21.34 kg/m, no fatigue, no subjective hyperthermia, no subjective hypothermia Eyes: no blurry vision, no xerophthalmia ENT: no sore throat,  no nodules palpated in throat, no dysphagia/odynophagia, no hoarseness Cardiovascular: no Chest Pain, no Shortness of Breath, no palpitations, no leg swelling Respiratory: no cough, no shortness of breath Gastrointestinal: no Nausea/Vomiting/Diarhhea Musculoskeletal: no muscle/joint aches, + disequilibrium Skin: no rashes Neurological: no tremors, no numbness, no tingling, no dizziness Psychiatric: no depression, no anxiety  Objective:       08/15/2023   10:04 AM 07/18/2023    2:39 PM 07/18/2023   11:33 AM  Vitals with BMI  Height 5' 3.5"  5\' 4"   Weight 122 lbs 6 oz  105 lbs 13 oz  BMI 21.34  18.16  Systolic 100 118   Diastolic 72 82   Pulse 88 70     BP 100/72   Pulse 88   Ht 5' 3.5" (1.613 m)   Wt 122 lb 6.4 oz (55.5 kg)   LMP 04/07/2016   BMI 21.34 kg/m   Wt Readings from Last 3 Encounters:  08/15/23 122 lb 6.4 oz (55.5 kg)  07/18/23 105 lb 13.1 oz (48 kg)  04/27/23 105 lb (47.6 kg)    Physical Exam  Constitutional:  Body mass index is 21.34 kg/m.,  not in acute distress, normal state of mind Eyes: PERRLA, EOMI, no exophthalmos ENT: moist mucous membranes, no gross thyromegaly, no gross cervical lymphadenopathy Cardiovascular: normal precordial activity, Regular Rate and Rhythm, no Murmur/Rubs/Gallops Respiratory:  adequate breathing efforts, no gross chest deformity, Clear to auscultation bilaterally Gastrointestinal: abdomen soft, Non -tender, No distension, Bowel Sounds present, no gross organomegaly Musculoskeletal: + Mild scoliosis towards the right side,  strength intact in all four extremities, no peripheral edema Skin: moist, warm, no rashes Neurological: no tremor with outstretched hands, Deep tendon reflexes normal in bilateral lower extremities.  CMP ( most recent) CMP     Component Value Date/Time   NA 139 04/27/2023 1546   NA  140 11/16/2022 1202   K 3.2 (L) 04/27/2023 1546   CL 103 04/27/2023 1546   CO2 27 04/27/2023 1546   GLUCOSE 98 04/27/2023  1546   BUN 13 04/27/2023 1546   BUN 30 (H) 11/16/2022 1202   CREATININE 0.80 04/27/2023 1546   CALCIUM 9.1 04/27/2023 1546   PROT 6.8 04/27/2023 1546   PROT 6.7 11/16/2022 1202   ALBUMIN 3.8 04/27/2023 1546   ALBUMIN 4.4 11/16/2022 1202   AST 20 04/27/2023 1546   ALT 9 04/27/2023 1546   ALKPHOS 120 04/27/2023 1546   BILITOT 0.3 04/27/2023 1546   BILITOT 0.2 11/16/2022 1202   EGFR 74 11/16/2022 1202   GFRNONAA >60 04/27/2023 1546     Diabetic Labs (most recent): Lab Results  Component Value Date   HGBA1C 5.7 03/18/2013     Lipid Panel ( most recent) Lipid Panel     Component Value Date/Time   CHOL 233 (H) 04/09/2017 1058   TRIG 68 04/09/2017 1058   HDL 121 04/09/2017 1058   CHOLHDL 1.9 04/09/2017 1058   CHOLHDL 2.0 08/17/2011 1020   VLDL 10 08/17/2011 1020   LDLCALC 98 04/09/2017 1058   LABVLDL 14 04/09/2017 1058      Lab Results  Component Value Date   TSH 1.650 10/10/2022   TSH 1.610 03/08/2017   TSH 4.078 11/28/2011   TSH 2.665 08/17/2011   FREET4 1.18 03/08/2017    DEXA scan from June 25, 2023  [facility] Spine L1-L4 06/25/2023 56.2 Osteoporosis -3.7 0.736 g/cm2 - -   DualFemur Neck Left 06/25/2023 56.2 Osteoporosis -3.2 0.591 g/cm2 - -   DualFemur Total Mean 06/25/2023 56.2 Osteoporosis -2.7 0.663 g/cm2 - -   Left Forearm Radius 33% 06/25/2023 56.2 Osteoporosis -2.5 0.533 g/cm2 - - ASSESSMENT: BMD as determined from [facility] Spine L1-L4 is 0.736 g/cm2 with a T-Score of -3.7. This patient is considered osteoporotic according to World Health Organization [facility]) criteria. The scan quality is good.   Assessment & Plan:   1. Other osteoporosis without current pathological fracture (Primary)  - Kristina Lewis  is being seen at a kind request of Elfredia Nevins, MD. - I have reviewed her available available records and clinically evaluated the patient. - Based on these reviews, she has severe osteoporosis.  Etiology seems to be multifactorial including  possible malabsorption as evidenced by the fact that she is on Creon.  She does not have significant steroid exposure, went to menopause in her 20s.  She does not have any significant history of fragility fracture. I had a long discussion with her about increased risk of fracture from osteoporosis and that she is an immediate candidate for treatment initiation.  She will not need workup for secondary causes at this time. I reviewed her treatment options including bisphosphonates as well as denosumab. She does not have contraindications at this time.  In light of his relative young age, she will be treated with potent bisphosphonates IV for 3 to 6 years, to be followed by Prolia every 6 months. She understands the risks and complications of untreated osteoporosis. Yearly Reclast infusion will be initiated when her prescription is ready at infusion center at [facility]. She will return in 6 months with labs including:  - Comprehensive metabolic panel - PTH, intact and calcium - Magnesium - Phosphorus - VITAMIN D 25 Hydroxy (Vit-D Deficiency, Fractures) - TSH - T4, free  She will be considered for repeat bone density in December 2026. -She is encouraged to restart her  calcium/vitamin D supplements.  She is also encouraged to pick up more protein from her diet including chicken, fish, yogurt as well as egg whites.  She is also increased to increase more plant-based protein to enhance her treatment response.   Fall precautions were discussed.  Patient understands treatment for osteoporosis and long-term engagement. - she is advised to maintain close follow up with Elfredia Nevins, MD for primary care needs.   -Thank you for involving me in the care of this pleasant patient.  Time spent with the patient: 46  minutes, of which >50% was spent in  counseling her about her osteoporosis and the rest in obtaining information about her symptoms, reviewing her previous labs/studies ( including  abstractions from other facilities), bone density studies, evaluations, and treatments,  and developing a plan to confirm diagnosis and long term treatment based on the latest standards of care/guidelines; and documenting her care.  Kristina Lewis participated in the discussions, expressed understanding, and voiced agreement with the above plans.  All questions were answered to her satisfaction. she is encouraged to contact clinic should she have any questions or concerns prior to her return visit.  Follow up plan: Return in about 6 months (around 02/12/2024) for F/U with Pre-visit Labs, Refer her for Reclast Infusion.   Marquis Lunch, MD [facility] [address] [place], [place] [address] Phone: [phone]  Fax: [phone]     08/15/2023, 2:20 PM  This note was partially dictated with voice recognition software. Similar sounding words can be transcribed inadequately or may not  be corrected upon review.

## 2023-08-16 NOTE — Telephone Encounter (Signed)
Order for Reclast faxed to Infusion Center.

## 2023-08-17 ENCOUNTER — Other Ambulatory Visit: Payer: Self-pay

## 2023-08-20 ENCOUNTER — Telehealth: Payer: Self-pay

## 2023-08-20 NOTE — Telephone Encounter (Signed)
Auth Submission: NO AUTH NEEDED Site of care: Site of care: [facility] Payer: [employer] Medication & CPT/J Code(s) submitted: Reclast (Zolendronic acid) [id number] Route of submission (phone, fax, portal): portal Phone # Fax # Auth type: Buy/Bill PB Units/visits requested: 5mg , 1 dose Reference number:  Approval from: 08/20/23 to 07/02/24

## 2023-09-04 ENCOUNTER — Encounter: Payer: Self-pay | Admitting: Internal Medicine

## 2023-09-05 ENCOUNTER — Other Ambulatory Visit: Payer: Self-pay

## 2023-09-05 ENCOUNTER — Encounter: Payer: Self-pay | Admitting: Emergency Medicine

## 2023-09-05 ENCOUNTER — Encounter: Payer: 59 | Attending: Internal Medicine | Admitting: *Deleted

## 2023-09-05 VITALS — BP 102/67 | HR 75 | Temp 98.2°F | Resp 14

## 2023-09-05 DIAGNOSIS — M818 Other osteoporosis without current pathological fracture: Secondary | ICD-10-CM

## 2023-09-05 MED ORDER — DIPHENHYDRAMINE HCL 25 MG PO CAPS
25.0000 mg | ORAL_CAPSULE | Freq: Once | ORAL | Status: AC
  Administered 2023-09-05: 25 mg via ORAL

## 2023-09-05 MED ORDER — ACETAMINOPHEN 325 MG PO TABS
650.0000 mg | ORAL_TABLET | Freq: Once | ORAL | Status: AC
  Administered 2023-09-05: 650 mg via ORAL

## 2023-09-05 MED ORDER — ZOLEDRONIC ACID 5 MG/100ML IV SOLN
5.0000 mg | Freq: Once | INTRAVENOUS | Status: AC
  Administered 2023-09-05: 5 mg via INTRAVENOUS

## 2023-09-05 MED ORDER — SODIUM CHLORIDE 0.9 % IV SOLN: INTRAVENOUS | Status: DC

## 2023-09-05 NOTE — Progress Notes (Signed)
 Diagnosis: Osteoporosis  Provider:  Danella Deis MD  Procedure: IV Infusion  IV Type: Peripheral, IV Location: L Antecubital  Reclast (Zolendronic Acid), Dose: 5 mg  Infusion Start Time: 1104  Infusion Stop Time: 1139  Post Infusion IV Care: Observation period completed  Discharge: Condition: Good, Destination: Home . AVS Provided  Performed by:  Daleen Squibb, RN

## 2023-09-05 NOTE — Progress Notes (Addendum)
      [facility] at [facility]    , [place]    Phone:  [phone]    September 05, 2023  Patient: Kristina Lewis  Date of Birth: [birth year]  Date of Visit: September 05, 2023    To Whom It May Concern:  [name] was seen and treated on September 05, 2023. Pt was given premedications that caused drowsiness and was unable to drive.            If you have any questions or concerns, please don't hesitate to call.   Sincerely,   Infusion Clinic Team  [phone]

## 2023-09-07 ENCOUNTER — Telehealth: Payer: Self-pay

## 2023-09-07 NOTE — Telephone Encounter (Signed)
 Pt called stating she received a Reclast infusion Wednesday. States she was given benadryl and slept afterwards. States when she woke up "I felt like I had the worst case of covid", aching all over, no strength, chest pain, aching in her arms, legs and back. States she has not been able to work and was able to finally take a shower today but was still difficult. Pt states she has a OOW note to cover Wednesday but needs a OOW note for Thursday and Today. States she called the infusion center but was advised to contact our office.

## 2023-09-07 NOTE — Telephone Encounter (Signed)
 Pt stated the Infusion Center told her she would have to get a note from [name].

## 2023-09-10 ENCOUNTER — Emergency Department (HOSPITAL_COMMUNITY)
Admission: EM | Admit: 2023-09-10 | Discharge: 2023-09-10 | Disposition: A | Discharge status: Home / Self Care | Attending: Emergency Medicine | Admitting: Emergency Medicine

## 2023-09-10 ENCOUNTER — Encounter (HOSPITAL_COMMUNITY): Payer: Self-pay

## 2023-09-10 ENCOUNTER — Other Ambulatory Visit: Payer: Self-pay

## 2023-09-10 DIAGNOSIS — S0990XA Unspecified injury of head, initial encounter: Secondary | ICD-10-CM | POA: Diagnosis present

## 2023-09-10 DIAGNOSIS — S0083XA Contusion of other part of head, initial encounter: Secondary | ICD-10-CM | POA: Diagnosis not present

## 2023-09-10 DIAGNOSIS — W01198A Fall on same level from slipping, tripping and stumbling with subsequent striking against other object, initial encounter: Secondary | ICD-10-CM | POA: Insufficient documentation

## 2023-09-10 DIAGNOSIS — W19XXXA Unspecified fall, initial encounter: Secondary | ICD-10-CM

## 2023-09-10 DIAGNOSIS — G20A1 Parkinson's disease without dyskinesia, without mention of fluctuations: Secondary | ICD-10-CM | POA: Insufficient documentation

## 2023-09-10 MED ORDER — ACETAMINOPHEN 500 MG PO TABS
1000.0000 mg | ORAL_TABLET | Freq: Once | ORAL | Status: AC
  Administered 2023-09-10: 1000 mg via ORAL
  Filled 2023-09-10: qty 2

## 2023-09-10 NOTE — Telephone Encounter (Signed)
 Returned pt's call to make her aware we could give her an out of work note only for the days we saw her here in the office and that the Infusion Center should have a protocol to give notes to patients per Dr.Nida. Pt proceeded to tell me she had just fallen and had hit her head prior to phone call. Pt coherent at time of call. I offered to call 911 for her, pt refused stating her cousin was on her way to her home where she had fallen. Advised pt to go to the ER. Pt voiced understanding.

## 2023-09-10 NOTE — ED Provider Notes (Signed)
 [place] EMERGENCY DEPARTMENT AT [facility] Provider Note   CSN: [id number] Arrival date & time: 09/10/23  1654     History  Chief Complaint  Patient presents with   Kristina Lewis is a 58 y.o. female with Parkinson's disease presents following mechanical fall.  Patient states with her Parkinson's she has difficulty walking sometimes.  States she backed up and fell and hit her head against the wall.  She did not lose consciousness.  She is not on blood thinners.  She endorses mild posterior headache otherwise no dizziness, blurry vision, worsening of gait, extremity weakness or numbness, nausea or vomiting.  Accompanied by her cousin reports she has been behaving at baseline without confusion or difficulty speaking. Fall Associated symptoms include headaches.   Past Medical History:  Diagnosis Date   Anxiety    Atypical chest pain 03/15/2017   Bulging disc    CERVICAL   GERD (gastroesophageal reflux disease)    Hemorrhoids    IC (interstitial cystitis)    bladder stimulator 10/2012   Interstitial cystitis    Left leg pain    Migraines    Palpitations 03/15/2017   Parkinson's disease (HCC)    deep brain stimulator placed   PONV (postoperative nausea and vomiting)    PVC (premature ventricular contraction) 04/04/2017   Seasonal allergies    Urge incontinence         Home Medications Prior to Admission medications   Medication Sig Start Date End Date Taking? Authorizing Provider  ALPRAZolam Prudy Feeler) 1 MG tablet Take 1.5 mg by mouth at bedtime.    [provider]  BIOTIN PO Take 1 tablet by mouth daily.    [provider]  Calcium Carbonate (CALCIUM 600 PO) Take by mouth.    [provider]  carbidopa-levodopa (SINEMET IR) 25-100 MG tablet Take 1 tablet by mouth 3 (three) times daily. 05/05/21   [provider]  Cholecalciferol (VITAMIN D3) 125 MCG (5000 UT) TABS Take 1 tablet by mouth daily.    [provider]  dicyclomine (BENTYL) 10 MG capsule TAKE 1 CAPSULE BY MOUTH TWICE DAILY AS NEEDED FOR  CRAMPS  AND  LOOSE  STOOL 01/01/23   Tiffany Kocher, PA-C  diltiazem (CARDIZEM) 60 MG tablet Take 1 tablet (60 mg total) by mouth every 8 (eight) hours as needed. 03/29/23   Mallipeddi, Vishnu P, MD  DULoxetine (CYMBALTA) 30 MG capsule Take 30 mg by mouth daily. 04/02/23   [provider]  esomeprazole (NEXIUM) 40 MG capsule TAKE 1 CAPSULE DAILY BEFORE BREAKFAST 12/30/19   Gelene Mink, NP  HYDROcodone-acetaminophen (NORCO) 5-325 MG tablet Take 1 tablet by mouth every 8 (eight) hours as needed for moderate pain (pain score 4-6). 07/18/23   Carmel Sacramento A, PA-C  lidocaine (LIDODERM) 5 % Place 1 patch onto the skin daily. Remove & Discard patch within 12 hours or as directed by MD 07/18/23   Ma Rings, PA-C  lipase/protease/amylase (CREON) 36000 UNITS CPEP capsule Take 2 capsules with meals and 1 capsule with snack 10/18/22   Rourk, Gerrit Friends, MD  TURMERIC CURCUMIN PO Take 1 tablet by mouth daily.    [provider]  zonisamide (ZONEGRAN) 100 MG capsule Take 300 mg by mouth at bedtime.    [provider]      Allergies    Other, Tioconazole, Monistat [miconazole], Tape, and Silicone    Review of Systems   Review of Systems  Neurological:  Positive for headaches.    Physical Exam Updated Vital Signs BP 121/80   Pulse 84   Temp 98.4 F (36.9 C) (Oral)   Resp 16   Ht 5' 3.5" (1.613 m)   Wt 55.5 kg   LMP 04/07/2016   SpO2 100%   BMI 21.33 kg/m  Physical Exam Vitals and nursing note reviewed.  Constitutional:      General: She is not in acute distress.    Appearance: She is well-developed.  HENT:     Head: No raccoon eyes or Battle's sign.     Comments: Occipital hematoma with mild overlying tenderness no wounds. Eyes:     Conjunctiva/sclera: Conjunctivae normal.  Neck:     Comments: No cervical tenderness, tolerates full range of motion without  discomfort Cardiovascular:     Rate and Rhythm: Normal rate and regular rhythm.     Heart sounds: No murmur heard. Pulmonary:     Effort: Pulmonary effort is normal. No respiratory distress.     Breath sounds: Normal breath sounds.  Abdominal:     Palpations: Abdomen is soft.     Tenderness: There is no abdominal tenderness.  Musculoskeletal:        General: No swelling.     Cervical back: Neck supple.  Skin:    General: Skin is warm and dry.     Capillary Refill: Capillary refill takes less than 2 seconds.  Neurological:     Mental Status: She is alert.     Comments: Patient is alert and oriented. There is no abnormal phonation. Symmetric smile without facial droop.  Moves all extremities spontaneously. 5/5 strength in upper and lower extremities. . No sensation deficit. There is no nystagmus. EOMI, PERRL. Coordination intact with finger to nose and normal ambulation.    Psychiatric:        Mood and Affect: Mood normal.     ED Results / Procedures / Treatments   Labs (all labs ordered are listed, but only abnormal results are displayed) Labs Reviewed - No data to display  EKG None  Radiology No results found.  Procedures Procedures    Medications Ordered in ED Medications  acetaminophen (TYLENOL) tablet 1,000 mg (has no administration in time range)    ED Course/ Medical Decision Making/ A&P                                 Medical Decision Making  This patient presents to the ED with chief complaint(s) of fall.  The complaint involves an extensive differential diagnosis and also carries with it a high risk of complications and morbidity.   pertinent past medical history as listed in HPI  The differential diagnosis includes  Intracranial hemorrhage, fracture, dislocation, seizure  Additional history obtained: Additional history obtained from family Records reviewed Care Everywhere/External Records  Initial Assessment:   Hemodynamically stable,  nontoxic-appearing patient presenting following mechanical fall with head injury.  On exam patient does have an occipital hematoma without open wound.  She has no neurodeficits on exam.  Is behaving at baseline.  There is no confusion reported nor nausea and vomiting.  She has no cervical midline tenderness, radicular symptoms and tolerates full range of motion of the cervical spine.  Negative [place] head and Nexus.  Discussed that I do not feel strongly that she warrants any imaging but given her age I would certainly offer it.  Patient declines interest and would like to  defer at this time. Independent ECG interpretation:  none  Independent labs interpretation:  The following labs were independently interpreted:  none  Independent visualization and interpretation of imaging: none  Treatment and Reassessment: Patient given Tylenol 1000 mg following first assessment for headache  Consultations obtained:   none  Disposition:   Patient will be discharged home with strict return precautions. The patient has been appropriately medically screened and/or stabilized in the ED. I have low suspicion for any other emergent medical condition which would require further screening, evaluation or treatment in the ED or require inpatient management. At time of discharge the patient is hemodynamically stable and in no acute distress. I have discussed work-up results and diagnosis with patient and answered all questions. Patient is agreeable with discharge plan. We discussed strict return precautions for returning to the emergency department and they verbalized understanding.     Social Determinants of Health:   none  This note was dictated with voice recognition software.  Despite best efforts at proofreading, errors may have occurred which can change the documentation meaning.          Final Clinical Impression(s) / ED Diagnoses Final diagnoses:  Fall, initial encounter    Rx / DC Orders ED  Discharge Orders     None         Fabienne Bruns 09/10/23 1931    Pricilla Loveless, MD 09/11/23 [phone]

## 2023-09-10 NOTE — ED Triage Notes (Signed)
 Pt arrived via POV c/o head injury from a fall. Pt reports she lost balance and fell backwards hitting her head on the ground. Pt denies LOC.

## 2023-09-10 NOTE — Discharge Instructions (Signed)
 You were evaluated in the emergency room following a fall.  You were noted to have a small hematoma on exam.  Otherwise no other significant findings.  As discussed please return to the emergency room if you experience any new or worsening symptoms including confusion, persistent vomiting, dizziness, worsening headache.  You may take Tylenol 1000 mg every 4-6 hours up to 3 times a day for pain.

## 2023-09-14 ENCOUNTER — Ambulatory Visit: Payer: BC Managed Care – PPO | Admitting: Internal Medicine

## 2023-09-21 ENCOUNTER — Ambulatory Visit: Payer: BC Managed Care – PPO | Attending: Internal Medicine | Admitting: Internal Medicine

## 2023-09-21 ENCOUNTER — Encounter: Payer: Self-pay | Admitting: Internal Medicine

## 2023-09-21 VITALS — BP 98/62 | HR 74 | Ht 63.5 in | Wt 118.2 lb

## 2023-09-21 DIAGNOSIS — I493 Ventricular premature depolarization: Secondary | ICD-10-CM | POA: Diagnosis not present

## 2023-09-21 MED ORDER — DILTIAZEM HCL 60 MG PO TABS: 60.0000 mg | ORAL_TABLET | Freq: Two times a day (BID) | ORAL | 3 refills | Status: AC | PRN

## 2023-09-21 NOTE — Patient Instructions (Signed)
 Medication Instructions:  Your physician has recommended you make the following change in your medication:   -Change Diltiazem to 60 every 12 hours.    *If you need a refill on your cardiac medications before your next appointment, please call your pharmacy*   Lab Work: None If you have labs (blood work) drawn today and your tests are completely normal, you will receive your results only by: MyChart Message (if you have MyChart) OR A paper copy in the mail If you have any lab test that is abnormal or we need to change your treatment, we will call you to review the results.   Testing/Procedures: None   Follow-Up: At [facility], you and your health needs are our priority.  As part of our continuing mission to provide you with exceptional heart care, we have created designated Provider Care Teams.  These Care Teams include your primary Cardiologist (physician) and Advanced Practice Providers (APPs -  Physician Assistants and Nurse Practitioners) who all work together to provide you with the care you need, when you need it.  We recommend signing up for the patient portal called "MyChart".  Sign up information is provided on this After Visit Summary.  MyChart is used to connect with patients for Virtual Visits (Telemedicine).  Patients are able to view lab/test results, encounter notes, upcoming appointments, etc.  Non-urgent messages can be sent to your provider as well.   To learn more about what you can do with MyChart, go to [email or web address].    Your next appointment:    Follow up as needed  Provider:   You may see Vishnu Norton Pastel, MD or the following Advanced Practice Provider on your designated Care Team:   Sharlene Dory, NP    Other Instructions

## 2023-09-21 NOTE — Progress Notes (Signed)
 Cardiology Office Note  Date: 09/21/2023   ID: Kristina Lewis, DOB 1965-07-14, MRN [id number]  PCP:  Elfredia Nevins, MD  Cardiologist:  None Electrophysiologist:  None   History of Present Illness: Kristina Lewis is a 58 y.o. female known to have Parkinson disease s/p deep brain stimulator in place, bladder stimulator in place, history of PVCs is here for cardiology follow-up visit.  Patient was diagnosed with PVCs in the past for which she has been taking metoprolol but due to severe fatigue, that was switched to diltiazem 60 mg every 8 hours as needed in the last clinic visit.  Unable to perform EKG or obtain event monitor due to deep brain stimulator in place.  She is here for follow-up visit.  Walks with a walker.  She has frequent falls recently from Parkinson disease.  She says she has dizziness once again well.  Palpitations are very well-controlled with diltiazem 60 mg, she is taking it twice a day and round-the-clock.  No angina, DOE.  No syncope.  Past Medical History:  Diagnosis Date   Anxiety    Atypical chest pain 03/15/2017   Bulging disc    CERVICAL   GERD (gastroesophageal reflux disease)    Hemorrhoids    IC (interstitial cystitis)    bladder stimulator 10/2012   Interstitial cystitis    Left leg pain    Migraines    Palpitations 03/15/2017   Parkinson's disease (HCC)    deep brain stimulator placed   PONV (postoperative nausea and vomiting)    PVC (premature ventricular contraction) 04/04/2017   Seasonal allergies    Urge incontinence     Past Surgical History:  Procedure Laterality Date   BIOPSY N/A 10/27/2014   Procedure: BIOPSY;  Surgeon: Corbin Ade, MD;  Location: [facility];  Service: Endoscopy;  Laterality: N/A;  duodenal, esophageal   BREAST ENHANCEMENT SURGERY  AGE 33   CESAREAN SECTION  12/2000  &  01-26-2003   X2   COLONOSCOPY WITH PROPOFOL N/A 10/27/2014   Dr. Jena Gauss: Minimal internal heorrhoids;otherwise normal rectum, colon and terminal ileum.     COLONOSCOPY WITH PROPOFOL N/A 09/14/2022   Procedure: COLONOSCOPY WITH PROPOFOL;  Surgeon: Corbin Ade, MD;  Location: [facility];  Service: Endoscopy;  Laterality: N/A;  145pm, asa 3, Has deep brain stimulator and urinary bladder stimulator   cranial neurostimulator implant  04/2018   for deep brain simulation. [facility]   CYSTO/ URETHRAL DILATION/ HOD  X2  LAST ONE 12-19-2004   ESOPHAGEAL DILATION N/A 10/27/2014   Procedure: ESOPHAGEAL DILATION;  Surgeon: Corbin Ade, MD;  Location: [facility];  Service: Endoscopy;  Laterality: N/AElease Hashimoto 52   ESOPHAGOGASTRODUODENOSCOPY (EGD) WITH PROPOFOL N/A 10/27/2014   Dr. Jena Gauss: Normal EGD. status post maloney dilation follwed by esophageal and duodenal biopsy. benign esophageal and duodenal biopsies   ESOPHAGOGASTRODUODENOSCOPY (EGD) WITH PROPOFOL N/A 09/14/2022   Procedure: ESOPHAGOGASTRODUODENOSCOPY (EGD) WITH PROPOFOL;  Surgeon: Corbin Ade, MD;  Location: [facility];  Service: Endoscopy;  Laterality: N/A;   HEMORRHOID BANDING  2016   Dr.Rourk   INTERSTIM IMPLANT PLACEMENT N/A 10/08/2012   Procedure: [name] IMPLANT FIRST STAGE;  Surgeon: Martina Sinner, MD;  Location: [facility];  Service: Urology;  Laterality: N/A;   INTERSTIM IMPLANT PLACEMENT N/A 10/08/2012   Procedure: [name] IMPLANT SECOND STAGE;  Surgeon: Martina Sinner, MD;  Location: [facility];  Service: Urology;  Laterality: N/A;   LASIK  bilateral   MALONEY DILATION N/A 09/14/2022   Procedure: [name] DILATION;  Surgeon: Corbin Ade, MD;  Location: [facility];  Service: Endoscopy;  Laterality: N/A;   TONSILLECTOMY  AGE 34'S    Current Outpatient Medications  Medication Sig Dispense Refill   ALPRAZolam (XANAX) 1 MG tablet Take 1.5 mg by mouth at bedtime.     BIOTIN PO Take 1 tablet by mouth daily.     Calcium Carbonate (CALCIUM 600 PO) Take by mouth.     carbidopa-levodopa (SINEMET IR) 25-100 MG tablet Take 1 tablet by  mouth 3 (three) times daily.     Cholecalciferol (VITAMIN D3) 125 MCG (5000 UT) TABS Take 1 tablet by mouth daily.     dicyclomine (BENTYL) 10 MG capsule TAKE 1 CAPSULE BY MOUTH TWICE DAILY AS NEEDED FOR  CRAMPS  AND  LOOSE  STOOL 60 capsule 5   diltiazem (CARDIZEM) 60 MG tablet Take 1 tablet (60 mg total) by mouth every 8 (eight) hours as needed. 270 tablet 3   DULoxetine (CYMBALTA) 30 MG capsule Take 30 mg by mouth daily.     esomeprazole (NEXIUM) 40 MG capsule TAKE 1 CAPSULE DAILY BEFORE BREAKFAST 90 capsule 3   HYDROcodone-acetaminophen (NORCO) 5-325 MG tablet Take 1 tablet by mouth every 8 (eight) hours as needed for moderate pain (pain score 4-6). 5 tablet 0   lidocaine (LIDODERM) 5 % Place 1 patch onto the skin daily. Remove & Discard patch within 12 hours or as directed by MD 30 patch 0   lipase/protease/amylase (CREON) 36000 UNITS CPEP capsule Take 2 capsules with meals and 1 capsule with snack 300 capsule 11   TURMERIC CURCUMIN PO Take 1 tablet by mouth daily.     zonisamide (ZONEGRAN) 100 MG capsule Take 300 mg by mouth at bedtime.     No current facility-administered medications for this visit.   Allergies:  Other, Tioconazole, Monistat [miconazole], Tape, and Silicone   Social History: The patient  reports that she has never smoked. She has never been exposed to tobacco smoke. She has never used smokeless tobacco. She reports current alcohol use. She reports that she does not use drugs.   Family History: The patient's family history includes Atrial fibrillation in her father; Breast cancer in her maternal aunt; CAD in her father; Cancer in her cousin and father; Colon polyps in her mother; Diabetes in her father, mother, and paternal grandfather; Heart disease in her maternal grandmother; Hypertension in her father and maternal grandmother; Stroke in her paternal grandmother.   ROS:  Please see the history of present illness. Otherwise, complete review of systems is positive for  none.  All other systems are reviewed and negative.   Physical Exam: VS:  Ht 5' 3.5" (1.613 m)   LMP 04/07/2016   BMI 21.33 kg/m , BMI Body mass index is 21.33 kg/m.  Wt Readings from Last 3 Encounters:  09/10/23 122 lb 5.7 oz (55.5 kg)  08/15/23 122 lb 6.4 oz (55.5 kg)  07/18/23 105 lb 13.1 oz (48 kg)    General: Patient appears comfortable at rest. HEENT: Conjunctiva and lids normal, oropharynx clear with moist mucosa. Neck: Supple, no elevated JVP or carotid bruits, no thyromegaly. Lungs: Clear to auscultation, nonlabored breathing at rest. Cardiac: Regular rate and rhythm, no S3 or significant systolic murmur, no pericardial rub. Abdomen: Soft, nontender, no hepatomegaly, bowel sounds present, no guarding or rebound. Extremities: No pitting edema, distal pulses 2+. Skin: Warm and dry. Musculoskeletal: No kyphosis. Neuropsychiatric: Alert  and oriented x3, affect grossly appropriate.  Recent Labwork: 10/10/2022: TSH 1.650 04/27/2023: ALT 9; AST 20; BUN 13; Creatinine, Ser 0.80; Hemoglobin 12.9; Magnesium 2.2; Platelets 290; Potassium 3.2; Sodium 139     Component Value Date/Time   CHOL 233 (H) 04/09/2017 1058   TRIG 68 04/09/2017 1058   HDL 121 04/09/2017 1058   CHOLHDL 1.9 04/09/2017 1058   CHOLHDL 2.0 08/17/2011 1020   VLDL 10 08/17/2011 1020   LDLCALC 98 04/09/2017 1058     Assessment and Plan:  Palpitations: She has a history of PVCs in the past for which she was on metoprolol but due to severe fatigue that was switched to diltiazem 60 mg, she was taking it twice a day with no palpitations.  Will switch this to diltiazem 60 mg every 12 hours.  Not able to obtain EKG or event monitor due to deep brain stimulator in place.  Dizziness: She has occasional dizziness once in a while.  Orthostatics obtained at her neurologist office were negative for orthostatic hypotension.  She is instructed to reach out to our clinic for appointment if she develops orthostatic hypotension  in the future.  Parkinson disease s/p DBS: Frequent falls due to Parkinson disease.  Decreased quality of life due to falls.  Follows with neurology.  She is filing for for disability.  Bladder stimulator in place: No acute intervention.    Medication Adjustments/Labs and Tests Ordered: Current medicines are reviewed at length with the patient today.  Concerns regarding medicines are outlined above.    Disposition:  Follow up 6 months  Signed, Mayda Shippee Verne Spurr, MD, 09/21/2023 9:24 AM    [facility] at [facility] [address] [place], [place] [address]

## 2023-11-12 ENCOUNTER — Other Ambulatory Visit: Payer: Self-pay | Admitting: Internal Medicine

## 2023-11-22 ENCOUNTER — Other Ambulatory Visit: Payer: Self-pay | Admitting: Internal Medicine

## 2023-12-20 HISTORY — PX: COLON SURGERY: SHX602

## 2024-01-30 ENCOUNTER — Other Ambulatory Visit: Payer: Self-pay

## 2024-01-30 DIAGNOSIS — M818 Other osteoporosis without current pathological fracture: Secondary | ICD-10-CM

## 2024-02-21 LAB — MAGNESIUM: Magnesium: 2.3 mg/dL (ref 1.6–2.3)

## 2024-02-21 LAB — PHOSPHORUS: Phosphorus: 4 mg/dL (ref 3.0–4.3)

## 2024-02-21 LAB — COMPREHENSIVE METABOLIC PANEL WITH GFR
ALT: 7 IU/L (ref 0–32)
AST: 21 IU/L (ref 0–40)
Albumin: 4.5 g/dL (ref 3.8–4.9)
Alkaline Phosphatase: 97 IU/L (ref 44–121)
BUN/Creatinine Ratio: 13 (ref 9–23)
BUN: 13 mg/dL (ref 6–24)
Bilirubin Total: 0.2 mg/dL (ref 0.0–1.2)
CO2: 27 mmol/L (ref 20–29)
Calcium: 10.5 mg/dL — ABNORMAL HIGH (ref 8.7–10.2)
Chloride: 103 mmol/L (ref 96–106)
Creatinine, Ser: 1.01 mg/dL — ABNORMAL HIGH (ref 0.57–1.00)
Globulin, Total: 2.3 g/dL (ref 1.5–4.5)
Glucose: 94 mg/dL (ref 70–99)
Potassium: 5.4 mmol/L — ABNORMAL HIGH (ref 3.5–5.2)
Sodium: 141 mmol/L (ref 134–144)
Total Protein: 6.8 g/dL (ref 6.0–8.5)
eGFR: 65 mL/min/1.73 (ref >59)

## 2024-02-21 LAB — T4, FREE: Free T4: 0.95 ng/dL (ref 0.82–1.77)

## 2024-02-21 LAB — PTH, INTACT AND CALCIUM: PTH: 22 pg/mL (ref 15–65)

## 2024-02-21 LAB — VITAMIN D 25 HYDROXY (VIT D DEFICIENCY, FRACTURES): Vit D, 25-Hydroxy: 39 ng/mL (ref 30.0–100.0)

## 2024-02-21 LAB — TSH: TSH: 1.98 u[IU]/mL (ref 0.450–4.500)

## 2024-02-25 ENCOUNTER — Encounter: Payer: Self-pay | Admitting: "Endocrinology

## 2024-02-25 ENCOUNTER — Ambulatory Visit: Admitting: "Endocrinology

## 2024-02-25 VITALS — BP 82/56 | HR 84 | Ht 63.5 in | Wt 124.8 lb

## 2024-02-25 DIAGNOSIS — M818 Other osteoporosis without current pathological fracture: Secondary | ICD-10-CM | POA: Diagnosis not present

## 2024-02-25 DIAGNOSIS — E559 Vitamin D deficiency, unspecified: Secondary | ICD-10-CM | POA: Diagnosis not present

## 2024-02-25 NOTE — Progress Notes (Signed)
 Endocrinology Consult Note                                            02/25/2024, 6:29 PM   Subjective:    Patient ID: Kristina Lewis, female    DOB: 02-Apr-1966, PCP Bertell Satterfield, MD   Past Medical History:  Diagnosis Date   Anxiety    Atypical chest pain 03/15/2017   Bulging disc    CERVICAL   GERD (gastroesophageal reflux disease)    Hemorrhoids    IC (interstitial cystitis)    bladder stimulator 10/2012   Interstitial cystitis    Left leg pain    Migraines    Palpitations 03/15/2017   Parkinson's disease (HCC)    deep brain stimulator placed   PONV (postoperative nausea and vomiting)    PVC (premature ventricular contraction) 04/04/2017   Seasonal allergies    Urge incontinence    Past Surgical History:  Procedure Laterality Date   BIOPSY N/A 10/27/2014   Procedure: BIOPSY;  Surgeon: Lamar CHRISTELLA Hollingshead, MD;  Location: [facility];  Service: Endoscopy;  Laterality: N/A;  duodenal, esophageal   BREAST ENHANCEMENT SURGERY  AGE 37   CESAREAN SECTION  12/2000  &  01-26-2003   X2   COLON SURGERY  12/20/2023   COLONOSCOPY WITH PROPOFOL  N/A 10/27/2014   Dr. Hollingshead: Minimal internal heorrhoids;otherwise normal rectum, colon and terminal ileum.    COLONOSCOPY WITH PROPOFOL  N/A 09/14/2022   Procedure: COLONOSCOPY WITH PROPOFOL ;  Surgeon: Hollingshead Lamar CHRISTELLA, MD;  Location: [facility];  Service: Endoscopy;  Laterality: N/A;  145pm, asa 3, Has deep brain stimulator and urinary bladder stimulator   cranial neurostimulator implant  04/2018   for deep brain simulation. [facility]   CYSTO/ URETHRAL DILATION/ HOD  X2  LAST ONE 12-19-2004   ESOPHAGEAL DILATION N/A 10/27/2014   Procedure: ESOPHAGEAL DILATION;  Surgeon: Lamar CHRISTELLA Hollingshead, MD;  Location: [facility];  Service: Endoscopy;  Laterality: N/AMERL [address] 52   ESOPHAGOGASTRODUODENOSCOPY (EGD) WITH PROPOFOL  N/A 10/27/2014   Dr. Hollingshead: Normal EGD. status post maloney dilation follwed by esophageal and duodenal biopsy. benign esophageal and  duodenal biopsies   ESOPHAGOGASTRODUODENOSCOPY (EGD) WITH PROPOFOL  N/A 09/14/2022   Procedure: ESOPHAGOGASTRODUODENOSCOPY (EGD) WITH PROPOFOL ;  Surgeon: Hollingshead Lamar CHRISTELLA, MD;  [facility];  Service: Endoscopy;  Laterality: N/A;   HEMORRHOID BANDING  2016   Dr.Rourk   INTERSTIM IMPLANT PLACEMENT N/A 10/08/2012   Procedure: RENNA IMPLANT FIRST STAGE;  Surgeon: Glendia DELENA Elizabeth, MD;  Location: [facility];  Service: Urology;  Laterality: N/A;   INTERSTIM IMPLANT PLACEMENT N/A 10/08/2012   Procedure: RENNA IMPLANT SECOND STAGE;  Surgeon: Glendia DELENA Elizabeth, MD;  Location: [facility];  Service: Urology;  Laterality: N/A;   LASIK     bilateral   MALONEY DILATION N/A 09/14/2022   Procedure: STAI DILATION;  Surgeon: Hollingshead Lamar CHRISTELLA, MD;  Location: [facility];  Service: Endoscopy;  Laterality: N/A;   TONSILLECTOMY  AGE 4'S   Social History   Socioeconomic History   Marital status: Divorced    Spouse name: Not on file   Number of children: 3   Years of education: Not on file   Highest education level: Not on file  Occupational History   Not on file  Tobacco Use   Smoking status: Never  Passive exposure: Never   Smokeless tobacco: Never  Vaping Use   Vaping status: Never Used  Substance and Sexual Activity   Alcohol use: Yes    Comment: occasionally   Drug use: No   Sexual activity: Not Currently    Birth control/protection: Pill  Other Topics Concern   Not on file  Social History Narrative   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  She had three healthy children at home.  Husband is Programmer, multimedia.     Social Drivers of Corporate investment banker Strain: Not on file  Food Insecurity: Low Risk  (08/14/2023)   Received from [facility]   Hunger Vital Sign    Within the past 12 months, you worried that your food would run out before you got money to buy more: Never true    Within the past 12 months, the food you bought just didn't last and you didn't  have money to get more. : Never true  Transportation Needs: No Transportation Needs (08/14/2023)   Received from [facility]    In the past 12 months, has lack of reliable transportation kept you from medical appointments, meetings, work or from getting things needed for daily living? : No  Physical Activity: Not on file  Stress: Not on file  Social Connections: Unknown (11/13/2021)   Received from [facility]   Social Network    Social Network: Not on file   Family History  Problem Relation Age of Onset   Diabetes Mother        Died, 61   Colon polyps Mother        less than age 5   Diabetes Father        Living, 56   Cancer Father        prostate   Hypertension Father    Atrial fibrillation Father    CAD Father    Hypertension Maternal Grandmother    Heart disease Maternal Grandmother    Diabetes Paternal Grandfather    Breast cancer Maternal Aunt    Cancer Cousin        MATERNAL   Stroke Paternal Grandmother    Colon cancer Neg Hx    Outpatient Encounter Medications as of 02/25/2024  Medication Sig   baclofen  (LIORESAL ) 10 MG tablet Take 10 mg by mouth as needed for muscle spasms.   busPIRone (BUSPAR) 5 MG tablet Take 5 mg by mouth 2 (two) times daily.   Cholecalciferol (VITAMIN D3) 20 MCG (800 UNIT) TABS Take by mouth 2 (two) times daily.   DULoxetine (CYMBALTA) 30 MG capsule Take 30 mg by mouth daily. (Patient taking differently: Take 60 mg by mouth daily.)   Polyethylene Glycol 3350  (CLEARLAX PO) Take by mouth every other day.   Polyethylene Glycol 3350  POWD by Does not apply route 2 (two) times daily.   senna-docusate (SENOKOT-S) 8.6-50 MG tablet Take 1 tablet by mouth 2 (two) times daily.   zonisamide (ZONEGRAN) 100 MG capsule Take 300 mg by mouth at bedtime. (Patient taking differently: Take 200 mg by mouth at bedtime.)   ALPRAZolam (XANAX) 1 MG tablet Take 1.5 mg by mouth at bedtime.   BIOTIN PO Take 1 tablet by mouth daily.   Calcium  Carbonate (CALCIUM 600 PO) Take by mouth.   carbidopa-levodopa (SINEMET IR) 25-100 MG tablet Take 1 tablet by mouth 3 (three) times daily.   Cholecalciferol (VITAMIN D3) 125 MCG (5000 UT) TABS Take 1 tablet by mouth daily.   dicyclomine  (BENTYL ) 10 MG capsule  TAKE 1 CAPSULE BY MOUTH TWICE DAILY AS NEEDED FOR  CRAMPS  AND  LOOSE  STOOL   diltiazem  (CARDIZEM ) 60 MG tablet Take 1 tablet (60 mg total) by mouth every 12 (twelve) hours as needed.   esomeprazole  (NEXIUM ) 40 MG capsule TAKE 1 CAPSULE DAILY BEFORE BREAKFAST   HYDROcodone -acetaminophen  (NORCO) 5-325 MG tablet Take 1 tablet by mouth every 8 (eight) hours as needed for moderate pain (pain score 4-6).   lidocaine  (LIDODERM ) 5 % Place 1 patch onto the skin daily. Remove & Discard patch within 12 hours or as directed by MD   lipase/protease/amylase (CREON ) 36000 UNITS CPEP capsule Take 2 capsules with meals and 1 capsule with snack   TURMERIC CURCUMIN PO Take 1 tablet by mouth daily.   No facility-administered encounter medications on file as of 02/25/2024.   ALLERGIES: Allergies  Allergen Reactions   Other Diarrhea and Swelling    celery and tomatoes   Tioconazole Itching and Swelling   Monistat [Miconazole] Swelling   Tape Itching    VERY IRRITATED Paper tape is Ok   Silicone Itching and Rash    VACCINATION STATUS:  There is no immunization history on file for this patient.  HPI LUBNA STEGEMAN is 58 y.o. female who presents today with a medical history as above. she is being seen in follow-up after she was seen in consultation for osteoporosis requested by Bertell Satterfield, MD.  Patient is accompanied by her cousin to clinic. Patient has a complicated and long medical history including Parkinson's disease.  She is on medications including Sinemet, Cymbalta, Creon , as well as hydrocodone . Patient has always been on lighter side of body build, denies any significant history of steroids exposure.  She did have rib fractures after a  fall several months ago. With the family member's recommendation, she underwent screening bone density in December 2024 which revealed  finding of significant bone loss on the spine as well as hips. Patient denies any significant height loss, nor fragility fractures. She ambulates with a cane due to disequilibrium.  After discussing her options during her last visit, she was given a dose of Reclast  in March 2025.  She did have flulike symptoms subsequent to the infusion from which she recovered without problems. She is not on any particular diet or exercise program. She went to menopause in her early 52s. Admittedly, it is less food especially less protein.  She was supposed to be on calcium and vitamin D  supplement, not consistently taking. She has no new complaints today.  Review of Systems  Constitutional: +mildly fluctuating body weight , current BMI of 21.76 kg/m, no fatigue, no subjective hyperthermia, no subjective hypothermia Eyes: no blurry vision, no xerophthalmia   Objective:       02/25/2024    2:53 PM 09/21/2023    9:21 AM 09/10/2023    7:42 PM  Vitals with BMI  Height 5' 3.5 5' 3.5   Weight 124 lbs 13 oz 118 lbs 3 oz   BMI 21.76 20.61   Systolic 82 98 125  Diastolic 56 62 76  Pulse 84 74 83    BP (!) 82/56   Pulse 84   Ht 5' 3.5 (1.613 m)   Wt 124 lb 12.8 oz (56.6 kg)   LMP 04/07/2016   BMI 21.76 kg/m   Wt Readings from Last 3 Encounters:  02/25/24 124 lb 12.8 oz (56.6 kg)  09/21/23 118 lb 3.2 oz (53.6 kg)  09/10/23 122 lb 5.7 oz (55.5 kg)  Physical Exam  Constitutional:  Body mass index is 21.76 kg/m.,  not in acute distress, normal state of mind  Musculoskeletal: + Mild scoliosis towards the right side,  strength intact in all four extremities, no peripheral edema Skin: moist, warm, no rashes   CMP ( most recent) CMP     Component Value Date/Time   NA 141 02/19/2024 1125   K 5.4 (H) 02/19/2024 1125   CL 103 02/19/2024 1125   CO2 27  02/19/2024 1125   GLUCOSE 94 02/19/2024 1125   GLUCOSE 98 04/27/2023 1546   BUN 13 02/19/2024 1125   CREATININE 1.01 (H) 02/19/2024 1125   CALCIUM 10.5 (H) 02/19/2024 1125   PROT 6.8 02/19/2024 1125   ALBUMIN 4.5 02/19/2024 1125   AST 21 02/19/2024 1125   ALT 7 02/19/2024 1125   ALKPHOS 97 02/19/2024 1125   BILITOT 0.2 02/19/2024 1125   EGFR 65 02/19/2024 1125   GFRNONAA >60 04/27/2023 1546     Diabetic Labs (most recent): Lab Results  Component Value Date   HGBA1C 5.7 03/18/2013     Lipid Panel ( most recent) Lipid Panel     Component Value Date/Time   CHOL 233 (H) 04/09/2017 1058   TRIG 68 04/09/2017 1058   HDL 121 04/09/2017 1058   CHOLHDL 1.9 04/09/2017 1058   CHOLHDL 2.0 08/17/2011 1020   VLDL 10 08/17/2011 1020   LDLCALC 98 04/09/2017 1058   LABVLDL 14 04/09/2017 1058      Lab Results  Component Value Date   TSH 1.980 02/19/2024   TSH 1.650 10/10/2022   TSH 1.610 03/08/2017   TSH 4.078 11/28/2011   TSH 2.665 08/17/2011   FREET4 0.95 02/19/2024   FREET4 1.18 03/08/2017    DEXA scan from June 25, 2023  [facility] Spine L1-L4 06/25/2023 56.2 Osteoporosis -3.7 0.736 g/cm2 - -   DualFemur Neck Left 06/25/2023 56.2 Osteoporosis -3.2 0.591 g/cm2 - -   DualFemur Total Mean 06/25/2023 56.2 Osteoporosis -2.7 0.663 g/cm2 - -   Left Forearm Radius 33% 06/25/2023 56.2 Osteoporosis -2.5 0.533 g/cm2 - - ASSESSMENT: BMD as determined from [facility] Spine L1-L4 is 0.736 g/cm2 with a T-Score of -3.7. This patient is considered osteoporotic according to [facility]) criteria. The scan quality is good.   Assessment & Plan:   1. Other osteoporosis without current pathological fracture (Primary)  2.  Vitamin D  deficiency  3.  Exocrine pancreatic insufficiency  - I have reviewed her available available records and clinically evaluated the patient. - Based on these reviews, she has severe osteoporosis.  Etiology seems to be multifactorial including  possible malabsorption as evidenced by the fact that she is on Creon .  She does not have significant steroid exposure, went to menopause in her 98s.  She does not have any significant history of fragility fracture.  She did have flulike illness following her Reclast  infusion in March 2025.  She wanted to know her other options.  I had a long discussion with her about Prolia and Forteo.  She seemed to have medication access problem due to cost. She is open to try Prolia if it is covered by her insurance.  If not, she is willing to resume her Reclast  treatment.  Her second dose will be due in March 2026.  Her labs are favorable.  She will return 2 weeks before her scheduled Reclast  with labs including CMP/renal function.  She understands the risks and complications of untreated osteoporosis.  If she tolerates her next infusion,  Reclast  will be planned for 3-6-yearly infusions total and she will have repeat bone density years from her last study which will be in December 2026.  She is vitamin D  replete at 70.  She is encouraged to stay on low-dose vitamin D  and calcium supplements.  She is also encouraged to pick up more protein from her diet including chicken, fish, yogurt as well as egg whites.  She is also advised to increase  more plant-based protein to enhance her treatment response.   Fall precautions were discussed.  Patient understands treatment for osteoporosis and long-term engagement.  She is on Creon  for exocrine pancreatic insufficiency, advised to continue. - she is advised to maintain close follow up with Bertell Satterfield, MD for primary care needs.   I spent  25  minutes in the care of the patient today including review of labs from Thyroid  Function, CMP, and other relevant labs ; imaging/biopsy records (current and previous including abstractions from other facilities); face-to-face time discussing  her lab results and symptoms, medications doses, her options of short and long term  treatment based on the latest standards of care / guidelines;   and documenting the encounter.  Kristina Lewis  participated in the discussions, expressed understanding, and voiced agreement with the above plans.  All questions were answered to her satisfaction. she is encouraged to contact clinic should she have any questions or concerns prior to her return visit.   Follow up plan: Return in about 6 months (around 08/27/2024) for F/U with Pre-visit Labs.   Ranny Earl, MD [facility] [address] [place], [place] [address] Phone: [phone]  Fax: [phone]     02/25/2024, 6:29 PM  This note was partially dictated with voice recognition software. Similar sounding words can be transcribed inadequately or may not  be corrected upon review.

## 2024-02-26 ENCOUNTER — Ambulatory Visit: Payer: 59 | Admitting: "Endocrinology

## 2024-05-07 ENCOUNTER — Encounter: Payer: Self-pay | Admitting: "Endocrinology

## 2024-05-07 DIAGNOSIS — F331 Major depressive disorder, recurrent, moderate: Secondary | ICD-10-CM | POA: Diagnosis not present

## 2024-05-07 DIAGNOSIS — F411 Generalized anxiety disorder: Secondary | ICD-10-CM | POA: Diagnosis not present

## 2024-05-08 DIAGNOSIS — M47816 Spondylosis without myelopathy or radiculopathy, lumbar region: Secondary | ICD-10-CM | POA: Diagnosis not present

## 2024-05-20 ENCOUNTER — Encounter: Payer: Self-pay | Admitting: Internal Medicine

## 2024-05-22 DIAGNOSIS — R1084 Generalized abdominal pain: Secondary | ICD-10-CM | POA: Diagnosis not present

## 2024-05-22 DIAGNOSIS — N281 Cyst of kidney, acquired: Secondary | ICD-10-CM | POA: Diagnosis not present

## 2024-05-26 ENCOUNTER — Ambulatory Visit: Payer: Self-pay

## 2024-05-26 NOTE — Telephone Encounter (Signed)
 Reason for Disposition . MILD-MODERATE diarrhea (e.g., 1-6 times / day more than normal)  Answer Assessment - Initial Assessment Questions Scheduled for a PCP in Jan, earliest available. States symptoms that brought her into the ED are improving.   1. DIARRHEA SEVERITY: How bad is the diarrhea? How many more stools have you had in the past 24 hours than normal?      2  2.ONSET: When did the diarrhea begin?      10/31  3. STOOL DESCRIPTION:  How loose or watery is the diarrhea? What is the stool color? Is there any blood or mucous in the stool?     Loose  4. VOMITING: Are you also vomiting? If Yes, ask: How many times in the past 24 hours?      Denies  5. ABDOMEN PAIN: Are you having any abdomen pain? If Yes, ask: What does it feel like? (e.g., crampy, dull, intermittent, constant)      Cramping and dull, comes and goes  6. ABDOMEN PAIN SEVERITY: If present, ask: How bad is the pain?  (e.g., Scale 1-10; mild, moderate, or severe)     4/10  7. ORAL INTAKE: If vomiting, Have you been able to drink liquids? How much liquids have you had in the past 24 hours?     Yes  8. HYDRATION: Any signs of dehydration? (e.g., dry mouth [not just dry lips], too weak to stand, dizziness, new weight loss) When did you last urinate?     Denies  9. ANTIBIOTIC USE: Are you taking antibiotics now or have you taken antibiotics in the past 2 months?       Taken abx for h. Pylori can't remember or what is is. States been on it twice.  10. OTHER SYMPTOMS: Do you have any other symptoms? (e.g., fever, blood in stool)       Not much of an appetite  Protocols used: Diarrhea-A-AH

## 2024-05-26 NOTE — Telephone Encounter (Signed)
   Reason for Triage: Patient reporting diarrhea since 05/02/2024, patient stated that she went to ER this past Thursday for this symptom as well as constipation but that she is still experiencing diarrhea.

## 2024-05-28 ENCOUNTER — Encounter: Payer: Self-pay | Admitting: "Endocrinology

## 2024-06-04 ENCOUNTER — Encounter: Payer: Self-pay | Admitting: Obstetrics and Gynecology

## 2024-06-04 ENCOUNTER — Other Ambulatory Visit (HOSPITAL_COMMUNITY)
Admission: RE | Admit: 2024-06-04 | Discharge: 2024-06-04 | Disposition: A | Discharge status: Home / Self Care | Source: Ambulatory Visit | Attending: Obstetrics and Gynecology | Admitting: Obstetrics and Gynecology

## 2024-06-04 ENCOUNTER — Ambulatory Visit: Admitting: Obstetrics and Gynecology

## 2024-06-04 VITALS — BP 112/70 | HR 76 | Ht 63.0 in | Wt 126.0 lb

## 2024-06-04 DIAGNOSIS — G20A1 Parkinson's disease without dyskinesia, without mention of fluctuations: Secondary | ICD-10-CM

## 2024-06-04 DIAGNOSIS — N898 Other specified noninflammatory disorders of vagina: Secondary | ICD-10-CM | POA: Diagnosis not present

## 2024-06-04 DIAGNOSIS — Z01419 Encounter for gynecological examination (general) (routine) without abnormal findings: Secondary | ICD-10-CM | POA: Diagnosis not present

## 2024-06-04 DIAGNOSIS — Z124 Encounter for screening for malignant neoplasm of cervix: Secondary | ICD-10-CM | POA: Diagnosis not present

## 2024-06-04 DIAGNOSIS — R9389 Abnormal findings on diagnostic imaging of other specified body structures: Secondary | ICD-10-CM

## 2024-06-04 NOTE — Progress Notes (Signed)
 Here for annual.  Pt denies having an abnormal PAP.   Had emergency surgery in June of this year to take out part of her intestines due to being twisted.   Last week she had similar pain in abdomen. She was concerned due to previous surgery but was told she had a thickening of her uterus. She is here to have that assessed today.   Pt concerned she may have yeast due to taking antibiotics. Pt reports discharge and itching.

## 2024-06-04 NOTE — Progress Notes (Unsigned)
 ANNUAL EXAM Patient name: Kristina Lewis MRN [id number]  Date of birth: 07-Jul-1965 Chief Complaint:   No chief complaint on file.  History of Present Illness:   Kristina Lewis is a 58 y.o. G70P2003  female being seen today for a routine annual exam.  Current complaints: She had emergency surgery in June and had removal of part of her intestines. Reports she had similar pain last week. They reported thickened uterus   Hx of cesarean section x 2   Pt concerned she may have yeast due to taking antibiotics. Pt reports discharge and itching.    Patient's last menstrual period was 04/07/2016.   The pregnancy intention screening data noted above was reviewed. Potential methods of contraception were discussed. The patient elected to proceed with No data recorded.   Gynecologic History Patient's last menstrual period was . Contraception:  Last Pap:2016 . Results were: {Normal/Other:27013::Normal} Last mammogram: . Results were:{Normal/Other:27013::Normal}     09/05/2023   10:26 AM  Depression screen PHQ 2/9  Decreased Interest 1  Down, Depressed, Hopeless 1  PHQ - 2 Score 2  Altered sleeping 1  Tired, decreased energy 0  Change in appetite 0  Feeling bad or failure about yourself  0  Trouble concentrating 0  Moving slowly or fidgety/restless 0  Suicidal thoughts 0  PHQ-9 Score 3   Difficult doing work/chores Not difficult at all     Data saved with a previous flowsheet row definition         No data to display           Review of Systems:   Pertinent items are noted in HPI Denies any headaches, blurred vision, fatigue, shortness of breath, chest pain, abdominal pain, abnormal vaginal discharge/itching/odor/irritation, problems with periods, bowel movements, urination, or intercourse unless otherwise stated above. Pertinent History Reviewed:  Reviewed past medical,surgical, social and family history.  Reviewed problem list, medications and allergies. Physical  Assessment:   Vitals:   06/04/24 0844  BP: 112/70  Pulse: 76  Weight: 126 lb (57.2 kg)  Height: 5' 3 (1.6 m)  Body mass index is 22.32 kg/m.        Physical Examination:   General appearance - well appearing, and in no distress  Mental status - alert, oriented   Psych:  She has a normal mood and affect  Skin - warm and dry, normal color  Chest - effort normal, all lung fields clear to auscultation bilaterally  Heart - normal rate and regular rhythm  Neck:  midline trachea, no thyromegaly or nodules  Breasts - breasts appear normal, no suspicious masses, no skin or nipple changes or  axillary nodes  Abdomen - soft, nontender, nondistended  Pelvic - VULVA: normal appearing vulva with no masses, tenderness or lesions  VAGINA: normal appearing vagina with normal color and discharge, no lesions  CERVIX: normal appearing cervix without discharge or lesions, no CMT  Thin prep pap is done w HR HPV cotesting  UTERUS: uterus is felt to be normal size, shape, consistency and nontender   ADNEXA: No adnexal masses or tenderness noted.  Extremities:  No swelling or varicosities noted  Chaperone present for exam  No results found for this or any previous visit (from the past 24 hours).  Assessment & Plan:  1) Well-Woman Exam  2) ***  Labs/procedures today: ***  Mammogram: {Mammo f/u:25212::@ 58yo}, or sooner if problems Colonoscopy: {TCS f/u:25213::@ 58yo}, or sooner if problems  No orders of the defined types were placed  in this encounter.   Meds: No orders of the defined types were placed in this encounter.   Follow-up: No follow-ups on file.  Nidia Daring, FNP 06/04/2024 9:13 AM

## 2024-06-05 DIAGNOSIS — R197 Diarrhea, unspecified: Secondary | ICD-10-CM | POA: Diagnosis not present

## 2024-06-05 DIAGNOSIS — R935 Abnormal findings on diagnostic imaging of other abdominal regions, including retroperitoneum: Secondary | ICD-10-CM | POA: Diagnosis not present

## 2024-06-05 DIAGNOSIS — K589 Irritable bowel syndrome without diarrhea: Secondary | ICD-10-CM | POA: Diagnosis not present

## 2024-06-05 LAB — CERVICOVAGINAL ANCILLARY ONLY
Candida Glabrata: NEGATIVE
Candida Vaginitis: NEGATIVE
Comment: NEGATIVE
Comment: NEGATIVE

## 2024-06-06 ENCOUNTER — Ambulatory Visit: Payer: Self-pay

## 2024-06-06 LAB — CYTOLOGY - PAP
Adequacy: ABSENT
Comment: NEGATIVE
Diagnosis: NEGATIVE
High risk HPV: NEGATIVE

## 2024-06-09 ENCOUNTER — Ambulatory Visit: Payer: Self-pay | Admitting: Obstetrics and Gynecology

## 2024-06-10 DIAGNOSIS — R935 Abnormal findings on diagnostic imaging of other abdominal regions, including retroperitoneum: Secondary | ICD-10-CM | POA: Diagnosis not present

## 2024-06-10 DIAGNOSIS — K589 Irritable bowel syndrome without diarrhea: Secondary | ICD-10-CM | POA: Diagnosis not present

## 2024-06-10 DIAGNOSIS — R197 Diarrhea, unspecified: Secondary | ICD-10-CM | POA: Diagnosis not present

## 2024-06-12 ENCOUNTER — Ambulatory Visit: Admitting: Nurse Practitioner

## 2024-06-18 DIAGNOSIS — F331 Major depressive disorder, recurrent, moderate: Secondary | ICD-10-CM | POA: Diagnosis not present

## 2024-06-18 DIAGNOSIS — F411 Generalized anxiety disorder: Secondary | ICD-10-CM | POA: Diagnosis not present

## 2024-06-23 ENCOUNTER — Encounter: Admitting: Obstetrics and Gynecology

## 2024-07-11 ENCOUNTER — Telehealth: Payer: Self-pay

## 2024-07-11 NOTE — Telephone Encounter (Signed)
 Auth Submission: NO AUTH NEEDED Site of care: Site of care: [facility] Payer: [place] MEDICAID [place] BLUE  Medication & CPT/J Code(s) submitted: Reclast  (Zolendronic acid) J3489 Diagnosis Code:  Route of submission (phone, fax, portal): phone Phone # Fax # Auth type: Buy/Bill HB Units/visits requested: 5mg  x 1 dose Reference number:  Approval from: 07/11/24 to 07/02/25

## 2024-07-14 ENCOUNTER — Other Ambulatory Visit (HOSPITAL_COMMUNITY)
Admission: RE | Admit: 2024-07-14 | Discharge: 2024-07-14 | Disposition: A | Discharge status: Home / Self Care | Source: Ambulatory Visit | Attending: Obstetrics and Gynecology | Admitting: Obstetrics and Gynecology

## 2024-07-14 ENCOUNTER — Ambulatory Visit: Admitting: Obstetrics and Gynecology

## 2024-07-14 ENCOUNTER — Encounter: Payer: Self-pay | Admitting: Obstetrics and Gynecology

## 2024-07-14 VITALS — BP 111/70 | HR 79 | Ht 63.0 in | Wt 121.0 lb

## 2024-07-14 DIAGNOSIS — R9389 Abnormal findings on diagnostic imaging of other specified body structures: Secondary | ICD-10-CM | POA: Diagnosis not present

## 2024-07-14 DIAGNOSIS — Z3202 Encounter for pregnancy test, result negative: Secondary | ICD-10-CM | POA: Diagnosis not present

## 2024-07-14 LAB — POCT URINE PREGNANCY: Preg Test, Ur: NEGATIVE

## 2024-07-14 MED ORDER — IBUPROFEN 200 MG PO TABS: 600.0000 mg | ORAL_TABLET | Freq: Once | ORAL | Status: AC

## 2024-07-14 NOTE — Progress Notes (Signed)
 59 y.o. GYN presents for ENDO Bx for Thickened Endometrium.   UPT Negative

## 2024-07-14 NOTE — Addendum Note (Signed)
 Addended by: Catlin Aycock J on: 07/14/2024 02:11 PM   Modules accepted: Orders

## 2024-07-14 NOTE — Progress Notes (Signed)
 59 yo postmenopausal here for scheduled endometrial biopsy. Patient reports being in menopause for greater than 5 years and denies any episodes of vaginal bleeding. She was noted to have an incidental finding of thickened endometrium on a CT scan following bowel resection. Patient is without any new complaints  Past Medical History:  Diagnosis Date   Anxiety    Atypical chest pain 03/15/2017   Bulging disc    CERVICAL   GERD (gastroesophageal reflux disease)    Hemorrhoids    IC (interstitial cystitis)    bladder stimulator 10/2012   Interstitial cystitis    Left leg pain    Migraines    Palpitations 03/15/2017   Parkinson's disease (HCC)    deep brain stimulator placed   PONV (postoperative nausea and vomiting)    PVC (premature ventricular contraction) 04/04/2017   Seasonal allergies    Urge incontinence    Past Surgical History:  Procedure Laterality Date   BIOPSY N/A 10/27/2014   Procedure: BIOPSY;  Surgeon: Lamar CHRISTELLA Hollingshead, MD;  Location: [facility];  Service: Endoscopy;  Laterality: N/A;  duodenal, esophageal   BREAST ENHANCEMENT SURGERY  AGE 53   CESAREAN SECTION  12/2000  &  01-26-2003   X2   COLON SURGERY  12/20/2023   COLONOSCOPY WITH PROPOFOL  N/A 10/27/2014   Dr. Hollingshead: Minimal internal heorrhoids;otherwise normal rectum, colon and terminal ileum.    COLONOSCOPY WITH PROPOFOL  N/A 09/14/2022   Procedure: COLONOSCOPY WITH PROPOFOL ;  Surgeon: Hollingshead Lamar CHRISTELLA, MD;  Location: [facility];  Service: Endoscopy;  Laterality: N/A;  145pm, asa 3, Has deep brain stimulator and urinary bladder stimulator   cranial neurostimulator implant  04/2018   for deep brain simulation. [facility]   CYSTO/ URETHRAL DILATION/ HOD  X2  LAST ONE 12-19-2004   ESOPHAGEAL DILATION N/A 10/27/2014   Procedure: ESOPHAGEAL DILATION;  Surgeon: Lamar CHRISTELLA Hollingshead, MD;  Location: [facility];  Service: Endoscopy;  Laterality: N/AMERL [place] 53   ESOPHAGOGASTRODUODENOSCOPY (EGD) WITH PROPOFOL  N/A 10/27/2014   Dr. Hollingshead:  Normal EGD. status post maloney dilation follwed by esophageal and duodenal biopsy. benign esophageal and duodenal biopsies   ESOPHAGOGASTRODUODENOSCOPY (EGD) WITH PROPOFOL  N/A 09/14/2022   Procedure: ESOPHAGOGASTRODUODENOSCOPY (EGD) WITH PROPOFOL ;  Surgeon: Hollingshead Lamar CHRISTELLA, MD;  Location: [facility];  Service: Endoscopy;  Laterality: N/A;   HEMORRHOID BANDING  2016   Dr.Rourk   INTERSTIM IMPLANT PLACEMENT N/A 10/08/2012   Procedure: RENNA IMPLANT FIRST STAGE;  Surgeon: Glendia DELENA Elizabeth, MD;  Location: [facility];  Service: Urology;  Laterality: N/A;   INTERSTIM IMPLANT PLACEMENT N/A 10/08/2012   Procedure: RENNA IMPLANT SECOND STAGE;  Surgeon: Glendia DELENA Elizabeth, MD;  Location: [facility];  Service: Urology;  Laterality: N/A;   LASIK     bilateral   MALONEY DILATION N/A 09/14/2022   Procedure: STAI DILATION;  Surgeon: Hollingshead Lamar CHRISTELLA, MD;  Location: [facility];  Service: Endoscopy;  Laterality: N/A;   TONSILLECTOMY  AGE 17'S   Family History  Problem Relation Age of Onset   Diabetes Mother        Died, 33   Colon polyps Mother        less than age 39   Diabetes Father        Living, 84   Cancer Father        prostate   Hypertension Father    Atrial fibrillation Father    CAD Father    Hypertension Maternal Grandmother  Heart disease Maternal Grandmother    Diabetes Paternal Grandfather    Breast cancer Maternal Aunt    Cancer Cousin        MATERNAL   Stroke Paternal Grandmother    Colon cancer Neg Hx    Social History   Socioeconomic History   Marital status: Divorced    Spouse name: Not on file   Number of children: 3   Years of education: Not on file   Highest education level: Not on file  Occupational History   Not on file  Tobacco Use   Smoking status: Never    Passive exposure: Never   Smokeless tobacco: Never  Vaping Use   Vaping status: Never Used  Substance and Sexual Activity   Alcohol use: Yes     Comment: occasionally   Drug use: No   Sexual activity: Not Currently    Birth control/protection: None  Other Topics Concern   Not on file  Social History Narrative   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  She had three healthy children at home.  Husband is programmer, multimedia.     Social Drivers of Health   Tobacco Use: Low Risk  (06/18/2024)   Received [facility]   Patient History    Smoking Tobacco Use: Never    Smokeless Tobacco Use: Never    Passive Exposure: Never  Financial Resource Strain: Not on file  Food Insecurity: Low Risk (08/14/2023)   Received from [facility]   Epic    Within the past 12 months, you worried that your food would run out before you got money to buy more: Never true    Within the past 12 months, the food you bought just didn't last and you didn't have money to get more. : Never true  Transportation Needs: No Transportation Needs (08/14/2023)   Received from [facility]    In the past 12 months, has lack of reliable transportation kept you from medical appointments, meetings, work or from getting things needed for daily living? : No  Physical Activity: Not on file  Stress: Not on file  Social Connections: Unknown (11/13/2021)   Received from [facility]   Social Network    Social Network: Not on file  Depression (PHQ2-9): Low Risk (09/05/2023)   Depression (PHQ2-9)    PHQ-2 Score: 3  Alcohol Screen: Not on file  Housing: Low Risk (08/14/2023)   Received from [facility]   Epic    What is your living situation today?: I have a steady place to live    Think about the place you live. Do you have problems with any of the following? Choose all that apply:: Not on file  Utilities: Low Risk (08/14/2023)   Received from [facility]   Utilities    In the past 12 months has the electric, gas, oil, or water  company threatened to shut off services in your home? : No  Health Literacy: Not on file   ROS See pertinent in HPI. All other systems  reviewed and non contributory  GENERAL: Well-developed, well-nourished female in no acute distress.   ABDOMEN: Soft, nontender, nondistended. No organomegaly. PELVIC: Normal external female genitalia. Vagina is pink and rugated.  Normal discharge. Normal appearing cervix. Uterus is normal in size. No adnexal mass or tenderness. Chaperone present during the pelvic exam EXTREMITIES: No cyanosis, clubbing, or edema, 2+ distal pulses.  November CT scan report from [facility] ABDOMEN PELVIS W CONTRAST, 05/22/2024 5:58 PM   INDICATION: hx  of volvulus requiring small/large bowel resection. having difficulty w bowel movements, scant diarrhea, intermittent pain/cramping  COMPARISON: CT abdomen pelvis 12/20/2023.   TECHNIQUE: CT images of the abdomen and pelvis were obtained after intravenous administration of iodinated contrast. Conventional axial reconstructions and multiplanar reformatted images were submitted for review.   FINDINGS:   . Lower Chest: Partially imaged right breast implant.   . Liver: No suspicious focal findings.  . Gallbladder/Biliary: Unremarkable.  SABRA Spleen: Unremarkable.  . Pancreas: Unremarkable.  . Adrenals: Unremarkable.  . Kidneys: Unchanged small right cyst.   . Peritoneum/Mesenteries/Extraperitoneum: No free air. No free fluid or loculated drainable collection. No pathologically enlarged lymph nodes.  . Gastrointestinal tract: Postsurgical changes of ileocecectomy. Short segment wall thickening of the ascending colon at the hepatic flexure, though this is also decompressed.   . Ureters: Unremarkable.  . Bladder: Unremarkable.  . Reproductive System: Ill-defined appearance to the endometrium and junctional zone, estimated at 10 mm in thickness.   . Vascular: Circumaortic left renal vein.  . Musculoskeletal: No acute displaced fractures. Remote rib fractures. Transitional lumbosacral anatomy with a partially sacralized left L5 transverse process. No  aggressive focal bony lesions. InterStim device and lead. Stimulator pack in the left abdominal wall with leads coursing superiorly to the field-of-view.   IMPRESSION:   1. Evidence suggestive of a nonspecific colitis at the hepatic flexure.  2.  Expected postsurgical changes of ileocecectomy. No current signs of bowel obstruction or other major complication.  3.  Query thickened/effaced appearance of the endometrium, not a definite finding by CT. If there are any concerning clinical symptoms, then recommend outpatient assessment by Gynecology as needed.   A/P 59 yo postmenopausal with thickened endometrium on CT scan - reviewed CT scan findings and discussed pelvic ultrasound as better radiologic study to evaluate the endometrium. Patient agreed to pelvic ultrasound - ENDOMETRIAL BIOPSY     The indications for endometrial biopsy were reviewed.   Risks of the biopsy including cramping, bleeding, infection, uterine perforation, inadequate specimen and need for additional procedures  were discussed. The patient states she understands and agrees to undergo procedure today. Consent was signed. Time out was performed. Urine HCG was negative. A sterile speculum was placed in the patient's vagina and the cervix was prepped with Betadine. A single-toothed tenaculum was placed on the anterior lip of the cervix to stabilize it. The uterine cavity was sounded to a depth of 9 cm using the uterine sound. The 3 mm pipelle was introduced into the endometrial cavity without difficulty, two passes were made.  A  small amount of tissue was  sent to pathology. The instruments were removed from the patient's vagina. Minimal bleeding from the cervix was noted. The patient tolerated the procedure well.  Routine post-procedure instructions were given to the patient. The patient will follow up in two weeks to review the results and for further management.

## 2024-07-16 ENCOUNTER — Telehealth: Payer: Self-pay | Admitting: "Endocrinology

## 2024-07-16 NOTE — Telephone Encounter (Signed)
 Patient states her insurance does not cover Reclast . Please Advise

## 2024-07-17 ENCOUNTER — Ambulatory Visit: Payer: Self-pay | Admitting: Obstetrics and Gynecology

## 2024-07-17 LAB — SURGICAL PATHOLOGY

## 2024-07-21 ENCOUNTER — Ambulatory Visit (HOSPITAL_COMMUNITY)

## 2024-07-22 ENCOUNTER — Ambulatory Visit

## 2024-07-22 VITALS — BP 100/63 | HR 82 | Temp 97.7°F | Ht 63.0 in | Wt 122.0 lb

## 2024-07-22 DIAGNOSIS — G20A1 Parkinson's disease without dyskinesia, without mention of fluctuations: Secondary | ICD-10-CM

## 2024-07-22 DIAGNOSIS — F419 Anxiety disorder, unspecified: Secondary | ICD-10-CM

## 2024-07-22 DIAGNOSIS — K219 Gastro-esophageal reflux disease without esophagitis: Secondary | ICD-10-CM | POA: Diagnosis not present

## 2024-07-22 DIAGNOSIS — Z23 Encounter for immunization: Secondary | ICD-10-CM | POA: Diagnosis not present

## 2024-07-22 DIAGNOSIS — Z1231 Encounter for screening mammogram for malignant neoplasm of breast: Secondary | ICD-10-CM

## 2024-07-22 DIAGNOSIS — F32A Depression, unspecified: Secondary | ICD-10-CM

## 2024-07-22 DIAGNOSIS — M818 Other osteoporosis without current pathological fracture: Secondary | ICD-10-CM

## 2024-07-22 DIAGNOSIS — R5383 Other fatigue: Secondary | ICD-10-CM | POA: Diagnosis not present

## 2024-07-22 DIAGNOSIS — Z0001 Encounter for general adult medical examination with abnormal findings: Secondary | ICD-10-CM

## 2024-07-22 DIAGNOSIS — Z0000 Encounter for general adult medical examination without abnormal findings: Secondary | ICD-10-CM

## 2024-07-22 DIAGNOSIS — R1010 Upper abdominal pain, unspecified: Secondary | ICD-10-CM

## 2024-07-22 DIAGNOSIS — Z113 Encounter for screening for infections with a predominantly sexual mode of transmission: Secondary | ICD-10-CM

## 2024-07-22 DIAGNOSIS — Z13220 Encounter for screening for lipoid disorders: Secondary | ICD-10-CM

## 2024-07-22 MED ORDER — ESOMEPRAZOLE MAGNESIUM 40 MG PO CPDR: DELAYED_RELEASE_CAPSULE | ORAL | 3 refills | Status: AC

## 2024-07-22 MED ORDER — ALPRAZOLAM 1 MG PO TABS: 1.5000 mg | ORAL_TABLET | Freq: Every day | ORAL | 0 refills | Status: AC

## 2024-07-22 NOTE — Assessment & Plan Note (Signed)
-   Patient advised to continue following up with neurology regularly for this.

## 2024-07-22 NOTE — Assessment & Plan Note (Signed)
-   GERD symptoms exacerbated due to running out of prescription-strength medication. Over-the-counter options ineffective. - Prescribed esomeprazole  (Nexium ) for GERD management. - Sent prescription to pharmacy.

## 2024-07-22 NOTE — Progress Notes (Signed)
 "  New Patient Office Visit  Subjective    Patient ID: Kristina Lewis, female    DOB: July 02, 1966  Age: 59 y.o. MRN: [id number]  HPI [name] presents to establish care and for annual physical.   Discussed the use of AI scribe software for clinical note transcription with the patient, who gave verbal consent to proceed.  In June 2025, she was hospitalized for severe abdominal pain and underwent emergency surgery for a twisted intestine at the junction of the small and large intestines. A section of the intestine and a valve were removed, leading to ongoing gastrointestinal issues, including small intestine bacterial overgrowth (SIBO). She is currently taking Xifaxan for 28 days to manage this condition and Creon  for pancreatic enzyme replacement. She experiences weight loss and lack of appetite during bacterial overgrowth episodes.  The patient comes in today for a wellness visit.  A review of their health history was completed. A review of medications was also completed.  Any needed refills; Xanax  and Nexium   Eating habits: Good  Falls/  MVA accidents in past few months: No  Regular exercise: Walks daily.   Sleep: Okay, takes Xanax  to help her fall asleep.   Menstrual cycles/sexual history: She underwent a Pap smear and cervical biopsy at gynecology due to a thickening in her cervix, which returned normal results. She is not currently menstruating and has not had a period in eight years.  Not currently sexually active.  Specialist pt sees on regular basis: GI, neurology, psychiatry, gynecology and endocrinology.  She has early onset Parkinson's disease with symptoms such as falls and dyskinesia. She follows up regularly with neurology. Prozac previously exacerbated her dyskinesia, and she is currently not taking any psychiatric medications except Xanax  at night for sleep.  Has been taking Xanax  at night for sleep for years.  She experiences anxiety and depression. Has a follow-up  appointment with psychiatry in February, as she sees them every 3 months.  She has a history of osteoporosis, confirmed by a bone density scan, and follows up with an endocrinologist. She takes vitamin D3 supplements. Osteoporosis is prevalent in her family. Currently follows up with GI regularly, and last saw them in October 2025.  Regular eye/dental exams: Yes  Preventative health issues were discussed.   Additional concerns: She has a history of GERD and requires prescription-strength Nexium , as over-the-counter medications have been ineffective.  Would like a refill on her prescription strength Nexium .   Also has concerns regarding a mammogram.  Has breast implants and says that one of her implants is leaking, and is concerned about getting a mammogram because of this.   Past Medical History:  Diagnosis Date   Anxiety    Atypical chest pain 03/15/2017   Bulging disc    CERVICAL   GERD (gastroesophageal reflux disease)    Hemorrhoids    IC (interstitial cystitis)    bladder stimulator 10/2012   Interstitial cystitis    Left leg pain    Migraines    Palpitations 03/15/2017   Parkinson's disease (HCC)    deep brain stimulator placed   PONV (postoperative nausea and vomiting)    PVC (premature ventricular contraction) 04/04/2017   Seasonal allergies    Urge incontinence     Past Surgical History:  Procedure Laterality Date   BIOPSY N/A 10/27/2014   Procedure: BIOPSY;  Surgeon: Lamar CHRISTELLA Hollingshead, MD;  Location: [facility];  Service: Endoscopy;  Laterality: N/A;  duodenal, esophageal   BREAST ENHANCEMENT SURGERY  AGE  25   CESAREAN SECTION  12/2000  &  01-26-2003   X2   COLON SURGERY  12/20/2023   COLONOSCOPY WITH PROPOFOL  N/A 10/27/2014   Dr. Shaaron: Minimal internal heorrhoids;otherwise normal rectum, colon and terminal ileum.    COLONOSCOPY WITH PROPOFOL  N/A 09/14/2022   Procedure: COLONOSCOPY WITH PROPOFOL ;  Surgeon: Shaaron Lamar HERO, MD;  Location: [facility];  Service:  Endoscopy;  Laterality: N/A;  145pm, asa 3, Has deep brain stimulator and urinary bladder stimulator   cranial neurostimulator implant  04/2018   for deep brain simulation. [facility]   CYSTO/ URETHRAL DILATION/ HOD  X2  LAST ONE 12-19-2004   ESOPHAGEAL DILATION N/A 10/27/2014   Procedure: ESOPHAGEAL DILATION;  Surgeon: Lamar HERO Shaaron, MD;  Location: [facility];  Service: Endoscopy;  Laterality: N/AMERL [address] 52   ESOPHAGOGASTRODUODENOSCOPY (EGD) WITH PROPOFOL  N/A 10/27/2014   Dr. Shaaron: Normal EGD. status post maloney dilation follwed by esophageal and duodenal biopsy. benign esophageal and duodenal biopsies   ESOPHAGOGASTRODUODENOSCOPY (EGD) WITH PROPOFOL  N/A 09/14/2022   Procedure: ESOPHAGOGASTRODUODENOSCOPY (EGD) WITH PROPOFOL ;  Surgeon: Shaaron Lamar HERO, MD;  Location: [facility];  Service: Endoscopy;  Laterality: N/A;   HEMORRHOID BANDING  2016   Dr.Rourk   INTERSTIM IMPLANT PLACEMENT N/A 10/08/2012   Procedure: RENNA IMPLANT FIRST STAGE;  Surgeon: Glendia DELENA Elizabeth, MD;  Location: [facility];  Service: Urology;  Laterality: N/A;   INTERSTIM IMPLANT PLACEMENT N/A 10/08/2012   Procedure: RENNA IMPLANT SECOND STAGE;  Surgeon: Glendia DELENA Elizabeth, MD;  Location: [facility] [place];  Service: Urology;  Laterality: N/A;   LASIK     bilateral   MALONEY DILATION N/A 09/14/2022   Procedure: STAI DILATION;  Surgeon: Shaaron Lamar HERO, MD;  Location: [facility];  Service: Endoscopy;  Laterality: N/A;   TONSILLECTOMY  AGE 32'S    Family History  Problem Relation Age of Onset   Diabetes Mother        Died, 3   Colon polyps Mother        less than age 10   Diabetes Father        Living, 66   Cancer Father        prostate   Hypertension Father    Atrial fibrillation Father    CAD Father    Hypertension Maternal Grandmother    Heart disease Maternal Grandmother    Diabetes Paternal Grandfather    Breast cancer Maternal Aunt    Cancer Cousin        MATERNAL    Stroke Paternal Grandmother    Colon cancer Neg Hx     Social History   Socioeconomic History   Marital status: Divorced    Spouse name: Not on file   Number of children: 3   Years of education: Not on file   Highest education level: Not on file  Occupational History   Not on file  Tobacco Use   Smoking status: Never    Passive exposure: Never   Smokeless tobacco: Never  Vaping Use   Vaping status: Never Used  Substance and Sexual Activity   Alcohol use: Yes    Comment: occasionally   Drug use: No   Sexual activity: Not Currently    Birth control/protection: None  Other Topics Concern   Not on file  Social History Narrative   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  She had three healthy children at home.  Husband is programmer, multimedia.     Social Drivers of Health   Tobacco  Use: Low Risk (07/22/2024)   Patient History    Smoking Tobacco Use: Never    Smokeless Tobacco Use: Never    Passive Exposure: Never  Financial Resource Strain: Not on file  Food Insecurity: Low Risk (08/14/2023)   Received from [facility]   Epic    Within the past 12 months, you worried that your food would run out before you got money to buy more: Never true    Within the past 12 months, the food you bought just didn't last and you didn't have money to get more. : Never true  Transportation Needs: No Transportation Needs (08/14/2023)   Received from [facility]    In the past 12 months, has lack of reliable transportation kept you from medical appointments, meetings, work or from getting things needed for daily living? : No  Physical Activity: Not on file  Stress: Not on file  Social Connections: Unknown (11/13/2021)   Received from [facility]   Social Network    Social Network: Not on file  Intimate Partner Violence: Unknown (10/05/2021)   Received from [facility]   HITS    Physically Hurt: Not on file    Insult or Talk Down To: Not on file    Threaten Physical Harm: Not on file    Scream or  Curse: Not on file  Depression (PHQ2-9): High Risk (07/22/2024)   Depression (PHQ2-9)    PHQ-2 Score: 15  Alcohol Screen: Not on file  Housing: Low Risk (08/14/2023)   Received from [facility]   Epic    What is your living situation today?: I have a steady place to live    Think about the place you live. Do you have problems with any of the following? Choose all that apply:: Not on file  Utilities: Low Risk (08/14/2023)   Received from [facility]   Utilities    In the past 12 months has the electric, gas, oil, or water  company threatened to shut off services in your home? : No  Health Literacy: Not on file    Review of Systems  Constitutional:  Negative for fatigue and fever.  HENT:  Negative for sore throat.   Eyes:  Negative for visual disturbance.  Respiratory:  Negative for cough, chest tightness, shortness of breath and wheezing.   Cardiovascular:  Negative for chest pain.  Gastrointestinal:  Negative for abdominal pain, constipation, diarrhea, nausea and vomiting.  Genitourinary:  Negative for difficulty urinating, vaginal bleeding and vaginal discharge.  Neurological:  Negative for dizziness, seizures and headaches.  Psychiatric/Behavioral:  Positive for sleep disturbance. Negative for behavioral problems and decreased concentration. The patient is nervous/anxious.    Objective    BP 100/63   Pulse 82   Temp 97.7 F (36.5 C)   Ht 5' 3 (1.6 m)   Wt 122 lb (55.3 kg)   LMP 04/07/2016   SpO2 99%   BMI 21.61 kg/m   Physical Exam Vitals and nursing note reviewed.  Constitutional:      General: She is not in acute distress.    Appearance: Normal appearance. She is not ill-appearing.  Cardiovascular:     Rate and Rhythm: Normal rate and regular rhythm.     Heart sounds: Normal heart sounds, S1 normal and S2 normal. No murmur heard. Pulmonary:     Effort: Pulmonary effort is normal. No respiratory distress.     Breath sounds: Normal breath sounds. No wheezing.   Abdominal:  General: There is no distension.     Palpations: Abdomen is soft.     Tenderness: There is no abdominal tenderness.     Hernia: No hernia is present.     Comments: No obvious masses or abnormalities palpated on exam.   Musculoskeletal:     Right lower leg: No edema.     Left lower leg: No edema.  Lymphadenopathy:     Cervical: No cervical adenopathy.  Skin:    General: Skin is warm and dry.  Neurological:     Mental Status: She is alert. Mental status is at baseline.  Psychiatric:        Mood and Affect: Mood normal.        Behavior: Behavior normal.        Thought Content: Thought content normal.        Judgment: Judgment normal.       07/22/2024   11:06 AM 09/05/2023   10:26 AM  Depression screen PHQ 2/9  Decreased Interest 2 1  Down, Depressed, Hopeless 2 1  PHQ - 2 Score 4 2  Altered sleeping 2 1  Tired, decreased energy 2 0  Change in appetite 2 0  Feeling bad or failure about yourself  2 0  Trouble concentrating 1 0  Moving slowly or fidgety/restless 2 0  Suicidal thoughts 0 0  PHQ-9 Score 15 3   Difficult doing work/chores Somewhat difficult Not difficult at all     Data saved with a previous flowsheet row definition       07/22/2024   11:07 AM  GAD 7 : Generalized Anxiety Score  Nervous, Anxious, on Edge 2  Control/stop worrying 2  Worry too much - different things 2  Trouble relaxing 2  Restless 1  Easily annoyed or irritable 1  Afraid - awful might happen 2  Total GAD 7 Score 12  Anxiety Difficulty Not difficult at all   Assessment & Plan:  1. Well woman exam without gynecological exam (Primary) Adult wellness-complete.wellness physical was conducted today. Importance of diet and exercise were discussed in detail.  Importance of stress reduction and healthy living were discussed.  In addition to this a discussion regarding safety was also covered.  We also reviewed over immunizations and gave recommendations regarding current  immunization needed for age.   In addition to this additional areas were also touched on including: Preventative health exams needed: Mammogram  Patient was advised yearly wellness exam   2. Gastroesophageal reflux disease, unspecified whether esophagitis present - GERD symptoms exacerbated due to running out of prescription-strength medication. Over-the-counter options ineffective. - Prescribed esomeprazole  (Nexium ) for GERD management. - Sent prescription to pharmacy.  3. Parkinson's disease, unspecified whether dyskinesia present, unspecified whether manifestations fluctuate (HCC) - Patient advised to continue following up with neurology regularly for this.  4. Anxiety and depression - Managed with Xanax  for sleep. Prozac discontinued due to dyskinesia. Cymbalta ineffective. Anxiety and depression may relate to early onset Parkinson's disease. Psychiatry follow-up ongoing. Hesitant to start new medications due to side effects. - Provided bridge prescription for Xanax  until psychiatry appointment in February, with a plan for psychiatry to take this prescription over. Patient agrees. - Encouraged follow-up with psychiatry for ongoing management of anxiety and depression.  5. Other osteoporosis without current pathological fracture -Patient to continue following up with endocrinology for this.   6. Screen for STD (sexually transmitted disease) - HIV Antibody (routine testing w rflx) - Hepatitis C antibody  7. Encounter for screening mammogram  for malignant neoplasm of breast - Concerns about breast implant rupture, particularly the left implant. Previous mammograms required ultrasound due to implants. Apprehensive about mammogram procedures due to implant concerns. - Consulted with women's health specialist regarding appropriate imaging for breast implants. - Recommended yearly mammograms with appropriate imaging protocol for breast implants. - MM 3D SCREENING MAMMOGRAM BILATERAL  BREAST W/IMPLANT  8. Screening for lipid disorders - Lipid panel  9. Fatigue, unspecified type - Comprehensive metabolic panel with GFR - CBC with Differential/Platelet - TSH - T4, free  10. Immunization due - Pneumococcal conjugate vaccine 20-valent (Prevnar 20)   Meds ordered this encounter  Medications   esomeprazole  (NEXIUM ) 40 MG capsule    Sig: TAKE 1 CAPSULE DAILY BEFORE BREAKFAST    Dispense:  90 capsule    Refill:  3   ALPRAZolam  (XANAX ) 1 MG tablet    Sig: Take 1.5 tablets (1.5 mg total) by mouth at bedtime.    Dispense:  45 tablet    Refill:  0    Can fill on 07/26/24.      Return in about 6 months (around 01/19/2025).   Damien KATHEE Pringle, FNP  Note:  This document was prepared using Dragon voice recognition software and may include unintentional dictation errors.   "

## 2024-07-22 NOTE — Assessment & Plan Note (Signed)
-   Managed with Xanax  for sleep. Prozac discontinued due to dyskinesia. Cymbalta ineffective. Anxiety and depression may relate to early onset Parkinson's disease. Psychiatry follow-up ongoing. Hesitant to start new medications due to side effects. - Provided bridge prescription for Xanax  until psychiatry appointment in February, with a plan for psychiatry to take this prescription over. Patient agrees. - Encouraged follow-up with psychiatry for ongoing management of anxiety and depression.

## 2024-07-22 NOTE — Patient Instructions (Signed)
 Please call [facility]/Bone density scheduling [phone]

## 2024-07-23 ENCOUNTER — Ambulatory Visit (HOSPITAL_BASED_OUTPATIENT_CLINIC_OR_DEPARTMENT_OTHER)

## 2024-08-26 ENCOUNTER — Ambulatory Visit: Admitting: Nurse Practitioner

## 2024-08-27 ENCOUNTER — Ambulatory Visit: Admitting: "Endocrinology

## 2024-09-05 ENCOUNTER — Ambulatory Visit

## 2025-01-19 ENCOUNTER — Ambulatory Visit
